# Patient Record
Sex: Female | Born: 1950
Health system: Southern US, Community
[De-identification: ages and names within clinical notes are randomized; demographics above are authoritative.]

## PROBLEM LIST (undated history)

## (undated) DIAGNOSIS — R7303 Prediabetes: Secondary | ICD-10-CM

## (undated) DIAGNOSIS — D369 Benign neoplasm, unspecified site: Secondary | ICD-10-CM

## (undated) DIAGNOSIS — G4730 Sleep apnea, unspecified: Secondary | ICD-10-CM

## (undated) DIAGNOSIS — E785 Hyperlipidemia, unspecified: Secondary | ICD-10-CM

## (undated) DIAGNOSIS — F329 Major depressive disorder, single episode, unspecified: Secondary | ICD-10-CM

## (undated) DIAGNOSIS — F32A Depression, unspecified: Secondary | ICD-10-CM

## (undated) HISTORY — DX: Prediabetes: R73.03

## (undated) HISTORY — DX: Major depressive disorder, single episode, unspecified: F32.9

## (undated) HISTORY — DX: Depression, unspecified: F32.A

## (undated) HISTORY — DX: Benign neoplasm, unspecified site: D36.9

## (undated) HISTORY — DX: Sleep apnea, unspecified: G47.30

## (undated) HISTORY — DX: Hyperlipidemia, unspecified: E78.5

---

## 2002-03-13 HISTORY — PX: TUBAL LIGATION: SHX77

## 2005-06-22 ENCOUNTER — Other Ambulatory Visit: Admission: RE | Admit: 2005-06-22 | Discharge: 2005-06-22 | Payer: Self-pay | Admitting: Internal Medicine

## 2005-07-11 ENCOUNTER — Encounter: Admission: RE | Admit: 2005-07-11 | Discharge: 2005-07-11 | Payer: Self-pay | Admitting: Internal Medicine

## 2006-06-29 ENCOUNTER — Other Ambulatory Visit: Admission: RE | Admit: 2006-06-29 | Discharge: 2006-06-29 | Payer: Self-pay | Admitting: Internal Medicine

## 2007-07-01 ENCOUNTER — Other Ambulatory Visit: Admission: RE | Admit: 2007-07-01 | Discharge: 2007-07-01 | Payer: Self-pay | Admitting: Internal Medicine

## 2007-08-12 ENCOUNTER — Encounter: Admission: RE | Admit: 2007-08-12 | Discharge: 2007-08-12 | Payer: Self-pay | Admitting: Internal Medicine

## 2008-01-20 ENCOUNTER — Ambulatory Visit: Payer: Self-pay | Admitting: Internal Medicine

## 2008-06-30 ENCOUNTER — Ambulatory Visit: Payer: Self-pay | Admitting: Internal Medicine

## 2009-01-15 ENCOUNTER — Encounter: Admission: RE | Admit: 2009-01-15 | Discharge: 2009-01-15 | Payer: Self-pay | Admitting: Internal Medicine

## 2009-01-27 ENCOUNTER — Encounter: Admission: RE | Admit: 2009-01-27 | Discharge: 2009-01-27 | Payer: Self-pay | Admitting: Internal Medicine

## 2009-02-09 ENCOUNTER — Other Ambulatory Visit: Admission: RE | Admit: 2009-02-09 | Discharge: 2009-02-09 | Payer: Self-pay | Admitting: Internal Medicine

## 2009-02-09 ENCOUNTER — Ambulatory Visit: Payer: Self-pay | Admitting: Internal Medicine

## 2009-06-29 ENCOUNTER — Ambulatory Visit: Payer: Self-pay | Admitting: Internal Medicine

## 2009-08-17 ENCOUNTER — Ambulatory Visit: Payer: Self-pay | Admitting: Internal Medicine

## 2010-02-22 ENCOUNTER — Ambulatory Visit: Payer: Self-pay | Admitting: Internal Medicine

## 2010-02-28 ENCOUNTER — Encounter
Admission: RE | Admit: 2010-02-28 | Discharge: 2010-02-28 | Payer: Self-pay | Source: Home / Self Care | Attending: Internal Medicine | Admitting: Internal Medicine

## 2010-03-21 HISTORY — PX: COLONOSCOPY: SHX174

## 2010-04-03 ENCOUNTER — Encounter: Payer: Self-pay | Admitting: Internal Medicine

## 2010-08-26 ENCOUNTER — Ambulatory Visit (INDEPENDENT_AMBULATORY_CARE_PROVIDER_SITE_OTHER): Payer: 59 | Admitting: Internal Medicine

## 2010-08-26 ENCOUNTER — Encounter: Payer: Self-pay | Admitting: Internal Medicine

## 2010-08-26 VITALS — BP 116/76 | HR 80 | Temp 98.4°F | Ht 64.0 in | Wt 143.5 lb

## 2010-08-26 DIAGNOSIS — H6120 Impacted cerumen, unspecified ear: Secondary | ICD-10-CM

## 2010-08-26 DIAGNOSIS — E559 Vitamin D deficiency, unspecified: Secondary | ICD-10-CM

## 2010-08-26 DIAGNOSIS — E785 Hyperlipidemia, unspecified: Secondary | ICD-10-CM

## 2010-08-26 NOTE — Patient Instructions (Signed)
Call if symptoms persist

## 2010-08-26 NOTE — Progress Notes (Signed)
  Subjective:    Patient ID: Dawn Herrera, female    DOB: 08-19-1950, 60 y.o.   MRN: [id number]  HPI Prior history of impacted cerumen in ears. Now c/o decreased hearing both ears right worse than left. C/o of sinus drainage.    Review of Systems     Objective:   Physical ExamImpacted cerumen right ear removed with currette. Slight beleding afterward in external canal. Cotton placed in ear. TMs chronically scarred.Cerumen removed from left external canal as well but not as much as right ear.       Assessment & Plan:  Impacted cerumen  Possible Otitis media  Plan Given script for Augmentin 500 mg po tid for 10 days should ear sxs persist.

## 2010-10-04 ENCOUNTER — Other Ambulatory Visit: Payer: 59 | Admitting: Internal Medicine

## 2010-10-04 DIAGNOSIS — E785 Hyperlipidemia, unspecified: Secondary | ICD-10-CM

## 2010-10-04 DIAGNOSIS — Z79899 Other long term (current) drug therapy: Secondary | ICD-10-CM

## 2010-10-04 DIAGNOSIS — Z0000 Encounter for general adult medical examination without abnormal findings: Secondary | ICD-10-CM

## 2010-10-04 LAB — COMPREHENSIVE METABOLIC PANEL
ALT: 20 U/L (ref 0–35)
AST: 25 U/L (ref 0–37)
Creat: 0.81 mg/dL (ref 0.50–1.10)
Total Bilirubin: 0.5 mg/dL (ref 0.3–1.2)

## 2010-10-04 LAB — LIPID PANEL
HDL: 55 mg/dL (ref >39)
LDL Cholesterol: 88 mg/dL (ref 0–99)
Triglycerides: 203 mg/dL — ABNORMAL HIGH (ref <150)
VLDL: 41 mg/dL — ABNORMAL HIGH (ref 0–40)

## 2010-10-04 LAB — CBC WITH DIFFERENTIAL/PLATELET
Basophils Absolute: 0 10*3/uL (ref 0.0–0.1)
Basophils Relative: 0 % (ref 0–1)
Hemoglobin: 15.2 g/dL — ABNORMAL HIGH (ref 12.0–15.0)
MCHC: 32.4 g/dL (ref 30.0–36.0)
Neutro Abs: 3.9 10*3/uL (ref 1.7–7.7)
Neutrophils Relative %: 56 % (ref 43–77)
RDW: 14.8 % (ref 11.5–15.5)

## 2010-10-04 LAB — TSH: TSH: 2.964 u[IU]/mL (ref 0.350–4.500)

## 2010-10-05 LAB — VITAMIN D 25 HYDROXY (VIT D DEFICIENCY, FRACTURES): Vit D, 25-Hydroxy: 48 ng/mL (ref 30–89)

## 2010-10-06 ENCOUNTER — Encounter: Payer: Self-pay | Admitting: Internal Medicine

## 2010-10-06 ENCOUNTER — Ambulatory Visit (INDEPENDENT_AMBULATORY_CARE_PROVIDER_SITE_OTHER): Payer: 59 | Admitting: Internal Medicine

## 2010-10-06 VITALS — BP 116/82 | HR 80 | Temp 97.7°F | Ht 63.25 in | Wt 141.0 lb

## 2010-10-06 DIAGNOSIS — Z860100 Personal history of colon polyps, unspecified: Secondary | ICD-10-CM

## 2010-10-06 DIAGNOSIS — B9689 Other specified bacterial agents as the cause of diseases classified elsewhere: Secondary | ICD-10-CM

## 2010-10-06 DIAGNOSIS — Z87891 Personal history of nicotine dependence: Secondary | ICD-10-CM

## 2010-10-06 DIAGNOSIS — E559 Vitamin D deficiency, unspecified: Secondary | ICD-10-CM

## 2010-10-06 DIAGNOSIS — Z8659 Personal history of other mental and behavioral disorders: Secondary | ICD-10-CM

## 2010-10-06 DIAGNOSIS — E785 Hyperlipidemia, unspecified: Secondary | ICD-10-CM

## 2010-10-06 DIAGNOSIS — N390 Urinary tract infection, site not specified: Secondary | ICD-10-CM

## 2010-10-06 DIAGNOSIS — A499 Bacterial infection, unspecified: Secondary | ICD-10-CM

## 2010-10-06 DIAGNOSIS — R319 Hematuria, unspecified: Secondary | ICD-10-CM

## 2010-10-06 DIAGNOSIS — Z0000 Encounter for general adult medical examination without abnormal findings: Secondary | ICD-10-CM

## 2010-10-06 LAB — POCT URINALYSIS DIPSTICK
Bilirubin, UA: NEGATIVE
Ketones, UA: NEGATIVE
Leukocytes, UA: NEGATIVE
pH, UA: 6.5

## 2010-10-07 LAB — URINALYSIS, ROUTINE W REFLEX MICROSCOPIC
Hgb urine dipstick: NEGATIVE
Leukocytes, UA: NEGATIVE
Nitrite: NEGATIVE
Urobilinogen, UA: 0.2 mg/dL (ref 0.0–1.0)
pH: 6.5 (ref 5.0–8.0)

## 2010-10-08 ENCOUNTER — Other Ambulatory Visit: Payer: Self-pay | Admitting: Internal Medicine

## 2010-10-08 DIAGNOSIS — N390 Urinary tract infection, site not specified: Secondary | ICD-10-CM

## 2010-10-08 LAB — URINE CULTURE

## 2010-10-08 MED ORDER — LEVOFLOXACIN 500 MG PO TABS: 500.0000 mg | ORAL_TABLET | Freq: Every day | ORAL | Status: AC

## 2010-10-10 ENCOUNTER — Encounter: Payer: Self-pay | Admitting: Internal Medicine
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
## 2010-10-11 ENCOUNTER — Encounter: Payer: Self-pay | Admitting: Internal Medicine

## 2010-10-11 DIAGNOSIS — Z86010 Personal history of colonic polyps: Secondary | ICD-10-CM | POA: Insufficient documentation

## 2010-10-11 DIAGNOSIS — E785 Hyperlipidemia, unspecified: Secondary | ICD-10-CM | POA: Insufficient documentation

## 2010-10-11 DIAGNOSIS — A499 Bacterial infection, unspecified: Secondary | ICD-10-CM | POA: Insufficient documentation

## 2010-10-11 DIAGNOSIS — Z87891 Personal history of nicotine dependence: Secondary | ICD-10-CM | POA: Insufficient documentation

## 2010-10-11 DIAGNOSIS — N390 Urinary tract infection, site not specified: Secondary | ICD-10-CM | POA: Insufficient documentation

## 2010-10-24 ENCOUNTER — Encounter: Payer: Self-pay | Admitting: Internal Medicine

## 2010-10-24 ENCOUNTER — Ambulatory Visit (INDEPENDENT_AMBULATORY_CARE_PROVIDER_SITE_OTHER): Payer: PRIVATE HEALTH INSURANCE | Admitting: Internal Medicine

## 2010-10-24 VITALS — Temp 97.6°F

## 2010-10-24 DIAGNOSIS — N390 Urinary tract infection, site not specified: Secondary | ICD-10-CM

## 2010-10-24 LAB — POCT URINALYSIS DIPSTICK
Bilirubin, UA: NEGATIVE
Glucose, UA: NEGATIVE
Ketones, UA: NEGATIVE
Leukocytes, UA: NEGATIVE
Nitrite, UA: NEGATIVE
Protein, UA: NEGATIVE
Spec Grav, UA: 1.005
Urobilinogen, UA: NEGATIVE
pH, UA: 6

## 2010-10-24 NOTE — Progress Notes (Signed)
Pt in today for a follow-up of an UTI. U/A dip WNL. Instructed pt to contact office for any further needs or concerns.

## 2011-01-10 ENCOUNTER — Encounter: Payer: Self-pay | Admitting: Pulmonary Disease

## 2011-01-10 ENCOUNTER — Ambulatory Visit (INDEPENDENT_AMBULATORY_CARE_PROVIDER_SITE_OTHER): Payer: PRIVATE HEALTH INSURANCE | Admitting: Pulmonary Disease

## 2011-01-10 ENCOUNTER — Telehealth: Payer: Self-pay | Admitting: Pulmonary Disease

## 2011-01-10 VITALS — BP 106/72 | HR 72 | Temp 98.3°F | Ht 64.0 in | Wt 144.4 lb

## 2011-01-10 DIAGNOSIS — G4733 Obstructive sleep apnea (adult) (pediatric): Secondary | ICD-10-CM

## 2011-01-10 NOTE — Progress Notes (Signed)
  Subjective:    Patient ID: Dawn Herrera, female    DOB: [birth year], 60 y.o.   MRN: [id number]  HPI The patient is a 60 year old female who I've been asked to see for possible sleep apnea.  She is been noted by her husband to have loud snoring, as well as an abnormal breathing pattern during sleep.  This was again mentioned after sharing a room with friends.  The patient describes nonrestorative sleep in the mornings upon arising, but also states that she is not "a morning person".  She stays very busy while at work, and does not have a lot of opportunity to become sleepy.  However, while at home on the weekends, she will become very sleepy anytime that she sits down.  She also dozes in the evening while trying to watch TV or movies.  She denies any issues with sleepiness while driving.  The patient states that her weight has been stable over the last few years, and her Epworth sleepiness score today is 12.  Sleep Questionnaire: What time do you typically go to bed?( Between what hours) 10 to 11:00 How long does it take you to fall asleep? 15 to 30 mins How many times during the night do you wake up? 2 What time do you get out of bed to start your day? 0715 Do you drive or operate heavy machinery in your occupation? No How much has your weight changed (up or down) over the past two years? (In pounds) 5 lb (2.268 kg) Have you ever had a sleep study before? No Do you currently use CPAP? No Do you wear oxygen at any time? No    Review of Systems  Constitutional: Negative for fever and unexpected weight change.  HENT: Positive for sneezing. Negative for ear pain, nosebleeds, congestion, sore throat, rhinorrhea, trouble swallowing, dental problem, postnasal drip and sinus pressure.   Eyes: Negative for redness and itching.  Respiratory: Positive for cough. Negative for chest tightness, shortness of breath and wheezing.   Cardiovascular: Positive for palpitations. Negative for leg swelling.    Gastrointestinal: Negative for nausea and vomiting.  Genitourinary: Negative for dysuria.  Musculoskeletal: Negative for joint swelling.  Skin: Negative for rash.  Neurological: Negative for headaches.  Hematological: Does not bruise/bleed easily.  Psychiatric/Behavioral: Negative for dysphoric mood. The patient is not nervous/anxious.        Objective:   Physical Exam Constitutional:  Well developed, no acute distress  HENT:  Nares patent without discharge, but very narrow bilat  Oropharynx without exudate, palate and uvula are elongated with narrowing posteriorly   Eyes:  Perrla, eomi, no scleral icterus  Neck:  No JVD, no TMG  Cardiovascular:  Normal rate, regular rhythm, no rubs or gallops.  No murmurs        Intact distal pulses  Pulmonary :  Normal breath sounds, no stridor or respiratory distress   No rales, rhonchi, or wheezing  Abdominal:  Soft, nondistended, bowel sounds present.  No tenderness noted.   Musculoskeletal:  No lower extremity edema noted.  Lymph Nodes:  No cervical lymphadenopathy noted  Skin:  No cyanosis noted  Neurologic:  Alert, appropriate, moves all 4 extremities without obvious deficit.         Assessment & Plan:

## 2011-01-10 NOTE — Assessment & Plan Note (Signed)
The patient's history is suggestive of obstructive sleep apnea.  She has loud snoring, witnessed apnea while sleeping, and clearly has abnormal daytime sleepiness with periods of inactivity.  She is not significantly overweight, but does have abnormal upper airway anatomy.  By her description, her father likely had sleep apnea.  I have had a long discussion with the pt about sleep apnea, including its impact on QOL and CV health.  I think there is enough suspicion to proceed with a formal sleep study, and the patient is agreeable.

## 2011-01-10 NOTE — Patient Instructions (Signed)
Will setup for sleep study, and will call you with the results.

## 2011-01-10 NOTE — Telephone Encounter (Signed)
Dawn Herrera, this pt needs to have Dr. Lenord Fellers listed as her primary care md.  The logo at top lists "none" I can't figure out how to do this.  Thanks.

## 2011-01-11 NOTE — Telephone Encounter (Signed)
Done

## 2011-02-05 ENCOUNTER — Ambulatory Visit (HOSPITAL_BASED_OUTPATIENT_CLINIC_OR_DEPARTMENT_OTHER): Payer: PRIVATE HEALTH INSURANCE | Attending: Pulmonary Disease | Admitting: General Practice

## 2011-02-05 VITALS — Ht 63.0 in | Wt 145.0 lb

## 2011-02-05 DIAGNOSIS — G4733 Obstructive sleep apnea (adult) (pediatric): Secondary | ICD-10-CM | POA: Insufficient documentation

## 2011-02-15 ENCOUNTER — Telehealth: Payer: Self-pay | Admitting: *Deleted

## 2011-02-15 DIAGNOSIS — R0609 Other forms of dyspnea: Secondary | ICD-10-CM

## 2011-02-15 DIAGNOSIS — G4733 Obstructive sleep apnea (adult) (pediatric): Secondary | ICD-10-CM

## 2011-02-15 DIAGNOSIS — R0989 Other specified symptoms and signs involving the circulatory and respiratory systems: Secondary | ICD-10-CM

## 2011-02-15 NOTE — Telephone Encounter (Signed)
Per KC, pt needs ov with him to discuss sleep study results.  LMOM for pt TCB

## 2011-02-15 NOTE — Procedures (Signed)
Dawn Herrera, Dawn Herrera         ACCOUNT NO.:  [id number]  MEDICAL RECORD NO.:  [id number]          PATIENT TYPE:  OUT  LOCATION:  SLEEP CENTER                 FACILITY:  [facility]  PHYSICIAN:  Barbaraann Share, MD,FCCPDATE OF BIRTH:  07-[birth year]1948  DATE OF STUDY:  02/05/2011                           NOCTURNAL POLYSOMNOGRAM  REFERRING PHYSICIAN:  Barbaraann Share, MD,FCCP  INDICATION FOR STUDY:  Hypersomnia with sleep apnea.  EPWORTH SLEEPINESS SCORE:  7.  MEDICATIONS:  SLEEP ARCHITECTURE:  The patient had a total sleep time of 332 minutes with no slow-wave sleep and only 38 minutes of REM. Sleep onset latency was normal at 14 minutes, and REM onset was prolonged at 201 minute. Sleep efficiency was mildly reduced at 86%.  RESPIRATORY DATA:  The patient was found to have 127 apneas and 12 obstructive hypopneas, giving her an apnea-hypopnea index of 25 events per hour. The events occurred more commonly in the supine position and also during REM. There was very loud snoring noted throughout.  OXYGEN DATA:  There was O2 desaturation as low as 80% with the patient's obstructive events.  CARDIAC DATA:  Occasional PVC noted, but no clinically significant arrhythmias were seen.  MOVEMENT-PARASOMNIA:  The patient had small numbers of leg jerks with no significant sleep disruption, and there were no behavioral abnormalities noted during the night.  IMPRESSIONS-RECOMMENDATIONS: 1. Moderate obstructive sleep apnea/hypopnea syndrome with an AHI of     25 events per hour, and O2 desaturation as low as 80%.  Treatment     for this degree of sleep apnea can include a trial of weight loss     alone, upper airway     surgery, dental appliance, and also CPAP.  Clinical correlation is     suggested. 2. Occasional PVC noted, but no clinically significant arrhythmias     were seen.     Barbaraann Share, MD,FCCP Diplomate, American Board of Sleep Medicine Electronically Signed    [facility]   D:  02/15/2011 08:05:13  T:  02/15/2011 08:13:41  Job:  [id number]

## 2011-02-17 NOTE — Telephone Encounter (Signed)
LM with family member for pt TCB

## 2011-02-20 ENCOUNTER — Other Ambulatory Visit: Payer: Self-pay | Admitting: Oncology

## 2011-02-21 NOTE — Telephone Encounter (Signed)
LM with family member again for pt Dawn Herrera.

## 2011-02-22 NOTE — Telephone Encounter (Signed)
Patient returning call.  Made appt 03/20/11 to discuss sleep results.  Patient aware.

## 2011-03-15 ENCOUNTER — Ambulatory Visit (INDEPENDENT_AMBULATORY_CARE_PROVIDER_SITE_OTHER): Payer: PRIVATE HEALTH INSURANCE | Admitting: Internal Medicine

## 2011-03-15 ENCOUNTER — Ambulatory Visit
Admission: RE | Admit: 2011-03-15 | Discharge: 2011-03-15 | Disposition: A | Discharge status: Home / Self Care | Payer: PRIVATE HEALTH INSURANCE | Source: Ambulatory Visit | Attending: Internal Medicine | Admitting: Internal Medicine

## 2011-03-15 ENCOUNTER — Encounter: Payer: Self-pay | Admitting: Internal Medicine

## 2011-03-15 VITALS — BP 120/84 | HR 72 | Temp 97.4°F | Ht 64.0 in | Wt 132.0 lb

## 2011-03-15 DIAGNOSIS — J449 Chronic obstructive pulmonary disease, unspecified: Secondary | ICD-10-CM

## 2011-03-15 DIAGNOSIS — J9801 Acute bronchospasm: Secondary | ICD-10-CM

## 2011-03-15 DIAGNOSIS — J40 Bronchitis, not specified as acute or chronic: Secondary | ICD-10-CM

## 2011-03-20 ENCOUNTER — Ambulatory Visit (INDEPENDENT_AMBULATORY_CARE_PROVIDER_SITE_OTHER): Payer: PRIVATE HEALTH INSURANCE | Admitting: Pulmonary Disease

## 2011-03-20 ENCOUNTER — Encounter: Payer: Self-pay | Admitting: Pulmonary Disease

## 2011-03-20 VITALS — BP 110/72 | HR 80 | Temp 98.1°F | Ht 63.0 in | Wt 143.8 lb

## 2011-03-20 DIAGNOSIS — G4733 Obstructive sleep apnea (adult) (pediatric): Secondary | ICD-10-CM

## 2011-03-20 NOTE — Progress Notes (Signed)
  Subjective:    Patient ID: Dawn Herrera, female    DOB: [birth year], 61 y.o.   MRN: [id number]  HPI The patient comes in today for followup of her recent sleep study.  She was found to have moderate OSA, with an AHI of 25 events per hour.  I have reviewed this study with her in great detail, and answered all of her questions.   Review of Systems  Constitutional: Negative for fever and unexpected weight change.  HENT: Positive for congestion, rhinorrhea, sneezing, postnasal drip and sinus pressure. Negative for ear pain, nosebleeds, sore throat, trouble swallowing and dental problem.   Eyes: Negative for redness and itching.  Respiratory: Positive for cough. Negative for chest tightness, shortness of breath and wheezing.   Cardiovascular: Negative for palpitations and leg swelling.  Gastrointestinal: Negative for nausea and vomiting.  Genitourinary: Negative for dysuria.  Musculoskeletal: Negative for joint swelling.  Skin: Negative for rash.  Neurological: Negative for headaches.  Hematological: Does not bruise/bleed easily.  Psychiatric/Behavioral: Negative for dysphoric mood. The patient is not nervous/anxious.        Objective:   Physical Exam Well-developed female in no acute distress Nose without purulence or discharge noted Lower extremities without edema, no cyanosis Alert and oriented, moves all 4 extremities.       Assessment & Plan:

## 2011-03-20 NOTE — Patient Instructions (Signed)
Will get you referred to Dr. Myrtis Ser for dental evaluation. Work on modest weight loss Let me know if you would like to consider cpap.

## 2011-03-20 NOTE — Assessment & Plan Note (Signed)
The patient has moderate obstructive sleep apnea by her recent sleep study.  I have reviewed the various treatment options with her, including a trial of weight loss alone, upper airway surgery, dental appliance, and also CPAP.  She really does not have a lot of weight to lose, but she could work on this over time.  She does not want to consider surgery, and is hesitant to try CPAP because of how it may alter her appearance in the morning hours.  She would like to see dental medicine for a possible oral appliance.

## 2011-04-10 ENCOUNTER — Ambulatory Visit (INDEPENDENT_AMBULATORY_CARE_PROVIDER_SITE_OTHER): Payer: PRIVATE HEALTH INSURANCE | Admitting: Internal Medicine

## 2011-04-10 ENCOUNTER — Encounter: Payer: Self-pay | Admitting: Internal Medicine

## 2011-04-10 DIAGNOSIS — E7800 Pure hypercholesterolemia, unspecified: Secondary | ICD-10-CM

## 2011-04-10 DIAGNOSIS — Z23 Encounter for immunization: Secondary | ICD-10-CM

## 2011-04-10 DIAGNOSIS — Z79899 Other long term (current) drug therapy: Secondary | ICD-10-CM

## 2011-04-10 DIAGNOSIS — Z2911 Encounter for prophylactic immunotherapy for respiratory syncytial virus (RSV): Secondary | ICD-10-CM

## 2011-04-10 DIAGNOSIS — Z87891 Personal history of nicotine dependence: Secondary | ICD-10-CM

## 2011-04-10 LAB — LIPID PANEL
LDL Cholesterol: 83 mg/dL (ref 0–99)
Triglycerides: 216 mg/dL — ABNORMAL HIGH (ref <150)

## 2011-04-10 LAB — HEPATIC FUNCTION PANEL
Alkaline Phosphatase: 99 U/L (ref 39–117)
Bilirubin, Direct: 0.1 mg/dL (ref 0.0–0.3)
Indirect Bilirubin: 0.3 mg/dL (ref 0.0–0.9)
Total Bilirubin: 0.4 mg/dL (ref 0.3–1.2)

## 2011-05-14 NOTE — Progress Notes (Signed)
  Subjective:    Patient ID: Dawn Herrera, female    DOB: Sep 03, 1950, 61 y.o.   MRN: [id number]  HPI 61 year old white female smoker in today with URI symptoms coughing and wheezing with some shortness of breath. History of hyperlipidemia, vitamin D deficiency and history of adenomatous colon polyp. Likely has an element of COPD. Had tubal ligation 1994. Gets annual influenza immunization through her employer. Has had discolored sputum production. No shaking chills. Low-grade fever.      Review of Systems     Objective:   Physical Exam HEENT exam: TMs are clear pharynx is clear. Neck is supple without significant adenopathy. Chest bilateral rhonchi with slight wheezing         Assessment & Plan:   Bronchitis  COPD  Plan: Sterapred DS 10 mg 6 day dosepak. Levaquin 500 mg daily for 10 days. Patient wants to have chest x-ray to evaluate cough with history of heavy smoking. Patient is not ready to quit smoking. Addendum chest x-ray is negative

## 2011-05-14 NOTE — Patient Instructions (Signed)
Take antibiotics as prescribed. Take Sterapred DS 10 mg dosepak as prescribed. Call if not better in 48 hours or sooner if worse.

## 2011-05-15 NOTE — Progress Notes (Signed)
  Subjective:    Patient ID: Dawn Herrera, female    DOB: [birth year], 61 y.o.   MRN: [id number]  HPI patient was here in early January with bronchitis and bronchospasm. She improved with steroids and antibiotics. Now here for followup for six-month recheck on hyperlipidemia. Patient is compliant with statin medication. Had lipid panel drawn today fasting. No complaints or problems.    Review of Systems     Objective:   Physical Exam chest clear to auscultation; cardiac exam: regular rate and rhythm normal S1 and S2; extremities: without edema        Assessment & Plan:  Hyperlipidemia  COPD  History of smoking  Recent acute episode of bronchospasm and bronchitis  Plan: Return in 6 months for physical exam. Continue same dose of statin medication.

## 2011-05-15 NOTE — Patient Instructions (Signed)
Continue same dose of statin medication. Return in 6 months for physical exam.

## 2011-08-15 ENCOUNTER — Telehealth: Payer: Self-pay | Admitting: Internal Medicine

## 2011-08-16 ENCOUNTER — Other Ambulatory Visit: Payer: Self-pay

## 2011-08-16 MED ORDER — ROSUVASTATIN CALCIUM 10 MG PO TABS: 10.0000 mg | ORAL_TABLET | Freq: Every day | ORAL | Status: DC

## 2011-08-16 NOTE — Telephone Encounter (Signed)
OK to refill. When is next appt due for recheck?

## 2011-10-06 ENCOUNTER — Other Ambulatory Visit: Payer: PRIVATE HEALTH INSURANCE | Admitting: Internal Medicine

## 2011-10-06 DIAGNOSIS — Z0000 Encounter for general adult medical examination without abnormal findings: Secondary | ICD-10-CM

## 2011-10-06 LAB — CBC WITH DIFFERENTIAL/PLATELET
Basophils Relative: 0 % (ref 0–1)
Eosinophils Absolute: 0.1 10*3/uL (ref 0.0–0.7)
HCT: 41.6 % (ref 36.0–46.0)
Hemoglobin: 14 g/dL (ref 12.0–15.0)
Lymphs Abs: 2.1 10*3/uL (ref 0.7–4.0)
MCH: 28.8 pg (ref 26.0–34.0)
MCHC: 33.7 g/dL (ref 30.0–36.0)
Monocytes Absolute: 0.6 10*3/uL (ref 0.1–1.0)
Monocytes Relative: 11 % (ref 3–12)
RBC: 4.86 MIL/uL (ref 3.87–5.11)

## 2011-10-06 LAB — COMPREHENSIVE METABOLIC PANEL
Albumin: 4.3 g/dL (ref 3.5–5.2)
Alkaline Phosphatase: 94 U/L (ref 39–117)
BUN: 13 mg/dL (ref 6–23)
CO2: 27 mEq/L (ref 19–32)
Glucose, Bld: 88 mg/dL (ref 70–99)
Potassium: 4.3 mEq/L (ref 3.5–5.3)
Total Bilirubin: 0.5 mg/dL (ref 0.3–1.2)
Total Protein: 6.4 g/dL (ref 6.0–8.3)

## 2011-10-06 LAB — LIPID PANEL
Cholesterol: 167 mg/dL (ref 0–200)
HDL: 44 mg/dL (ref >39)
LDL Cholesterol: 86 mg/dL (ref 0–99)
Triglycerides: 185 mg/dL — ABNORMAL HIGH (ref <150)

## 2011-10-09 ENCOUNTER — Ambulatory Visit (INDEPENDENT_AMBULATORY_CARE_PROVIDER_SITE_OTHER): Payer: PRIVATE HEALTH INSURANCE | Admitting: Internal Medicine

## 2011-10-09 ENCOUNTER — Encounter: Payer: Self-pay | Admitting: Internal Medicine

## 2011-10-09 VITALS — BP 114/74 | HR 76 | Temp 97.1°F | Ht 63.5 in | Wt 138.0 lb

## 2011-10-09 DIAGNOSIS — Z0000 Encounter for general adult medical examination without abnormal findings: Secondary | ICD-10-CM

## 2011-10-09 DIAGNOSIS — Z124 Encounter for screening for malignant neoplasm of cervix: Secondary | ICD-10-CM

## 2011-10-09 DIAGNOSIS — R311 Benign essential microscopic hematuria: Secondary | ICD-10-CM

## 2011-10-09 DIAGNOSIS — L299 Pruritus, unspecified: Secondary | ICD-10-CM

## 2011-10-09 DIAGNOSIS — Z87891 Personal history of nicotine dependence: Secondary | ICD-10-CM

## 2011-10-09 DIAGNOSIS — E785 Hyperlipidemia, unspecified: Secondary | ICD-10-CM

## 2011-10-09 DIAGNOSIS — Z8669 Personal history of other diseases of the nervous system and sense organs: Secondary | ICD-10-CM

## 2011-10-09 DIAGNOSIS — R3129 Other microscopic hematuria: Secondary | ICD-10-CM

## 2011-10-09 DIAGNOSIS — F17200 Nicotine dependence, unspecified, uncomplicated: Secondary | ICD-10-CM

## 2011-10-09 LAB — POCT URINALYSIS DIPSTICK
Bilirubin, UA: NEGATIVE
Ketones, UA: NEGATIVE
Leukocytes, UA: NEGATIVE
Protein, UA: NEGATIVE
Spec Grav, UA: 1.01

## 2011-10-09 NOTE — Patient Instructions (Addendum)
Try Chantix starter pack to quit smoking. Refill Crestor for one year. Samples of Crestor given. Return in 6 months for office visit lipid panel liver functions. Obtain mammogram and bone density study.

## 2011-10-09 NOTE — Progress Notes (Signed)
Subjective:    Patient ID: Dawn Herrera, female    DOB: [birth year], 61 y.o.   MRN: [id number]  HPI 61 year old white female with history of smoking for many years, hyperlipidemia, obstructive sleep apnea, bouts of bronchitis from time to time, vitamin D deficiency, mild osteopenia in today for health maintenance exam. She is interested in trying Chantix to quit smoking. Long-standing history of smoking. History of hyperlipidemia, depression, vitamin D deficiency in adenomatous colon polyp. Had colonoscopy in 2007 by Dr. Bosie Clos. She had one small adenomatous polyp in the descending colon. Had repeat colonoscopy 03/21/2010 showing hyperplastic polyps.. Saw Dr. Teressa Senter in 2009 for stage 2 or 3 impingement right shoulder with mild adhesive capsulitis. History of microscopic hematuria seen by Dr. Darvin Neighbours in 2007 with normal cystoscopy ureteroscopy. Had bone density study in 2010 showing mild osteopenia. Tubal ligation 1994. No known drug allergies.  She is married. Completed one-year college. Husband is retired. Has smoked one half to one pack per day for over 30 years. Has about 2 alcoholic drinks per week. 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 2 adult daughters.  Father died at age 71 of congestive heart failure. Mother with history of coronary artery disease, MI, status post CABG.. One brother in good health. 4 sisters in good health.      Review of Systems     Objective:   Physical Exam  Vitals reviewed. Constitutional: She is oriented to person, place, and time. She appears well-developed and well-nourished. No distress.  HENT:  Head: Normocephalic and atraumatic.  Right Ear: External ear normal.  Left Ear: External ear normal.  Mouth/Throat: Oropharynx is clear and moist. No oropharyngeal exudate.  Eyes: Conjunctivae and EOM are normal. Pupils are equal, round, and reactive to light. Right eye exhibits no discharge. Left eye exhibits no discharge. No  scleral icterus.  Neck: Normal range of motion. Neck supple. No JVD present. No thyromegaly present.  Cardiovascular: Normal rate, regular rhythm, normal heart sounds and intact distal pulses.   No murmur heard. Pulmonary/Chest: No respiratory distress. She has no wheezes. She has no rales. She exhibits no tenderness.       Breasts normal female without masses  Abdominal: Soft. Bowel sounds are normal. She exhibits no distension and no mass. There is no tenderness. There is no guarding.  Genitourinary: Vagina normal and uterus normal.       Pap smear obtained- bimanual exam is normal  Musculoskeletal: She exhibits no edema.  Lymphadenopathy:    She has no cervical adenopathy.  Neurological: She is alert and oriented to person, place, and time. She has normal reflexes. No cranial nerve deficit. Coordination normal.  Skin: Skin is warm and dry. No rash noted. She is not diaphoretic.  Psychiatric: She has a normal mood and affect. Her behavior is normal. Judgment and thought content normal.          Assessment & Plan:  Hyperlipidemia-improved from previous readings 6 months ago. Triglyceride level has improved. Continue Crestor.  History of smoking-prescription written for Chantix starter pack. If she is unable to tolerate it will refill with regular Chantix dosepak. Side effects explained. Smoking cessation counseling done. History of obstructive sleep apnea  history of adenomatous colon polyp on colonoscopy 2007. Recent colonoscopy 2012 showed only hyperplastic polyps  History of urinary tract infections  History of intermittent bouts of bronchitis-likely has element of COPD  Plan: Return in 6 months for office visit, lipid panel, liver functions. Order given for  bone density study. Last one was done in 2010. Order given for mammogram. Complains of itching ears. Prescribed fluocinolone 0 .01%  topical solution to use in the ear canals twice a week

## 2011-10-10 ENCOUNTER — Other Ambulatory Visit (HOSPITAL_COMMUNITY)
Admission: RE | Admit: 2011-10-10 | Discharge: 2011-10-10 | Disposition: A | Discharge status: Home / Self Care | Payer: PRIVATE HEALTH INSURANCE | Source: Ambulatory Visit | Attending: Internal Medicine | Admitting: Internal Medicine

## 2011-10-10 ENCOUNTER — Other Ambulatory Visit: Payer: Self-pay | Admitting: Internal Medicine

## 2011-10-10 DIAGNOSIS — Z780 Asymptomatic menopausal state: Secondary | ICD-10-CM

## 2011-10-10 DIAGNOSIS — Z01419 Encounter for gynecological examination (general) (routine) without abnormal findings: Secondary | ICD-10-CM | POA: Insufficient documentation

## 2011-10-10 DIAGNOSIS — R311 Benign essential microscopic hematuria: Secondary | ICD-10-CM | POA: Insufficient documentation

## 2011-10-10 NOTE — Addendum Note (Signed)
Addended by: Judy Pimple on: 10/10/2011 09:32 AM   Modules accepted: Orders

## 2012-03-28 ENCOUNTER — Ambulatory Visit (INDEPENDENT_AMBULATORY_CARE_PROVIDER_SITE_OTHER): Payer: PRIVATE HEALTH INSURANCE | Admitting: Internal Medicine

## 2012-03-28 ENCOUNTER — Other Ambulatory Visit: Payer: Self-pay | Admitting: Internal Medicine

## 2012-03-28 ENCOUNTER — Encounter: Payer: Self-pay | Admitting: Internal Medicine

## 2012-03-28 VITALS — BP 120/78 | Temp 97.9°F | Wt 142.0 lb

## 2012-03-28 DIAGNOSIS — Z780 Asymptomatic menopausal state: Secondary | ICD-10-CM

## 2012-03-28 DIAGNOSIS — Z87891 Personal history of nicotine dependence: Secondary | ICD-10-CM

## 2012-03-28 DIAGNOSIS — Z8669 Personal history of other diseases of the nervous system and sense organs: Secondary | ICD-10-CM

## 2012-03-28 DIAGNOSIS — J449 Chronic obstructive pulmonary disease, unspecified: Secondary | ICD-10-CM

## 2012-03-28 DIAGNOSIS — Z87898 Personal history of other specified conditions: Secondary | ICD-10-CM

## 2012-03-28 DIAGNOSIS — Z1231 Encounter for screening mammogram for malignant neoplasm of breast: Secondary | ICD-10-CM

## 2012-03-28 DIAGNOSIS — J40 Bronchitis, not specified as acute or chronic: Secondary | ICD-10-CM

## 2012-03-28 MED ORDER — CEFTRIAXONE SODIUM 1 G IJ SOLR
1.0000 g | Freq: Once | INTRAMUSCULAR | Status: AC
  Administered 2012-03-28: 1 g via INTRAMUSCULAR

## 2012-03-28 MED ORDER — METHYLPREDNISOLONE ACETATE 80 MG/ML IJ SUSP
80.0000 mg | Freq: Once | INTRAMUSCULAR | Status: AC
  Administered 2012-03-28: 80 mg via INTRAMUSCULAR

## 2012-03-29 NOTE — Progress Notes (Signed)
  Subjective:    Patient ID: Dawn Herrera, female    DOB: 12-Aug-1950, 62 y.o.   MRN: [id number]  HPI 62 year old white female with one week history of URI symptoms. No fever or shaking chills. History of smoking and has not been willing to quit in the past. Had similar episode January 2013. Not wheezing or short of breath this time. A lot of coughing and congestion. Does not feel she needs nebulizer treatments or inhaler. Has not missed any work. Had negative chest x-ray January 2013.    Review of Systems     Objective:   Physical Exam HEENT exam: Pharynx is clear. TMs are clear. Sounds nasally congested. Neck is supple without adenopathy. Chest clear without rales or wheezing. Coughing a lot in the office and cough is deep and congested.       Assessment & Plan:  Bronchitis  COPD  History of smoking-counseled regarding smoking cessation. Patient unwilling to quit at this time.  History of sleep apnea-patient wearing the device at night provided by Dentist which seems to help a great deal. She never required CPAP apparatus.  Plan: Depo-Medrol 80 mg IM. Patient not willing to take course oral steroids. Rocephin 1 g IM. Levaquin 500 milligrams daily for 10 days. Hycodan 8 ounces 1 teaspoon by mouth every 6 hours when necessary cough.

## 2012-03-29 NOTE — Patient Instructions (Addendum)
Please stop smoking. Take Levaquin 500 milligrams daily for 10 days. You have received Depo-Medrol IM and Rocephin IM today for bronchitis. Call if symptoms worsen or do not improve in the next 7 days.

## 2012-04-12 ENCOUNTER — Other Ambulatory Visit: Payer: PRIVATE HEALTH INSURANCE | Admitting: Internal Medicine

## 2012-04-12 DIAGNOSIS — Z79899 Other long term (current) drug therapy: Secondary | ICD-10-CM

## 2012-04-12 DIAGNOSIS — E785 Hyperlipidemia, unspecified: Secondary | ICD-10-CM

## 2012-04-12 LAB — HEPATIC FUNCTION PANEL
ALT: 20 U/L (ref 0–35)
Alkaline Phosphatase: 89 U/L (ref 39–117)
Bilirubin, Direct: 0.1 mg/dL (ref 0.0–0.3)
Indirect Bilirubin: 0.4 mg/dL (ref 0.0–0.9)

## 2012-04-12 LAB — LIPID PANEL
Cholesterol: 160 mg/dL (ref 0–200)
VLDL: 24 mg/dL (ref 0–40)

## 2012-04-15 ENCOUNTER — Ambulatory Visit (INDEPENDENT_AMBULATORY_CARE_PROVIDER_SITE_OTHER): Payer: PRIVATE HEALTH INSURANCE | Admitting: Internal Medicine

## 2012-04-15 ENCOUNTER — Encounter: Payer: Self-pay | Admitting: Internal Medicine

## 2012-04-15 VITALS — BP 124/72 | HR 76 | Temp 97.6°F | Wt 141.0 lb

## 2012-04-15 DIAGNOSIS — R319 Hematuria, unspecified: Secondary | ICD-10-CM

## 2012-04-15 DIAGNOSIS — F17200 Nicotine dependence, unspecified, uncomplicated: Secondary | ICD-10-CM

## 2012-04-15 DIAGNOSIS — M549 Dorsalgia, unspecified: Secondary | ICD-10-CM

## 2012-04-15 DIAGNOSIS — J449 Chronic obstructive pulmonary disease, unspecified: Secondary | ICD-10-CM

## 2012-04-15 LAB — POCT URINALYSIS DIPSTICK
Glucose, UA: NEGATIVE
Ketones, UA: NEGATIVE
Protein, UA: NEGATIVE
Spec Grav, UA: 1.015

## 2012-04-15 NOTE — Patient Instructions (Signed)
Use Premarin vaginal cream 3 times weekly for dyspareunia. Advise if no improvement after 3 months. Consider course of steroids for persistent bronchitis symptoms. Stop smoking. Continue Crestor 10 mg daily. Return in 6 months.

## 2012-04-15 NOTE — Progress Notes (Signed)
  Subjective:    Patient ID: Dawn Herrera, female    DOB: 06/18/1950, 62 y.o.   MRN: [id number]  HPI In today for followup on hyperlipidemia. Is on Crestor 10 mg daily. Lipid panel and liver functions are excellent on this dose of medication. Samples were provided. She was here January 16 for bronchitis treated with Rocephin 1 g IM, Depo-Medrol 80 mg IM, Levaquin 500 milligrams daily for 10 days and Hycodan cough syrup. Still complaining of cough. Offered to give her course of steroids but she declined. Says she doesn't tolerate steroids well. She continues to smoke a half pack per day. Talked with her about smoking cessation. Spoke with her about it at last visit he gave her prescription for Chantix which she said was very expensive on her insurance plan. Today I gave her a discount card for Chantix. She needs to quit smoking. Also having some issues with dyspareunia. She is asking about a new medication she heard about on television which I am not familiar with. Lubricants do not work for her. Recommended instead Premarin vaginal cream 1 applicator full in vagina 3 times weekly with when necessary one year refills.    Review of Systems     Objective:   Physical Exam Neck is supple without JVD thyromegaly or carotid bruits. Chest clear to auscultation. Cardiac exam regular rate and rhythm. Skin is warm and dry. Discussed dyspareunia at length.        Assessment & Plan:  COPD- status post treatment for bronchitis. Patient will call if symptoms persist. Smoking cessation counseling.  Hyperlipidemia-stable on Crestor 10 mg daily  History of smoking-try Chantix starter pack-discount card given today  Dyspareunia-try Premarin vaginal cream 1 applicator full in vagina each bedtime 3 times weekly.  Plan: Return in 6 months for physical examination.  25 minutes spent with patient on these issues

## 2012-04-16 LAB — URINALYSIS, MICROSCOPIC ONLY
Crystals: NONE SEEN
Squamous Epithelial / LPF: NONE SEEN

## 2012-04-30 ENCOUNTER — Ambulatory Visit
Admission: RE | Admit: 2012-04-30 | Discharge: 2012-04-30 | Disposition: A | Discharge status: Home / Self Care | Payer: PRIVATE HEALTH INSURANCE | Source: Ambulatory Visit | Attending: Internal Medicine | Admitting: Internal Medicine

## 2012-04-30 DIAGNOSIS — Z780 Asymptomatic menopausal state: Secondary | ICD-10-CM

## 2012-04-30 DIAGNOSIS — Z1231 Encounter for screening mammogram for malignant neoplasm of breast: Secondary | ICD-10-CM

## 2012-10-10 ENCOUNTER — Other Ambulatory Visit: Payer: PRIVATE HEALTH INSURANCE | Admitting: Internal Medicine

## 2012-10-10 DIAGNOSIS — E785 Hyperlipidemia, unspecified: Secondary | ICD-10-CM

## 2012-10-10 DIAGNOSIS — E559 Vitamin D deficiency, unspecified: Secondary | ICD-10-CM

## 2012-10-10 DIAGNOSIS — Z1329 Encounter for screening for other suspected endocrine disorder: Secondary | ICD-10-CM

## 2012-10-10 DIAGNOSIS — Z130 Encounter for screening for diseases of the blood and blood-forming organs and certain disorders involving the immune mechanism: Secondary | ICD-10-CM

## 2012-10-10 DIAGNOSIS — Z79899 Other long term (current) drug therapy: Secondary | ICD-10-CM

## 2012-10-10 LAB — CBC WITH DIFFERENTIAL/PLATELET
Basophils Absolute: 0 10*3/uL (ref 0.0–0.1)
Eosinophils Absolute: 0.1 10*3/uL (ref 0.0–0.7)
Eosinophils Relative: 2 % (ref 0–5)
HCT: 43.4 % (ref 36.0–46.0)
Lymphocytes Relative: 34 % (ref 12–46)
Lymphs Abs: 2.3 10*3/uL (ref 0.7–4.0)
MCH: 29.8 pg (ref 26.0–34.0)
MCV: 88 fL (ref 78.0–100.0)
Monocytes Absolute: 0.6 10*3/uL (ref 0.1–1.0)
Platelets: 239 10*3/uL (ref 150–400)
RDW: 14.2 % (ref 11.5–15.5)
WBC: 6.8 10*3/uL (ref 4.0–10.5)

## 2012-10-10 LAB — COMPREHENSIVE METABOLIC PANEL
BUN: 14 mg/dL (ref 6–23)
CO2: 29 mEq/L (ref 19–32)
Calcium: 9.7 mg/dL (ref 8.4–10.5)
Chloride: 103 mEq/L (ref 96–112)
Creat: 0.87 mg/dL (ref 0.50–1.10)
Total Bilirubin: 0.4 mg/dL (ref 0.3–1.2)

## 2012-10-10 LAB — LIPID PANEL
Cholesterol: 188 mg/dL (ref 0–200)
HDL: 52 mg/dL (ref >39)
Total CHOL/HDL Ratio: 3.6 Ratio
VLDL: 45 mg/dL — ABNORMAL HIGH (ref 0–40)

## 2012-10-11 ENCOUNTER — Encounter: Payer: Self-pay | Admitting: Internal Medicine

## 2012-10-11 ENCOUNTER — Ambulatory Visit (INDEPENDENT_AMBULATORY_CARE_PROVIDER_SITE_OTHER): Payer: PRIVATE HEALTH INSURANCE | Admitting: Internal Medicine

## 2012-10-11 VITALS — BP 134/86 | HR 84 | Ht 63.75 in | Wt 150.0 lb

## 2012-10-11 DIAGNOSIS — E785 Hyperlipidemia, unspecified: Secondary | ICD-10-CM

## 2012-10-11 DIAGNOSIS — G4733 Obstructive sleep apnea (adult) (pediatric): Secondary | ICD-10-CM

## 2012-10-11 DIAGNOSIS — M899 Disorder of bone, unspecified: Secondary | ICD-10-CM

## 2012-10-11 DIAGNOSIS — Z8709 Personal history of other diseases of the respiratory system: Secondary | ICD-10-CM

## 2012-10-11 DIAGNOSIS — R3129 Other microscopic hematuria: Secondary | ICD-10-CM

## 2012-10-11 DIAGNOSIS — M8580 Other specified disorders of bone density and structure, unspecified site: Secondary | ICD-10-CM

## 2012-10-11 DIAGNOSIS — Z86010 Personal history of colonic polyps: Secondary | ICD-10-CM

## 2012-10-11 DIAGNOSIS — Z0000 Encounter for general adult medical examination without abnormal findings: Secondary | ICD-10-CM

## 2012-10-11 DIAGNOSIS — Z87891 Personal history of nicotine dependence: Secondary | ICD-10-CM

## 2012-10-11 LAB — POCT URINALYSIS DIPSTICK
Bilirubin, UA: NEGATIVE
Glucose, UA: NEGATIVE
Ketones, UA: NEGATIVE
Spec Grav, UA: <=1.005
pH, UA: 7

## 2012-10-12 DIAGNOSIS — E782 Mixed hyperlipidemia: Secondary | ICD-10-CM | POA: Insufficient documentation

## 2012-10-12 DIAGNOSIS — M8580 Other specified disorders of bone density and structure, unspecified site: Secondary | ICD-10-CM | POA: Insufficient documentation

## 2012-10-12 DIAGNOSIS — E785 Hyperlipidemia, unspecified: Secondary | ICD-10-CM | POA: Insufficient documentation

## 2012-10-12 DIAGNOSIS — Z8709 Personal history of other diseases of the respiratory system: Secondary | ICD-10-CM | POA: Insufficient documentation

## 2012-10-12 NOTE — Patient Instructions (Addendum)
Continue same medications including Crestor for hyperlipidemia. Watch diet and exercise. Return in 6 months for office visit lipid panel liver functions. Please try to quit smoking. Please try Chantix.

## 2012-10-12 NOTE — Progress Notes (Signed)
Subjective:    Patient ID: Dawn Herrera, female    DOB: February 24, 1951, 62 y.o.   MRN: [id number]  HPI 61 year old White female with history of smoking, hyperlipidemia, microscopic hematuria for health maintenance and evaluation of medical problems. Think she wants to try to quit smoking but hears  Chantix  is expensive. & Gave her samples of Chantix to try today. Currently on Crestor for hyperlipidemia. History of vitamin D deficiency and adenomatous colon polyp. Had colonoscopy in 2007 by Dr. Bosie Clos. Had one small adenomatous polyp in the descending colon and had repeat colonoscopy January 2012 showing hyperplastic polyps. History of microscopic hematuria seen previously by Dr. Darvin Neighbours in 2007 with normal cystoscopically and ureteroscopy.  History of obstructive sleep apnea. Bone density study done January 2014. Mammogram done 2014.  Saw Dr. Teressa Senter in 2009 for Impingement of right shoulder with mild adhesive capsulitis. History of bone density study 2010 showing mild osteopenia. Tubal ligation 1994. No known drug allergies.  Social history: She is married. Completed one year of college. Husband is retired. She has smoked a half to one pack per day for over 30 years. Currently smoking about a half pack daily. Has about 2 alcoholic drinks per week. 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 2 adult daughters.  Family history: Father died at 71 of congestive heart failure. Mother with history of coronary artery disease, MI, status post CABG. One brother in good health. 4 sisters in good health.      Review of Systems  Constitutional: Negative.   All other systems reviewed and are negative.       Objective:   Physical Exam  Vitals reviewed. Constitutional: She is oriented to person, place, and time. She appears well-developed and well-nourished. No distress.  HENT:  Head: Normocephalic and atraumatic.  Right Ear: External ear normal.  Left Ear: External ear  normal.  Mouth/Throat: Oropharynx is clear and moist. No oropharyngeal exudate.  Eyes: Conjunctivae and EOM are normal. Pupils are equal, round, and reactive to light. Right eye exhibits no discharge. Left eye exhibits no discharge. No scleral icterus.  Neck: Neck supple. No JVD present. No thyromegaly present.  Cardiovascular: Normal rate, regular rhythm, normal heart sounds and intact distal pulses.   No murmur heard. Pulmonary/Chest: Effort normal and breath sounds normal. No respiratory distress. She has no rales. She exhibits no tenderness.  Breasts normal female  Abdominal: Soft. Bowel sounds are normal. She exhibits no distension and no mass. There is no rebound.  Genitourinary:  Pap smear done 2013. Bimanual exam normal.  Musculoskeletal: She exhibits no edema.  Lymphadenopathy:    She has no cervical adenopathy.  Neurological: She is alert and oriented to person, place, and time. She has normal reflexes. No cranial nerve deficit. Coordination normal.  Skin: Skin is warm and dry. No rash noted. She is not diaphoretic.  Psychiatric: She has a normal mood and affect. Her behavior is normal. Judgment and thought content normal.          Assessment & Plan:  Hyperlipidemia-stable on Crestor  History of smoking-start on Chantix  Microscopic hematuria-stable and previously evaluated  History of adenomatous colon polyps last colonoscopy 2012  History of mild osteopenia-bone density study done January 2014 see results  History of intermittent bouts of bronchitis and likely has element of COPD  History of obstructive sleep apnea  Plan: Continue same medications and return in 6 months. New prescription for Chantix with saving card. In 6 months we'll have  lipid panel liver functions and office visit

## 2012-11-29 ENCOUNTER — Telehealth: Payer: Self-pay | Admitting: Internal Medicine

## 2012-11-29 MED ORDER — ROSUVASTATIN CALCIUM 10 MG PO TABS: 10.0000 mg | ORAL_TABLET | Freq: Every day | ORAL | Status: DC

## 2012-12-05 ENCOUNTER — Other Ambulatory Visit: Payer: Self-pay

## 2013-03-20 ENCOUNTER — Other Ambulatory Visit: Payer: PRIVATE HEALTH INSURANCE | Admitting: Internal Medicine

## 2013-03-20 DIAGNOSIS — Z79899 Other long term (current) drug therapy: Secondary | ICD-10-CM

## 2013-03-20 DIAGNOSIS — E785 Hyperlipidemia, unspecified: Secondary | ICD-10-CM

## 2013-03-20 LAB — LIPID PANEL
CHOLESTEROL: 223 mg/dL — AB (ref 0–200)
HDL: 56 mg/dL (ref >39)
LDL Cholesterol: 112 mg/dL — ABNORMAL HIGH (ref 0–99)
Total CHOL/HDL Ratio: 4 Ratio
Triglycerides: 273 mg/dL — ABNORMAL HIGH (ref <150)
VLDL: 55 mg/dL — ABNORMAL HIGH (ref 0–40)

## 2013-03-20 LAB — HEPATIC FUNCTION PANEL
ALT: 26 U/L (ref 0–35)
AST: 23 U/L (ref 0–37)
Albumin: 4.3 g/dL (ref 3.5–5.2)
Alkaline Phosphatase: 89 U/L (ref 39–117)
BILIRUBIN DIRECT: 0.1 mg/dL (ref 0.0–0.3)
BILIRUBIN INDIRECT: 0.3 mg/dL (ref 0.0–0.9)
Total Bilirubin: 0.4 mg/dL (ref 0.3–1.2)
Total Protein: 6.7 g/dL (ref 6.0–8.3)

## 2013-03-21 ENCOUNTER — Ambulatory Visit: Payer: PRIVATE HEALTH INSURANCE | Admitting: Internal Medicine

## 2013-03-21 ENCOUNTER — Telehealth: Payer: Self-pay | Admitting: Internal Medicine

## 2013-03-21 NOTE — Telephone Encounter (Signed)
Was scheduled for 6 month recheck today. Results show worsening of total cholesterol, LDL and triglycerides. Admits to not being consistent with Statin drug during vacation Can recheck in 2-3 months lipid panel without liver functions and OV

## 2013-04-14 ENCOUNTER — Other Ambulatory Visit: Payer: Self-pay

## 2013-04-14 DIAGNOSIS — Z1231 Encounter for screening mammogram for malignant neoplasm of breast: Secondary | ICD-10-CM

## 2013-04-18 ENCOUNTER — Ambulatory Visit: Payer: PRIVATE HEALTH INSURANCE

## 2013-04-24 ENCOUNTER — Ambulatory Visit: Payer: PRIVATE HEALTH INSURANCE | Admitting: Internal Medicine

## 2013-04-24 ENCOUNTER — Other Ambulatory Visit: Payer: PRIVATE HEALTH INSURANCE | Admitting: Internal Medicine

## 2013-04-25 ENCOUNTER — Ambulatory Visit: Payer: PRIVATE HEALTH INSURANCE | Admitting: Internal Medicine

## 2013-05-02 ENCOUNTER — Ambulatory Visit: Payer: PRIVATE HEALTH INSURANCE

## 2013-05-08 ENCOUNTER — Other Ambulatory Visit: Payer: PRIVATE HEALTH INSURANCE | Admitting: Internal Medicine

## 2013-05-09 ENCOUNTER — Ambulatory Visit: Payer: PRIVATE HEALTH INSURANCE

## 2013-05-09 ENCOUNTER — Encounter: Payer: Self-pay | Admitting: Internal Medicine

## 2013-05-09 ENCOUNTER — Ambulatory Visit (INDEPENDENT_AMBULATORY_CARE_PROVIDER_SITE_OTHER): Payer: PRIVATE HEALTH INSURANCE | Admitting: Internal Medicine

## 2013-05-09 VITALS — BP 116/76 | HR 80 | Temp 98.3°F | Ht 63.75 in | Wt 156.5 lb

## 2013-05-09 DIAGNOSIS — E785 Hyperlipidemia, unspecified: Secondary | ICD-10-CM

## 2013-05-09 DIAGNOSIS — R143 Flatulence: Secondary | ICD-10-CM

## 2013-05-09 DIAGNOSIS — R142 Eructation: Secondary | ICD-10-CM

## 2013-05-09 DIAGNOSIS — Z87891 Personal history of nicotine dependence: Secondary | ICD-10-CM

## 2013-05-09 DIAGNOSIS — K219 Gastro-esophageal reflux disease without esophagitis: Secondary | ICD-10-CM

## 2013-05-09 DIAGNOSIS — R141 Gas pain: Secondary | ICD-10-CM

## 2013-05-09 DIAGNOSIS — H00019 Hordeolum externum unspecified eye, unspecified eyelid: Secondary | ICD-10-CM

## 2013-05-09 MED ORDER — ROSUVASTATIN CALCIUM 10 MG PO TABS: 10.0000 mg | ORAL_TABLET | Freq: Every day | ORAL | Status: DC

## 2013-05-09 NOTE — Patient Instructions (Signed)
Return next week for fasting lipid panel, liver functions and H. pylori antibody. Discontinue Zantac. Take Nexium 40 mg daily for 2 weeks then change to Nexium 24 over-the-counter. See ophthalmologist today.

## 2013-05-09 NOTE — Progress Notes (Signed)
   Subjective:    Patient ID: Dawn Herrera, female    DOB: 09-12-1950, 63 y.o.   MRN: [id number]  HPI On Sunday February 22, she developed a red eye with swelling of right lower lid. 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 Eye has not improved.   She tried Chantix to stop smoking and it did work for her. She completely quit smoking. However during the time she was on it, she developed severe flatulence and GE reflux symptoms. These have been reported as side effects. However, the symptoms have persisted. despite stopping Chantix. She's been taking Zantac over-the-counter and eating Activia. Still having reflux and flatulence but not quite as severe. I would think by now the Chantix would be out of her system and the symptoms would have resolved if they were due to the medication. She's also on Crestor for hyperlipidemia. She's wondering if Crestor is causing the symptoms. She previously tolerated Crestor without any issues.   Review of Systems     Objective:   Physical Exam Spent 20 minutes speaking with patient. She has infraorbital swelling of right eye. Sclera of right eye is injected. No drainage from corner of eye. Right lower lead and is swollen and red.       Assessment & Plan:  Hordeolum  Hyperlipidemia  GE reflux  Flatulence  Plan: Patient had H. pylori antibody and fasting lipid panel drawn next week as she is not fasting today. Appointment to see ophthalmologist today. Otherwise return in 6 months for physical examination. May need to see gastroenterologist if symptoms persist. Try Nexium 40 mg daily for 2 weeks then switch to Nexium 24 over-the-counter. Discontinue Zantac.

## 2013-05-12 ENCOUNTER — Other Ambulatory Visit: Payer: PRIVATE HEALTH INSURANCE | Admitting: Internal Medicine

## 2013-05-12 DIAGNOSIS — Z79899 Other long term (current) drug therapy: Secondary | ICD-10-CM

## 2013-05-12 DIAGNOSIS — E785 Hyperlipidemia, unspecified: Secondary | ICD-10-CM

## 2013-05-12 DIAGNOSIS — R109 Unspecified abdominal pain: Secondary | ICD-10-CM

## 2013-05-13 LAB — HEPATIC FUNCTION PANEL
ALT: 27 U/L (ref 0–35)
AST: 24 U/L (ref 0–37)
Albumin: 4 g/dL (ref 3.5–5.2)
Alkaline Phosphatase: 98 U/L (ref 39–117)
BILIRUBIN DIRECT: 0.1 mg/dL (ref 0.0–0.3)
BILIRUBIN INDIRECT: 0.4 mg/dL (ref 0.2–1.2)
TOTAL PROTEIN: 6.7 g/dL (ref 6.0–8.3)
Total Bilirubin: 0.5 mg/dL (ref 0.2–1.2)

## 2013-05-13 LAB — LIPID PANEL
CHOL/HDL RATIO: 3 ratio
Cholesterol: 175 mg/dL (ref 0–200)
HDL: 58 mg/dL (ref >39)
LDL CALC: 77 mg/dL (ref 0–99)
TRIGLYCERIDES: 200 mg/dL — AB (ref <150)
VLDL: 40 mg/dL (ref 0–40)

## 2013-05-14 ENCOUNTER — Telehealth: Payer: Self-pay | Admitting: Internal Medicine

## 2013-05-14 LAB — HELICOBACTER PYLORI  ANTIBODY, IGM: HELICOBACTER PYLORI, IGM: 12.7 U/mL — AB (ref <9.0)

## 2013-05-14 MED ORDER — AMOXICILLIN 500 MG PO CAPS: ORAL_CAPSULE | ORAL | Status: DC

## 2013-05-14 MED ORDER — ESOMEPRAZOLE MAGNESIUM 40 MG PO CPDR: 40.0000 mg | DELAYED_RELEASE_CAPSULE | Freq: Every day | ORAL | Status: DC

## 2013-05-14 MED ORDER — CLARITHROMYCIN 500 MG PO TABS: 500.0000 mg | ORAL_TABLET | Freq: Two times a day (BID) | ORAL | Status: DC

## 2013-05-14 NOTE — Telephone Encounter (Signed)
Patient has positive test for H. pylori. Was complaining of gastritis symptoms. Treat with Biaxin 500 mg twice daily for 14 days, amoxicillin 500 mg 2 tablets twice daily for 14 days, Nexium 40 mg daily for at least 2 weeks if not 30 days or more if she continues to have reflux symptoms.

## 2013-05-22 ENCOUNTER — Other Ambulatory Visit: Payer: PRIVATE HEALTH INSURANCE | Admitting: Internal Medicine

## 2013-05-22 ENCOUNTER — Encounter: Payer: Self-pay | Admitting: Internal Medicine

## 2013-05-22 ENCOUNTER — Ambulatory Visit (INDEPENDENT_AMBULATORY_CARE_PROVIDER_SITE_OTHER): Payer: PRIVATE HEALTH INSURANCE | Admitting: Internal Medicine

## 2013-05-22 VITALS — BP 100/70 | HR 92 | Temp 98.3°F | Wt 156.0 lb

## 2013-05-22 DIAGNOSIS — R21 Rash and other nonspecific skin eruption: Secondary | ICD-10-CM

## 2013-05-22 DIAGNOSIS — R768 Other specified abnormal immunological findings in serum: Secondary | ICD-10-CM

## 2013-05-22 DIAGNOSIS — R079 Chest pain, unspecified: Secondary | ICD-10-CM

## 2013-05-22 DIAGNOSIS — R894 Abnormal immunological findings in specimens from other organs, systems and tissues: Secondary | ICD-10-CM

## 2013-05-22 DIAGNOSIS — K219 Gastro-esophageal reflux disease without esophagitis: Secondary | ICD-10-CM

## 2013-05-22 DIAGNOSIS — R109 Unspecified abdominal pain: Secondary | ICD-10-CM

## 2013-05-22 LAB — CBC WITH DIFFERENTIAL/PLATELET
BASOS ABS: 0 10*3/uL (ref 0.0–0.1)
Basophils Relative: 0 % (ref 0–1)
EOS PCT: 2 % (ref 0–5)
Eosinophils Absolute: 0.1 10*3/uL (ref 0.0–0.7)
HCT: 41.3 % (ref 36.0–46.0)
Hemoglobin: 14.1 g/dL (ref 12.0–15.0)
LYMPHS ABS: 1.9 10*3/uL (ref 0.7–4.0)
LYMPHS PCT: 31 % (ref 12–46)
MCH: 28.4 pg (ref 26.0–34.0)
MCHC: 34.1 g/dL (ref 30.0–36.0)
MCV: 83.1 fL (ref 78.0–100.0)
Monocytes Absolute: 0.7 10*3/uL (ref 0.1–1.0)
Monocytes Relative: 12 % (ref 3–12)
NEUTROS ABS: 3.4 10*3/uL (ref 1.7–7.7)
Neutrophils Relative %: 55 % (ref 43–77)
PLATELETS: 263 10*3/uL (ref 150–400)
RBC: 4.97 MIL/uL (ref 3.87–5.11)
RDW: 14.3 % (ref 11.5–15.5)
WBC: 6.2 10*3/uL (ref 4.0–10.5)

## 2013-05-22 LAB — LIPASE: LIPASE: 68 U/L (ref 0–75)

## 2013-05-22 LAB — AMYLASE: AMYLASE: 59 U/L (ref 0–105)

## 2013-05-22 MED ORDER — TRIAMCINOLONE ACETONIDE 0.1 % EX CREA: 1.0000 "application " | TOPICAL_CREAM | Freq: Three times a day (TID) | CUTANEOUS | Status: DC

## 2013-05-22 NOTE — Patient Instructions (Addendum)
Change Nexium to the Dexilant 60 mg daily. Finish course of antibiotics right pylori. If Not better in 2-4 weeks recontact me. May need GI consultation. For rash behind knees triamcinolone cream 0.1% to use 3 times a day.

## 2013-05-22 NOTE — Progress Notes (Signed)
   Subjective:    Patient ID: Dawn Herrera, female    DOB: December 13, 1950, 63 y.o.   MRN: [id number]  HPI Patient here today still having lots of GE reflux symptoms. Recently found out she was positive for H. pylori antibody by blood test. She is now on treatment. Having diarrhea with antibiotics. Still having lots of loud burping. Has been taking Nexium as part of the 3 drug regimen. Quit smoking with the assistance of Chantix recently. She thought maybe Chantix was causing her symptoms but I'm not sure about that. She did visit relatives in the state of [place]  back in December and says symptoms started after that.  Also having some substernal chest pressure that awakens her from sleep. Pain does not radiate into the neck or down arm. No diaphoresis nausea or vomiting.  Also complaining of rash behind her knees it itches.    Review of Systems     Objective:   Physical Exam Skin is warm and dry. Nodes none. Abdomen bowel sounds are active no hepatosplenomegaly or masses. Has mild epigastric tenderness without rebound. Chest clear. No JVD or thyromegaly. Cardiac exam regular rate and rhythm. EKG no acute changes. Has excoriations in popliteal areas bilaterally but no frank rash.        Assessment & Plan:  Positive H. pylori antibody-treating with antibiotics  GE reflux-treated with PPI  Chest pain-EKG says no acute changes  Diarrhea from antibiotics-may take Imodium AD 1 tablet before going to work until antibiotic course is completed  Eczema bilaterally popliteal areas-use triamcinolone cream 0.1% behind knees 3 times a day when necessary  Plan: Change from Nexium to Dexilant 60 mg daily for 15 days- samples provided. Finish antibiotics as prescribed for H. pylori. If not getting better 2 weeks after finishing course of antibiotics she is to contact me. She may need GI consultation. Do not think chest pain is cardiac at this point. Use triamcinolone cream as directed  25  minutes spent with patient

## 2013-05-30 ENCOUNTER — Ambulatory Visit
Admission: RE | Admit: 2013-05-30 | Discharge: 2013-05-30 | Disposition: A | Discharge status: Home / Self Care | Payer: PRIVATE HEALTH INSURANCE | Source: Ambulatory Visit

## 2013-05-30 DIAGNOSIS — Z1231 Encounter for screening mammogram for malignant neoplasm of breast: Secondary | ICD-10-CM

## 2013-06-09 ENCOUNTER — Other Ambulatory Visit: Payer: Self-pay

## 2013-06-09 MED ORDER — ESOMEPRAZOLE MAGNESIUM 40 MG PO CPDR: 40.0000 mg | DELAYED_RELEASE_CAPSULE | Freq: Every day | ORAL | Status: DC

## 2013-06-16 ENCOUNTER — Ambulatory Visit (INDEPENDENT_AMBULATORY_CARE_PROVIDER_SITE_OTHER): Payer: PRIVATE HEALTH INSURANCE | Admitting: Internal Medicine

## 2013-06-16 ENCOUNTER — Encounter: Payer: Self-pay | Admitting: Internal Medicine

## 2013-06-16 VITALS — BP 116/68 | HR 80 | Temp 99.2°F | Wt 156.0 lb

## 2013-06-16 DIAGNOSIS — Z8709 Personal history of other diseases of the respiratory system: Secondary | ICD-10-CM

## 2013-06-16 DIAGNOSIS — B349 Viral infection, unspecified: Secondary | ICD-10-CM

## 2013-06-16 DIAGNOSIS — J029 Acute pharyngitis, unspecified: Secondary | ICD-10-CM

## 2013-06-16 DIAGNOSIS — B9789 Other viral agents as the cause of diseases classified elsewhere: Secondary | ICD-10-CM

## 2013-06-16 LAB — POCT RAPID STREP A (OFFICE): RAPID STREP A SCREEN: NEGATIVE

## 2013-06-16 MED ORDER — LEVOFLOXACIN 500 MG PO TABS: 500.0000 mg | ORAL_TABLET | Freq: Every day | ORAL | Status: DC

## 2013-06-16 NOTE — Patient Instructions (Signed)
Take Levaquin 500 milligrams daily for 7 days. Hydrocodone cough syrup which you already have on hand as needed for sore throat pain. Out of work today and possibly tomorrow. Call if not better in 7 days or sooner if worse.

## 2013-06-16 NOTE — Progress Notes (Signed)
   Subjective:    Patient ID: Dawn Herrera, female    DOB: [birth year], 63 y.o.   MRN: [id number]  HPI  Onset Thursday of sore throat, fatigue myalgias, shortness of breath, white sputum production. No diarrhea. No vomiting. Feels some nausea. No documented fever.    Review of Systems     Objective:   Physical Exam HEENT: TMs clear, pharynx: slightly injected; rapid strep negative. Neck supple, Chest clear to auscultation. Pulse ox 94%.       Assessment & Plan:   Viral Syndrome  History of COPD  Plan: Levaquin 500 mg daily x 7 days. Has hydrocodone which she may take for sore throat pain. Patient does not want to use inhalers. Call if not better in one week. Stay out of work today and possibly tomorrow.

## 2013-06-18 ENCOUNTER — Telehealth: Payer: Self-pay | Admitting: Internal Medicine

## 2013-06-18 NOTE — Telephone Encounter (Signed)
Patient called back to advise of [facility]) as her pharmacy and that she will take inhalor as well as steroid dose pack.    Spoke with Dr. Renold Genta at [facility] today;  Verbal order to call in Medrol 4mg , 6 day dose pack take in tapering courses as directed (6-5-4-3-2-1), no refill.  Also verbal order for Albuterol Inhaler, 1 unit, 2 sprays four times a day as needed, no refill.    Called the Rx's in to [facility]; spoke with Santiago Glad, [facility] and provided orders as stated above.    Advised patient these have been called in.

## 2013-06-30 ENCOUNTER — Telehealth: Payer: Self-pay

## 2013-06-30 ENCOUNTER — Telehealth: Payer: Self-pay | Admitting: Internal Medicine

## 2013-06-30 DIAGNOSIS — R109 Unspecified abdominal pain: Secondary | ICD-10-CM

## 2013-06-30 NOTE — Telephone Encounter (Signed)
Please refer to GI. Has she  in the past?

## 2013-06-30 NOTE — Telephone Encounter (Signed)
Referral done in Epic for GI

## 2013-07-01 ENCOUNTER — Encounter: Payer: Self-pay | Admitting: Gastroenterology

## 2013-07-04 ENCOUNTER — Encounter: Payer: Self-pay | Admitting: Internal Medicine

## 2013-07-04 ENCOUNTER — Ambulatory Visit (INDEPENDENT_AMBULATORY_CARE_PROVIDER_SITE_OTHER): Payer: PRIVATE HEALTH INSURANCE | Admitting: Internal Medicine

## 2013-07-04 VITALS — BP 112/74 | HR 80 | Temp 97.3°F | Wt 154.0 lb

## 2013-07-04 DIAGNOSIS — K219 Gastro-esophageal reflux disease without esophagitis: Secondary | ICD-10-CM

## 2013-07-04 DIAGNOSIS — R197 Diarrhea, unspecified: Secondary | ICD-10-CM

## 2013-07-04 NOTE — Progress Notes (Signed)
   Subjective:    Patient ID: Dawn Herrera, female    DOB: December 09, 1950, 63 y.o.   MRN: [id number]  HPI Patient was treated in early March for H. pylori gastritis. She had been taking Zantac and I switched her to Nexium but she did not like the way Nexium made her feel. Continues to have issues with epigastric discomfort and belching. Continues to feel fullness in the epigastric area. Denies pain at the present time. Earlier this week she developed diarrhea. Has had multiple bowel movements daily. No fever. No blood in bowel movement. No recent travel history. Lab work done in March included normal CBC with differential, normal amylase and lipase, normal hepatic panel and lipid panel. In January she had lipid panel and liver functions for followup of hyperlipidemia which were normal.    Review of Systems     Objective:   Physical Exam  Abdomen no masses appreciated      Assessment & Plan:  Diarrhea-perhaps viral etiology such as Norovirus but rule out C. difficile diarrhea.  Plan: Try Dexilant 60 mg daily instead of ranitidine. Stool samples for C. difficile. Ultrasound of the gallbladder. Clear liquids until diarrhea resolves then advance diet slowly. Arrange GI consultation.

## 2013-07-04 NOTE — Addendum Note (Signed)
Addended by: Brett Canales on: 07/04/2013 01:05 PM   Modules accepted: Orders

## 2013-07-04 NOTE — Patient Instructions (Addendum)
Try Dexilant instead of ranitidine. Bring stool sample in for C. difficile. Clear liquids until diarrhea resolves then advance diet slowly. Scheduled for ruq abdominal u/s on 07/09/2013 at 9:00 am at [facility].

## 2013-07-09 ENCOUNTER — Ambulatory Visit
Admission: RE | Admit: 2013-07-09 | Discharge: 2013-07-09 | Disposition: A | Discharge status: Home / Self Care | Payer: PRIVATE HEALTH INSURANCE | Source: Ambulatory Visit | Attending: Internal Medicine | Admitting: Internal Medicine

## 2013-07-09 DIAGNOSIS — R197 Diarrhea, unspecified: Secondary | ICD-10-CM

## 2013-07-09 DIAGNOSIS — K219 Gastro-esophageal reflux disease without esophagitis: Secondary | ICD-10-CM

## 2013-07-10 NOTE — Progress Notes (Signed)
Patient informed. 

## 2013-08-08 ENCOUNTER — Ambulatory Visit (INDEPENDENT_AMBULATORY_CARE_PROVIDER_SITE_OTHER): Payer: PRIVATE HEALTH INSURANCE | Admitting: Internal Medicine

## 2013-08-08 ENCOUNTER — Encounter: Payer: Self-pay | Admitting: Internal Medicine

## 2013-08-08 VITALS — BP 100/78 | Temp 97.7°F | Wt 150.0 lb

## 2013-08-08 DIAGNOSIS — IMO0001 Reserved for inherently not codable concepts without codable children: Secondary | ICD-10-CM

## 2013-08-08 DIAGNOSIS — L210 Seborrhea capitis: Secondary | ICD-10-CM

## 2013-08-08 DIAGNOSIS — L219 Seborrheic dermatitis, unspecified: Secondary | ICD-10-CM

## 2013-08-08 LAB — SEDIMENTATION RATE: SED RATE: 8 mm/h (ref 0–22)

## 2013-08-08 MED ORDER — KETOCONAZOLE 2 % EX SHAM: 1.0000 "application " | MEDICATED_SHAMPOO | CUTANEOUS | Status: DC

## 2013-08-08 NOTE — Progress Notes (Signed)
   Subjective:    Patient ID: Dawn Herrera Heins, female    DOB: 1950-06-09, 63 y.o.   MRN: [id number]  HPI Complaining of itchy scalp. Says husband wants her scalp last evening and thought he saw pimples on her scalp. She's concerned about head lice. Has appointment see GI physician June 9. Also recently had some issues going up stairs. Says legs felt weak in the quadriceps area. She's also has musculoskeletal pain in her lower extremities. No inflamed joints.    Review of Systems     Objective:   Physical Exam  No nits noted on hair. There are some scattered areas of folliculitis and some erythema where she has scratched with a few excoriations. There is some light scale .     Assessment & Plan:  Seborrhea  Episode of unexplained quadriceps muscle weakness and musculoskeletal pain in legs  Plan: Ketoconazole shampoo to use twice weekly. Keep appointment with gastroenterologist June 9  for epigastric pain. Sedimentation rate drawn today.

## 2013-08-08 NOTE — Patient Instructions (Signed)
Use Nizoral shampoo as directed twice weekly. Sedimentation rate drawn today for complaint of quadriceps weakness and pain. GI appointment June 9

## 2013-08-19 ENCOUNTER — Encounter: Payer: Self-pay | Admitting: Gastroenterology

## 2013-08-19 ENCOUNTER — Ambulatory Visit (INDEPENDENT_AMBULATORY_CARE_PROVIDER_SITE_OTHER): Payer: PRIVATE HEALTH INSURANCE | Admitting: Gastroenterology

## 2013-08-19 VITALS — BP 120/74 | HR 76 | Ht 63.75 in | Wt 154.4 lb

## 2013-08-19 DIAGNOSIS — Z860100 Personal history of colon polyps, unspecified: Secondary | ICD-10-CM

## 2013-08-19 DIAGNOSIS — R1013 Epigastric pain: Secondary | ICD-10-CM

## 2013-08-19 NOTE — Patient Instructions (Addendum)
We will get records sent from your previous gastroenterologist Dr. Wilford Corner at [facility] for review (two colonoscopies 2012 and also previously).  This will include any endoscopic (colonoscopy or upper endoscopy) procedures and any associated pathology reports.   You will be set up for an upper endoscopy (for epigastric pain). You should change the way you are taking your antiacid medicine (nexim) so that you are taking it 20-30 minutes prior to your dinner meal as that is the way the pill is designed to work most effectively.

## 2013-08-19 NOTE — Progress Notes (Signed)
HPI: This is a  very pleasant 63 year old woman whom I am meeting for the first time today.  She never goes to doctors.  Quit smoking, with aid of chantix 6-7 months ago.  She's had epigatric pain.  Labs done below, was treated with antibiotics.  Post prandial pain epig to throat. A lot of belching, a lot of gas.  Has been on nexium for 3 months.  One pill once daily.  Only very slightly better.   Has gained weight in past several months.  Takes one ASA periodically.  No dysphagia.  No overt GI bleeding.    [place] 06/2013 fatty liver, normal GB (was limited RUQ [place]) Labs 05/2013 normal cbc, normal cmet, H. Pylori serology ++ Colonoscopy 03/2010 (Vince Schooler from [facility]); pathology available  "hyperplastic polyps", however colonoscopy report not available (she has seen him for colonoscopies at least one other time).    Review of systems: Pertinent positive and negative review of systems were noted in the above HPI section. Complete review of systems was performed and was otherwise normal.    Past Medical History  Diagnosis Date  . Hyperlipidemia   . Depression   . Adenomatous polyp   . Allergic rhinitis   . Sleep apnea     wears mouth guard    Past Surgical History  Procedure Laterality Date  . Tubal ligation  2004    Current Outpatient Prescriptions  Medication Sig Dispense Refill  . calcium carbonate (OS-CAL) 600 MG TABS Take 600 mg by mouth 2 (two) times daily with a meal.      . cholecalciferol (VITAMIN D) 1000 UNITS tablet Take 1,000 Units by mouth daily.      Marland Kitchen esomeprazole (NEXIUM) 40 MG capsule Take 40 mg by mouth daily at 12 noon.      Marland Kitchen ketoconazole (NIZORAL) 2 % shampoo Apply 1 application topically 2 (two) times a week.  120 mL  3  . Multiple Vitamins-Minerals (MULTIVITAMIN WITH MINERALS) tablet Take 1 tablet by mouth daily.      . rosuvastatin (CRESTOR) 10 MG tablet Take 1 tablet (10 mg total) by mouth daily.  30 tablet  5   No current  facility-administered medications for this visit.    Allergies as of 08/19/2013 - Review Complete 08/19/2013  Allergen Reaction Noted  . Erythromycin Nausea Only 08/26/2010    Family History  Problem Relation Age of Onset  . Heart disease Father   . Diabetes      History   Social History  . Marital Status: Married    Spouse Name: Sonia Side    Number of Children: Y  . Years of Education: N/A   Occupational History  . PRE-CERTIFICATION     [employer]   Social History Main Topics  . Smoking status: Former Smoker -- 0.50 packs/day for 46 years    Types: Cigarettes    Quit date: 01/12/2013  . Smokeless tobacco: Never Used  . Alcohol Use: 0.5 oz/week    1 drink(s) per week  . Drug Use: No  . Sexual Activity: Not on file   Other Topics Concern  . Not on file   Social History Narrative  . No narrative on file       Physical Exam: BP 120/74  Pulse 76  Ht 5' 3.75" (1.619 m)  Wt 154 lb 6.4 oz (70.035 kg)  BMI 26.72 kg/m2 Constitutional: generally well-appearing Psychiatric: alert and oriented x3 Eyes: extraocular movements intact Mouth: oral pharynx moist, no  lesions Neck: supple no lymphadenopathy Cardiovascular: heart regular rate and rhythm Lungs: clear to auscultation bilaterally Abdomen: soft, nontender, nondistended, no obvious ascites, no peritoneal signs, normal bowel sounds Extremities: no lower extremity edema bilaterally Skin: no lesions on visible extremities    Assessment and plan: 63 y.o. female with  intermittent postprandial epigastric pains, previous H. pylori serology positive, history of colon polyps  Her H. pylori serology was elevated 3 months ago and she underwent appropriate antibiotic treatment. Usually one course of antibiotics is effective for eradicating this bacteria however not always. I would like to proceed with EGD to see if she has the bacteria in her stomach, search for other causes of her epigastric postprandial  pain. She is not taking proton pump inhibitor at the correct time and relation to being meals and I'm recommended she change that. She has also had polyps removed from her colon in the past. This was done at [facility] gastroenterology. I can only see her most recent pathology report from colonoscopy 2012th and that showed hyperplastic polyps. We will get records sent from her [facility] gastroenterologist here for review.

## 2013-08-26 ENCOUNTER — Telehealth: Payer: Self-pay | Admitting: Gastroenterology

## 2013-08-26 NOTE — Telephone Encounter (Signed)
Colonoscopy 03/2010 Dr. Michail Sermon; indications personal history of colon polyps; findings "4 flat polyps in rectosigmoid, 1-63mm across."  All were hyperplastic on pathology.   Patty, can you contact [facility].  She had a colonoscopy a few years before this one, I believe with [facility].  We need that older colonoscopy report, associated pathology to properly advise her on timing of next screening, surveillance colonoscopy.

## 2013-08-27 ENCOUNTER — Encounter: Payer: Self-pay | Admitting: Gastroenterology

## 2013-08-27 ENCOUNTER — Other Ambulatory Visit: Payer: Self-pay | Admitting: Gastroenterology

## 2013-08-27 ENCOUNTER — Ambulatory Visit (AMBULATORY_SURGERY_CENTER): Payer: PRIVATE HEALTH INSURANCE | Admitting: Gastroenterology

## 2013-08-27 VITALS — BP 122/73 | HR 76 | Temp 97.9°F | Resp 21 | Ht 63.0 in | Wt 154.0 lb

## 2013-08-27 DIAGNOSIS — K2970 Gastritis, unspecified, without bleeding: Secondary | ICD-10-CM

## 2013-08-27 DIAGNOSIS — R1013 Epigastric pain: Secondary | ICD-10-CM

## 2013-08-27 DIAGNOSIS — K449 Diaphragmatic hernia without obstruction or gangrene: Secondary | ICD-10-CM

## 2013-08-27 DIAGNOSIS — R933 Abnormal findings on diagnostic imaging of other parts of digestive tract: Secondary | ICD-10-CM

## 2013-08-27 DIAGNOSIS — K2980 Duodenitis without bleeding: Secondary | ICD-10-CM

## 2013-08-27 DIAGNOSIS — K2990 Gastroduodenitis, unspecified, without bleeding: Secondary | ICD-10-CM

## 2013-08-27 MED ORDER — SODIUM CHLORIDE 0.9 % IV SOLN: 500.0000 mL | INTRAVENOUS | Status: DC

## 2013-08-27 NOTE — Op Note (Signed)
[place]  [address] [place], [address]   ENDOSCOPY PROCEDURE REPORT  PATIENT: Dawn Herrera, Dawn Herrera  MR#: [id number] BIRTHDATE: 1951-02-25 , 35  yrs. old GENDER: Female ENDOSCOPIST: Milus Banister, MD REFERRED BY:  Emeline General, M.D. PROCEDURE DATE:  08/27/2013 PROCEDURE:  EGD w/ biopsy ASA CLASS:     Class II INDICATIONS:  dyspepsia, recent H.  pylori serology +, treated. MEDICATIONS: MAC sedation, administered by CRNA and propofol (Diprivan) 200mg  IV TOPICAL ANESTHETIC: none  DESCRIPTION OF PROCEDURE: After the risks benefits and alternatives of the procedure were thoroughly explained, informed consent was obtained.  The LB [id number] endoscope was introduced through the mouth and advanced to the second portion of the duodenum. Without limitations.  The instrument was slowly withdrawn as the mucosa was fully examined.    There was mild, non-specific distal gastritis.  This was biopsied and sent to pathology.  There was slightly nodular duodenitis in distal bulb of duodenum, biopsied also taken and sent to pathology. There was a 1-2cm hiatal hernia.  The examination was otherwise normal.  Retroflexed views revealed no abnormalities.     The scope was then withdrawn from the patient and the procedure completed.  COMPLICATIONS: There were no complications. ENDOSCOPIC IMPRESSION: There was mild, non-specific distal gastritis.  This was biopsied and sent to pathology.  There was slightly nodular duodenitis in distal bulb of duodenum, biopsied also taken and sent to pathology. There was a 1-2cm hiatal hernia.  The examination was otherwise normal.  RECOMMENDATIONS: Await final pathology results.  For now, continue taking your nexium 20-30 minutes prior to a meal.   eSigned:  Milus Banister, MD 08/27/2013 2:52 PM

## 2013-08-27 NOTE — Progress Notes (Signed)
Called to room to assist during endoscopic procedure.  Patient ID and intended procedure confirmed with present staff. Received instructions for my participation in the procedure from the performing physician.  

## 2013-08-27 NOTE — Patient Instructions (Signed)
Discharge instructions given with verbal understanding. Biopsies taken. Resume previous medications. YOU HAD AN ENDOSCOPIC PROCEDURE TODAY AT THE [facility]: Refer to the procedure report that was given to you for any specific questions about what was found during the examination.  If the procedure report does not answer your questions, please call your gastroenterologist to clarify.  If you requested that your care partner not be given the details of your procedure findings, then the procedure report has been included in a sealed envelope for you to review at your convenience later.  YOU SHOULD EXPECT: Some feelings of bloating in the abdomen. Passage of more gas than usual.  Walking can help get rid of the air that was put into your GI tract during the procedure and reduce the bloating. If you had a lower endoscopy (such as a colonoscopy or flexible sigmoidoscopy) you may notice spotting of blood in your stool or on the toilet paper. If you underwent a bowel prep for your procedure, then you may not have a normal bowel movement for a few days.  DIET: Your first meal following the procedure should be a light meal and then it is ok to progress to your normal diet.  A half-sandwich or bowl of soup is an example of a good first meal.  Heavy or fried foods are harder to digest and may make you feel nauseous or bloated.  Likewise meals heavy in dairy and vegetables can cause extra gas to form and this can also increase the bloating.  Drink plenty of fluids but you should avoid alcoholic beverages for 24 hours.  ACTIVITY: Your care partner should take you home directly after the procedure.  You should plan to take it easy, moving slowly for the rest of the day.  You can resume normal activity the day after the procedure however you should NOT DRIVE or use heavy machinery for 24 hours (because of the sedation medicines used during the test).    SYMPTOMS TO REPORT IMMEDIATELY: A gastroenterologist  can be reached at any hour.  During normal business hours, 8:30 AM to 5:00 PM Monday through Friday, call [phone].  After hours and on weekends, please call the GI answering service at [phone] who will take a message and have the physician on call contact you.   Following upper endoscopy (EGD)  Vomiting of blood or coffee ground material  New chest pain or pain under the shoulder blades  Painful or persistently difficult swallowing  New shortness of breath  Fever of 100F or higher  Black, tarry-looking stools  FOLLOW UP: If any biopsies were taken you will be contacted by phone or by letter within the next 1-3 weeks.  Call your gastroenterologist if you have not heard about the biopsies in 3 weeks.  Our staff will call the home number listed on your records the next business day following your procedure to check on you and address any questions or concerns that you may have at that time regarding the information given to you following your procedure. This is a courtesy call and so if there is no answer at the home number and we have not heard from you through the emergency physician on call, we will assume that you have returned to your regular daily activities without incident.  SIGNATURES/CONFIDENTIALITY: You and/or your care partner have signed paperwork which will be entered into your electronic medical record.  These signatures attest to the fact that that the information above on your After   Visit Summary has been reviewed and is understood.  Full responsibility of the confidentiality of this discharge information lies with you and/or your care-partner. 

## 2013-08-27 NOTE — Progress Notes (Signed)
Procedure ends, to recovery, report given and VSS. 

## 2013-08-28 ENCOUNTER — Telehealth: Payer: Self-pay

## 2013-08-28 NOTE — Telephone Encounter (Signed)
Recall in EPIC Left message on machine to call back

## 2013-08-28 NOTE — Telephone Encounter (Signed)
Left message on answering machine. 

## 2013-08-28 NOTE — Telephone Encounter (Signed)
FYI:  I called [facility] and the only colon they did for her is 2012.

## 2013-08-28 NOTE — Telephone Encounter (Signed)
[place], she needs recall colonsocopy 03/2020.  thanks

## 2013-08-29 NOTE — Telephone Encounter (Signed)
Left message on machine to call back  

## 2013-08-29 NOTE — Telephone Encounter (Signed)
Letter mailed to the patient to contact the office

## 2013-09-05 ENCOUNTER — Encounter: Payer: Self-pay | Admitting: Gastroenterology

## 2013-10-31 ENCOUNTER — Other Ambulatory Visit: Payer: Self-pay

## 2013-10-31 MED ORDER — ESOMEPRAZOLE MAGNESIUM 40 MG PO CPDR: 40.0000 mg | DELAYED_RELEASE_CAPSULE | Freq: Every day | ORAL | Status: DC

## 2013-12-29 ENCOUNTER — Other Ambulatory Visit: Payer: Self-pay | Admitting: Internal Medicine

## 2013-12-29 DIAGNOSIS — H8110 Benign paroxysmal vertigo, unspecified ear: Secondary | ICD-10-CM

## 2013-12-31 ENCOUNTER — Ambulatory Visit (INDEPENDENT_AMBULATORY_CARE_PROVIDER_SITE_OTHER): Payer: PRIVATE HEALTH INSURANCE | Admitting: Internal Medicine

## 2013-12-31 ENCOUNTER — Encounter: Payer: Self-pay | Admitting: Internal Medicine

## 2013-12-31 VITALS — BP 110/80 | HR 69 | Temp 97.1°F | Wt 152.0 lb

## 2013-12-31 DIAGNOSIS — H6503 Acute serous otitis media, bilateral: Secondary | ICD-10-CM

## 2013-12-31 DIAGNOSIS — H8110 Benign paroxysmal vertigo, unspecified ear: Secondary | ICD-10-CM

## 2013-12-31 DIAGNOSIS — R110 Nausea: Secondary | ICD-10-CM

## 2013-12-31 MED ORDER — LEVOFLOXACIN 500 MG PO TABS: 500.0000 mg | ORAL_TABLET | Freq: Every day | ORAL | Status: DC

## 2013-12-31 MED ORDER — ALPRAZOLAM 0.5 MG PO TABS: 0.5000 mg | ORAL_TABLET | ORAL | Status: DC

## 2013-12-31 MED ORDER — ONDANSETRON HCL 4 MG/2ML IJ SOLN
4.0000 mg | Freq: Once | INTRAMUSCULAR | Status: AC
  Administered 2013-12-31: 4 mg via INTRAMUSCULAR

## 2014-01-01 ENCOUNTER — Telehealth: Payer: Self-pay

## 2014-01-01 NOTE — Telephone Encounter (Signed)
Left message to check on patient today.

## 2014-02-06 NOTE — Progress Notes (Signed)
   Subjective:    Patient ID: Dawn Herrera, female    DOB: 09-12-1950, 63 y.o.   MRN: [id number]  HPI  Patient has been having an issue with positional vertigo for several days. Notices dizziness when turning over in bed or when changing positions otherwise. No fever. Slight cough but no acute upper respiratory infection. Mild headache.    Review of Systems     Objective:   Physical Exam Skin is warm and dry. Nodes none. Pharynx is clear. TM slightly full. Neck is supple. No carotid bruits. Chest clear to auscultation. Has rightward nystagmus. PERRLA. Extraocular movements are full. Cranial nerves II through XII are grossly intact. Deep tendon reflexes 2+ and symmetrical. Muscle strength is normal in the upper and lower extremities.       Assessment & Plan:  Benign positional vertigo  Bilateral serous otitis media  History of smoking  COPD  Plan: Xanax 0.5 mg 1/2-1 by mouth 3 times a day. Levaquin 500 milligrams daily for 7 days. Call if not better in 48-72 hours or sooner if worse.

## 2014-02-06 NOTE — Patient Instructions (Addendum)
Takes Xanax as prescribed. Take Levaquin as directed.

## 2014-02-26 ENCOUNTER — Telehealth: Payer: Self-pay | Admitting: Pulmonary Disease

## 2014-02-26 ENCOUNTER — Other Ambulatory Visit: Payer: PRIVATE HEALTH INSURANCE | Admitting: Internal Medicine

## 2014-02-26 DIAGNOSIS — Z0000 Encounter for general adult medical examination without abnormal findings: Secondary | ICD-10-CM

## 2014-02-26 DIAGNOSIS — E559 Vitamin D deficiency, unspecified: Secondary | ICD-10-CM

## 2014-02-26 DIAGNOSIS — E785 Hyperlipidemia, unspecified: Secondary | ICD-10-CM

## 2014-02-26 LAB — CBC WITH DIFFERENTIAL/PLATELET
Basophils Absolute: 0 10*3/uL (ref 0.0–0.1)
Basophils Relative: 0 % (ref 0–1)
EOS PCT: 2 % (ref 0–5)
Eosinophils Absolute: 0.1 10*3/uL (ref 0.0–0.7)
HCT: 43.6 % (ref 36.0–46.0)
HEMOGLOBIN: 14.6 g/dL (ref 12.0–15.0)
LYMPHS ABS: 1.4 10*3/uL (ref 0.7–4.0)
LYMPHS PCT: 30 % (ref 12–46)
MCH: 27.8 pg (ref 26.0–34.0)
MCHC: 33.5 g/dL (ref 30.0–36.0)
MCV: 83 fL (ref 78.0–100.0)
MONO ABS: 0.5 10*3/uL (ref 0.1–1.0)
MPV: 10.5 fL (ref 9.4–12.4)
Monocytes Relative: 10 % (ref 3–12)
Neutro Abs: 2.7 10*3/uL (ref 1.7–7.7)
Neutrophils Relative %: 58 % (ref 43–77)
Platelets: 244 10*3/uL (ref 150–400)
RBC: 5.25 MIL/uL — ABNORMAL HIGH (ref 3.87–5.11)
RDW: 14.7 % (ref 11.5–15.5)
WBC: 4.7 10*3/uL (ref 4.0–10.5)

## 2014-02-26 LAB — LIPID PANEL
CHOL/HDL RATIO: 4.6 ratio
CHOLESTEROL: 248 mg/dL — AB (ref 0–200)
HDL: 54 mg/dL (ref >39)
LDL Cholesterol: 145 mg/dL — ABNORMAL HIGH (ref 0–99)
TRIGLYCERIDES: 243 mg/dL — AB (ref <150)
VLDL: 49 mg/dL — AB (ref 0–40)

## 2014-02-26 LAB — TSH: TSH: 3.141 u[IU]/mL (ref 0.350–4.500)

## 2014-02-26 LAB — COMPREHENSIVE METABOLIC PANEL
ALT: 23 U/L (ref 0–35)
AST: 24 U/L (ref 0–37)
Albumin: 4.2 g/dL (ref 3.5–5.2)
Alkaline Phosphatase: 110 U/L (ref 39–117)
BILIRUBIN TOTAL: 0.5 mg/dL (ref 0.2–1.2)
BUN: 11 mg/dL (ref 6–23)
CALCIUM: 9.6 mg/dL (ref 8.4–10.5)
CHLORIDE: 104 meq/L (ref 96–112)
CO2: 28 meq/L (ref 19–32)
CREATININE: 0.93 mg/dL (ref 0.50–1.10)
GLUCOSE: 99 mg/dL (ref 70–99)
Potassium: 4.2 mEq/L (ref 3.5–5.3)
Sodium: 140 mEq/L (ref 135–145)
Total Protein: 6.9 g/dL (ref 6.0–8.3)

## 2014-02-26 NOTE — Telephone Encounter (Signed)
Spoke with the pt and notified of recs per [facility]  She verbalized understanding  She is faxing over a new ins card so we can find out about CPAP coverage  Once we receive, will give to [facility]  Will await fax

## 2014-02-26 NOTE — Telephone Encounter (Signed)
Ins card received and and given to [facility] to scan into our system  I will forward to [facility] now

## 2014-02-26 NOTE — Telephone Encounter (Signed)
Spoke with pt, states that she had a sleep study a few years ago (02/27/2011) And she ended up using a dental device from Dr. Oneal Grout.  Her teeth are shifting and she is no longer able to use the dental device.  She is wanting to start on the cpap.  She is requesting that this be done by the end of the year as she has met her deductible for the year.  Pt was last seen by [facility] on 03/20/11.    Dr. Gwenette Greet please advise on how to proceed with this.  Thank you.

## 2014-02-26 NOTE — Telephone Encounter (Signed)
We need to find out if insurance will pay for a cpap device if I have not seen her face to face in almost 3 yrs.  Let her know what we are doing.  We do not want her to get stuck with big bill with cpap.

## 2014-02-27 ENCOUNTER — Ambulatory Visit (INDEPENDENT_AMBULATORY_CARE_PROVIDER_SITE_OTHER): Payer: PRIVATE HEALTH INSURANCE | Admitting: Internal Medicine

## 2014-02-27 ENCOUNTER — Encounter: Payer: Self-pay | Admitting: Internal Medicine

## 2014-02-27 VITALS — BP 108/60 | HR 94 | Temp 97.1°F | Ht 63.0 in | Wt 154.0 lb

## 2014-02-27 DIAGNOSIS — Z86010 Personal history of colonic polyps: Secondary | ICD-10-CM | POA: Diagnosis not present

## 2014-02-27 DIAGNOSIS — G4733 Obstructive sleep apnea (adult) (pediatric): Secondary | ICD-10-CM | POA: Diagnosis not present

## 2014-02-27 DIAGNOSIS — K219 Gastro-esophageal reflux disease without esophagitis: Secondary | ICD-10-CM | POA: Diagnosis not present

## 2014-02-27 DIAGNOSIS — Z8719 Personal history of other diseases of the digestive system: Secondary | ICD-10-CM

## 2014-02-27 DIAGNOSIS — E785 Hyperlipidemia, unspecified: Secondary | ICD-10-CM

## 2014-02-27 DIAGNOSIS — Z0000 Encounter for general adult medical examination without abnormal findings: Secondary | ICD-10-CM | POA: Diagnosis not present

## 2014-02-27 DIAGNOSIS — Z720 Tobacco use: Secondary | ICD-10-CM | POA: Diagnosis not present

## 2014-02-27 DIAGNOSIS — Z87891 Personal history of nicotine dependence: Secondary | ICD-10-CM

## 2014-02-27 DIAGNOSIS — Z87898 Personal history of other specified conditions: Secondary | ICD-10-CM

## 2014-02-27 DIAGNOSIS — Z8709 Personal history of other diseases of the respiratory system: Secondary | ICD-10-CM

## 2014-02-27 DIAGNOSIS — K635 Polyp of colon: Secondary | ICD-10-CM | POA: Diagnosis not present

## 2014-02-27 DIAGNOSIS — Z8669 Personal history of other diseases of the nervous system and sense organs: Secondary | ICD-10-CM | POA: Diagnosis not present

## 2014-02-27 DIAGNOSIS — Z8619 Personal history of other infectious and parasitic diseases: Secondary | ICD-10-CM | POA: Diagnosis not present

## 2014-02-27 LAB — POCT URINALYSIS DIPSTICK
BILIRUBIN UA: NEGATIVE
GLUCOSE UA: NEGATIVE
KETONES UA: NEGATIVE
Leukocytes, UA: NEGATIVE
NITRITE UA: NEGATIVE
PH UA: 6
Protein, UA: NEGATIVE
RBC UA: NEGATIVE
Spec Grav, UA: 1.02
Urobilinogen, UA: NEGATIVE

## 2014-02-27 LAB — VITAMIN D 25 HYDROXY (VIT D DEFICIENCY, FRACTURES): Vit D, 25-Hydroxy: 31 ng/mL (ref 30–100)

## 2014-02-27 NOTE — Telephone Encounter (Signed)
Per Rhonda CPAP will not be covered since pt has not been seen in 2 years face to face. She will need appt. Called pt appt scheduled for Monday to see [facility]. Nothing further needed

## 2014-02-27 NOTE — Patient Instructions (Addendum)
Try Zetia 10 mg daily and return in 8 weeks for fasting lipid panel.

## 2014-02-27 NOTE — Telephone Encounter (Signed)
Pt calling and apparently misunderstood what nurse advised her she would do. Patient was under the impression that the nurse would contact the insurance company and see how much the insurance would pay for the device. I advised patient that we do not get benefits for CPAP machines, that is done by the DME company.  Pt was upset b/c there wasn't an order placed. I advised patient that we would have to check with Dr. Gwenette Greet to see if an appointment would be needed since she hasn't been seen since 2013. I advised patient that she would probably need an appointment, but patient requested that I check with Dr. Gwenette Greet first. Patient also stated that Dr. Ron Parker was to send Dr. Gwenette Greet information on her oral dental appliance and was wondering if that was done as well.    Please advise if patient needs an appointment with Dr. Gwenette Greet. Dawn Herrera Pt is going to call back on Monday to see if she needs an appointment before she can get the cpap order. Last seen by Dr. Gwenette Greet 2013. Rhonda J Herrera

## 2014-03-02 ENCOUNTER — Ambulatory Visit (INDEPENDENT_AMBULATORY_CARE_PROVIDER_SITE_OTHER): Payer: PRIVATE HEALTH INSURANCE | Admitting: Pulmonary Disease

## 2014-03-02 ENCOUNTER — Encounter: Payer: Self-pay | Admitting: Pulmonary Disease

## 2014-03-02 VITALS — BP 114/62 | HR 70 | Temp 97.4°F | Ht 63.0 in | Wt 154.6 lb

## 2014-03-02 DIAGNOSIS — G4733 Obstructive sleep apnea (adult) (pediatric): Secondary | ICD-10-CM

## 2014-03-02 NOTE — Progress Notes (Signed)
   Subjective:    Patient ID: Dawn Herrera, female    DOB: Nov 03, 1950, 63 y.o.   MRN: [id number]  HPI The patient comes in today for follow-up of her known moderate obstructive sleep apnea. She was treating her sleep apnea with a dental appliance, with very good response, but now is unable to wear her device because of bite alignment issues. She is going to have orthodontic work in order to improve this, and will have to have an alternative treatment to her sleep apnea in the interim. Since the last visit, she has actually gained 11 pounds.   Review of Systems  Constitutional: Negative for fever and unexpected weight change.  HENT: Negative for congestion, dental problem, ear pain, nosebleeds, postnasal drip, rhinorrhea, sinus pressure, sneezing, sore throat and trouble swallowing.   Eyes: Negative for redness and itching.  Respiratory: Negative for cough, chest tightness, shortness of breath and wheezing.   Cardiovascular: Negative for palpitations and leg swelling.  Gastrointestinal: Negative for nausea and vomiting.  Genitourinary: Negative for dysuria.  Musculoskeletal: Negative for joint swelling.  Skin: Negative for rash.  Neurological: Negative for headaches.  Hematological: Does not bruise/bleed easily.  Psychiatric/Behavioral: Negative for dysphoric mood. The patient is not nervous/anxious.        Objective:   Physical Exam Well-developed female in no acute distress Nose without purulence or discharge noted Neck without lymphadenopathy or thyromegaly No skin breakdown or pressure necrosis from the C Pap mask Lower extremities without edema, no cyanosis Alert and oriented, moves all 4 extremities.       Assessment & Plan:

## 2014-03-02 NOTE — Assessment & Plan Note (Signed)
The patient has moderate obstructive sleep apnea, but has done very well with a dental appliance over the last 2 years. She is now unable to wear the appliance because of bite issues, and will need to transition to see CPAP until she can get her alignment issues solved.  She clearly has persistent sleep apnea, with ongoing symptoms and a weight gain of 11 pounds from the last visit. I will set the patient up on cpap at a moderate pressure level to allow for desensitization, and will troubleshoot the device over the next 4-6weeks if needed.  The pt is to call me if having issues with tolerance.  Will then optimize the pressure once patient is able to wear cpap on a consistent basis.

## 2014-03-02 NOTE — Patient Instructions (Signed)
Will get you started on cpap at a moderate level. Work on weight loss followup with me again in 8 weeks, but call if having issues with cpap tolerance

## 2014-04-28 ENCOUNTER — Encounter (INDEPENDENT_AMBULATORY_CARE_PROVIDER_SITE_OTHER): Payer: Self-pay

## 2014-04-28 ENCOUNTER — Ambulatory Visit (INDEPENDENT_AMBULATORY_CARE_PROVIDER_SITE_OTHER): Payer: PRIVATE HEALTH INSURANCE | Admitting: Pulmonary Disease

## 2014-04-28 ENCOUNTER — Encounter: Payer: Self-pay | Admitting: Pulmonary Disease

## 2014-04-28 DIAGNOSIS — G4733 Obstructive sleep apnea (adult) (pediatric): Secondary | ICD-10-CM

## 2014-04-28 NOTE — Patient Instructions (Signed)
Will change your pressure to 5-20 range Turn up the heater on your humidifier. followup with me in 60mos, but please call us in about 2 weeks to go ahead and get a download off your device to make sure things are going well.

## 2014-04-28 NOTE — Progress Notes (Signed)
   Subjective:    Patient ID: Dawn Herrera, female    DOB: [birth year], 64 y.o.   MRN: [id number]  HPI The patient comes in today for follow-up of her obstructive sleep apnea. C Pap at the last visit, and has been fairly compliant based on her download. She is having issues with dryness, and her download does show significant breakthrough events because of her pressure limitation on the auto setting. Her mask seems to be fitting well from a leak standpoint. An she is not uncomfortable with it.   Review of Systems  Constitutional: Negative for fever and unexpected weight change.  HENT: Negative for congestion, dental problem, ear pain, nosebleeds, postnasal drip, rhinorrhea, sinus pressure, sneezing, sore throat and trouble swallowing.   Eyes: Negative for redness and itching.  Respiratory: Negative for cough, chest tightness, shortness of breath and wheezing.   Cardiovascular: Negative for palpitations and leg swelling.  Gastrointestinal: Negative for nausea and vomiting.  Genitourinary: Negative for dysuria.  Musculoskeletal: Negative for joint swelling.  Skin: Negative for rash.  Neurological: Negative for headaches.  Hematological: Does not bruise/bleed easily.  Psychiatric/Behavioral: Negative for dysphoric mood. The patient is not nervous/anxious.        Objective:   Physical Exam Well-developed female in no acute distress Nose without purulence or discharge noted Neck without lymphadenopathy or thyromegaly No skin breakdown or pressure necrosis from the C Pap mask Lower extremities without significant edema, no cyanosis Alert and oriented, moves all 4 extremities.       Assessment & Plan:

## 2014-04-28 NOTE — Assessment & Plan Note (Signed)
The patient has been working with C Pap compliance, but has not been able to get through a full night. Her download shows significant breakthrough events, and she will obviously need a higher pressure for adequate control. Will increase the pressure limit on her auto setting, and I've also asked her to turn up the heat on her humidifier in order to improve dryness. We will get a follow-up download in a few weeks to make sure her apnea is doing better.

## 2014-05-29 ENCOUNTER — Other Ambulatory Visit: Payer: PRIVATE HEALTH INSURANCE | Admitting: Internal Medicine

## 2014-05-29 DIAGNOSIS — E785 Hyperlipidemia, unspecified: Secondary | ICD-10-CM

## 2014-05-30 LAB — LIPID PANEL
Cholesterol: 243 mg/dL — ABNORMAL HIGH (ref 0–200)
HDL: 55 mg/dL (ref >=46)
LDL CALC: 140 mg/dL — AB (ref 0–99)
TRIGLYCERIDES: 239 mg/dL — AB (ref <150)
Total CHOL/HDL Ratio: 4.4 Ratio
VLDL: 48 mg/dL — ABNORMAL HIGH (ref 0–40)

## 2014-06-10 NOTE — Progress Notes (Addendum)
Subjective:    Patient ID: Dawn Herrera, female    DOB: 12/14/1950, 64 y.o.   MRN: [id number]  HPI 64 year old white female in today for health maintenance exam and evaluation of medical problems. She has a history of hyperlipidemia, exacerbations of COPD, history of smoking, microscopic hematuria, benign positional vertigo. Currently on Crestor for hyperlipidemia. History of vitamin D deficiency and adenomatous colon polyp. Had colonoscopy in 2007 by Dr. Michail Sermon. This showed one small adenomatous polyp in the descending colon. She had repeat colonoscopy January 2012 showing hyperplastic polyps. Dr. Lawerance Bach evaluated her for microscopic hematuria in 2007 with normal cystoscopy and ureteroscopy.  History of snoring and obstructive sleep apnea. Bone density study was done in January 2014. Mammogram 2014.  Saw Dr. Daylene Katayama in 2009 for impingement of right shoulder with mild adhesive capsulitis. History of bone density study 2010 showing mild osteopenia. Tubal ligation 1994. No known drug allergies.  Social history: She is married. Completed one year college. Husband is retired. She has smoked a half to one pack of cigarettes daily for over 30 years. Currently smoking about a half pack daily. Has about 2 alcoholic drinks a week. 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 2 adult daughters.  Family history: Father died at 59 of congestive heart failure. Mother with history of coronary artery disease, MI, status post CABG. One brother in good health. 4 sisters in good health.  Recent issues with benign positional vertigo October 2015-had Epley maneuver   In June 2015 she had an episode of epigastric pain. She had been diagnosed with H. pylori and treated with 3 drug regimen. She saw Dr. Ardis Hughs who performed endoscopy showing duodenitis and gastritis along with a hiatal hernia.  Review of Systems  Constitutional: Negative.   Cardiovascular: Negative.   Gastrointestinal:  Negative.   Genitourinary: Negative.   Hematological: Negative.   Psychiatric/Behavioral: Negative.   All other systems reviewed and are negative.      Objective:   Physical Exam  Constitutional: She is oriented to person, place, and time. She appears well-developed and well-nourished. No distress.  HENT:  Head: Normocephalic and atraumatic.  Right Ear: External ear normal.  Mouth/Throat: Oropharynx is clear and moist. No oropharyngeal exudate.  Eyes: Conjunctivae and EOM are normal. Pupils are equal, round, and reactive to light. Right eye exhibits no discharge. Left eye exhibits no discharge. No scleral icterus.  Neck: Neck supple. No JVD present. No thyromegaly present.  Cardiovascular: Normal rate, regular rhythm, normal heart sounds and intact distal pulses.   No murmur heard. Pulmonary/Chest: Effort normal and breath sounds normal. She has no wheezes. She has no rales. She exhibits no tenderness.  Breasts normal female  Abdominal: Soft. Bowel sounds are normal. She exhibits no distension and no mass. There is no tenderness. There is no rebound and no guarding.  Genitourinary:  Pap done 2013. Bimanual normal.  Musculoskeletal: She exhibits no edema.  Lymphadenopathy:    She has no cervical adenopathy.  Neurological: She is alert and oriented to person, place, and time. She has normal reflexes. No cranial nerve deficit. Coordination normal.  Skin: Skin is warm and dry. No rash noted. She is not diaphoretic.  Psychiatric: She has a normal mood and affect. Her behavior is normal. Judgment and thought content normal.  Vitals reviewed.         Assessment & Plan:  Hyperlipidemia-treated with Crestor patient complaining of myalgias with Crestor. Changed to Zetia 10 mg daily and repeat lipid  panel in 8 weeks.Marland Kitchen  History of microscopic hematuria previously evaluated by urologist  History of smoking  History of adenomatous colon polyp 2007  History of hyperplastic colon polyp  2012  Obstructive sleep apnea-to see Dr. Gwenette Greet  Osteopenia  Benign positional vertigo  History of gastritis and duodenitis  History of hiatal hernia  Plan: Return in 6 months or as needed. Smoking cessation counseling.

## 2014-06-22 ENCOUNTER — Other Ambulatory Visit: Payer: Self-pay

## 2014-06-22 DIAGNOSIS — Z1231 Encounter for screening mammogram for malignant neoplasm of breast: Secondary | ICD-10-CM

## 2014-06-23 ENCOUNTER — Ambulatory Visit
Admission: RE | Admit: 2014-06-23 | Discharge: 2014-06-23 | Disposition: A | Discharge status: Home / Self Care | Payer: PRIVATE HEALTH INSURANCE | Source: Ambulatory Visit

## 2014-06-23 ENCOUNTER — Encounter (INDEPENDENT_AMBULATORY_CARE_PROVIDER_SITE_OTHER): Payer: Self-pay

## 2014-06-23 DIAGNOSIS — Z1231 Encounter for screening mammogram for malignant neoplasm of breast: Secondary | ICD-10-CM

## 2014-06-24 ENCOUNTER — Other Ambulatory Visit: Payer: Self-pay | Admitting: Internal Medicine

## 2014-06-24 DIAGNOSIS — R928 Other abnormal and inconclusive findings on diagnostic imaging of breast: Secondary | ICD-10-CM

## 2014-06-26 ENCOUNTER — Other Ambulatory Visit: Payer: Self-pay | Admitting: Internal Medicine

## 2014-06-26 ENCOUNTER — Other Ambulatory Visit: Payer: Self-pay

## 2014-06-26 DIAGNOSIS — R928 Other abnormal and inconclusive findings on diagnostic imaging of breast: Secondary | ICD-10-CM

## 2014-06-29 ENCOUNTER — Ambulatory Visit
Admission: RE | Admit: 2014-06-29 | Discharge: 2014-06-29 | Disposition: A | Discharge status: Home / Self Care | Payer: PRIVATE HEALTH INSURANCE | Source: Ambulatory Visit | Attending: Internal Medicine | Admitting: Internal Medicine

## 2014-06-29 DIAGNOSIS — R928 Other abnormal and inconclusive findings on diagnostic imaging of breast: Secondary | ICD-10-CM

## 2014-10-06 ENCOUNTER — Other Ambulatory Visit: Payer: Self-pay | Admitting: Internal Medicine

## 2014-11-03 ENCOUNTER — Ambulatory Visit: Payer: PRIVATE HEALTH INSURANCE | Admitting: Pulmonary Disease

## 2014-11-05 ENCOUNTER — Other Ambulatory Visit: Payer: Self-pay | Admitting: Internal Medicine

## 2014-11-12 ENCOUNTER — Other Ambulatory Visit: Payer: Medicare Other | Admitting: Internal Medicine

## 2014-11-12 ENCOUNTER — Ambulatory Visit: Payer: PRIVATE HEALTH INSURANCE | Admitting: Pulmonary Disease

## 2014-11-12 DIAGNOSIS — E785 Hyperlipidemia, unspecified: Secondary | ICD-10-CM | POA: Diagnosis not present

## 2014-11-12 LAB — LIPID PANEL
CHOLESTEROL: 286 mg/dL — AB (ref 125–200)
HDL: 59 mg/dL (ref >=46)
LDL CALC: 183 mg/dL — AB (ref <130)
Total CHOL/HDL Ratio: 4.8 Ratio (ref <=5.0)
Triglycerides: 222 mg/dL — ABNORMAL HIGH (ref <150)
VLDL: 44 mg/dL — ABNORMAL HIGH (ref <30)

## 2014-11-13 ENCOUNTER — Ambulatory Visit (INDEPENDENT_AMBULATORY_CARE_PROVIDER_SITE_OTHER): Payer: Medicare Other | Admitting: Internal Medicine

## 2014-11-13 ENCOUNTER — Encounter: Payer: Self-pay | Admitting: Internal Medicine

## 2014-11-13 VITALS — BP 104/64 | HR 80 | Temp 97.7°F | Wt 153.0 lb

## 2014-11-13 DIAGNOSIS — E785 Hyperlipidemia, unspecified: Secondary | ICD-10-CM

## 2014-11-13 DIAGNOSIS — Z23 Encounter for immunization: Secondary | ICD-10-CM | POA: Diagnosis not present

## 2014-11-13 MED ORDER — ROSUVASTATIN CALCIUM 5 MG PO TABS: 5.0000 mg | ORAL_TABLET | Freq: Every day | ORAL | Status: DC

## 2014-12-06 ENCOUNTER — Encounter: Payer: Self-pay | Admitting: Internal Medicine

## 2014-12-06 NOTE — Progress Notes (Signed)
   Subjective:    Patient ID: Dawn Herrera Heins, female    DOB: 1950-05-06, 64 y.o.   MRN: [id number]  HPI Patient previously was on Crestor but complained of musculoskeletal pain on that medication. When I saw her in December 2015 she had stopped Crestor. Was subsequently switched her to [place] in December 2015. When we checked her lipid panel in March, there was no significant change. We gave her some time to work on diet and exercise. Total cholesterol is actually worsened from 243 to 286 6 months ago. Triglycerides are down just a bit from 239-222. LDL cholesterol is up from 140-183. Explained to patient that Zetia likely would not affect triglycerides. She does need to try to diet and exercise but apparently has been on vacation recently.  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 She is enjoying her time off.    Review of Systems     Objective:   Physical Exam  Neck supple without thyromegaly. Chest clear. Cardiac exam regular rate and rhythm. Extremities without edema. Flu vaccine given.      Assessment & Plan:  Mixed hyperlipidemia  Plan: Zetia is obviously not working for patient. She agrees to try Crestor once again (generic) and return in December for follow-up

## 2014-12-06 NOTE — Patient Instructions (Signed)
Restart Crestor. Generic preparation is been prescribed. Discontinue Zetia. Follow-up in December.

## 2015-01-07 ENCOUNTER — Ambulatory Visit (INDEPENDENT_AMBULATORY_CARE_PROVIDER_SITE_OTHER): Payer: Medicare Other | Admitting: Pulmonary Disease

## 2015-01-07 ENCOUNTER — Encounter: Payer: Self-pay | Admitting: Pulmonary Disease

## 2015-01-07 VITALS — BP 105/71 | HR 89 | Temp 98.3°F | Ht 64.0 in | Wt 164.0 lb

## 2015-01-07 DIAGNOSIS — G4733 Obstructive sleep apnea (adult) (pediatric): Secondary | ICD-10-CM | POA: Diagnosis not present

## 2015-01-07 NOTE — Addendum Note (Signed)
Addended by: Mathis Dad on: 01/07/2015 05:23 PM   Modules accepted: Orders

## 2015-01-07 NOTE — Progress Notes (Signed)
   Subjective:    Patient ID: Dawn Herrera, female    DOB: [birth year], 64 y.o.   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  01/07/2015  Chief Complaint  Patient presents with  . Sleep Apnea    Former [facility] pt; pt wore dental appliance for a while, but teeth shifted and was taken off dental and put on CPAP.  Has not been able to get the pressure set properly to tolerate machine.  Needs to discuss options.   On last visit 04/2014 with [facility],  Her download showed significant breakthrough events, and  Increase d the pressure limit on her auto setting. Since then, the pr was to high & she stopped using CPAP Also wants to change DME from [facility] to [facility]  Uses FF mask , since mouth breather   Continues to snore - family members complaining  Has seen dr Ron Parker - has invisalign  Review of Systems neg for any significant sore throat, dysphagia, itching, sneezing, nasal congestion or excess/ purulent secretions, fever, chills, sweats, unintended wt loss, pleuritic or exertional cp, hempoptysis, orthopnea pnd or change in chronic leg swelling. Also denies presyncope, palpitations, heartburn, abdominal pain, nausea, vomiting, diarrhea or change in bowel or urinary habits, dysuria,hematuria, rash, arthralgias, visual complaints, headache, numbness weakness or ataxia.     Objective:   Physical Exam  Gen. Pleasant, well-nourished, in no distress ENT - no lesions, no post nasal drip Neck: No JVD, no thyromegaly, no carotid bruits Lungs: no use of accessory muscles, no dullness to percussion, clear without rales or rhonchi  Cardiovascular: Rhythm regular, heart sounds  normal, no murmurs or gallops, no peripheral edema Musculoskeletal: No deformities, no cyanosis or clubbing         Assessment & Plan:

## 2015-01-07 NOTE — Assessment & Plan Note (Addendum)
Change DME to [facility] Will decrease auto CPAP settings to 5-12 & Check download in 1 month - will trade comfort/ compliance Rx for new CPAP supplies - nasal mask + chin strap Goes back to dental device in 35 wks & will rpt HST then  Weight loss encouraged, compliance with goal of at least 4-6 hrs every night is the expectation. Advised against medications with sedative side effects Cautioned against driving when sleepy - understanding that sleepiness will vary on a day to day basis

## 2015-01-07 NOTE — Patient Instructions (Signed)
Change DME to [facility] Will decrease auto CPAP settings to 5-12 & Check download in 1 month Rx for new CPAP supplies - nasal mask + chin strap

## 2015-01-18 DIAGNOSIS — G4733 Obstructive sleep apnea (adult) (pediatric): Secondary | ICD-10-CM | POA: Diagnosis not present

## 2015-02-10 ENCOUNTER — Ambulatory Visit (INDEPENDENT_AMBULATORY_CARE_PROVIDER_SITE_OTHER): Payer: Medicare Other | Admitting: Internal Medicine

## 2015-02-10 ENCOUNTER — Encounter: Payer: Self-pay | Admitting: Internal Medicine

## 2015-02-10 VITALS — BP 124/68 | HR 72 | Temp 97.7°F | Resp 18 | Wt 164.0 lb

## 2015-02-10 DIAGNOSIS — Z79899 Other long term (current) drug therapy: Secondary | ICD-10-CM

## 2015-02-10 DIAGNOSIS — E785 Hyperlipidemia, unspecified: Secondary | ICD-10-CM | POA: Diagnosis not present

## 2015-02-10 LAB — HEPATIC FUNCTION PANEL
ALBUMIN: 3.9 g/dL (ref 3.6–5.1)
ALK PHOS: 109 U/L (ref 33–130)
ALT: 28 U/L (ref 6–29)
AST: 25 U/L (ref 10–35)
BILIRUBIN TOTAL: 0.4 mg/dL (ref 0.2–1.2)
Bilirubin, Direct: 0.1 mg/dL (ref <=0.2)
Indirect Bilirubin: 0.3 mg/dL (ref 0.2–1.2)
TOTAL PROTEIN: 6.9 g/dL (ref 6.1–8.1)

## 2015-02-10 LAB — LIPID PANEL
CHOL/HDL RATIO: 3.5 ratio (ref <=5.0)
Cholesterol: 176 mg/dL (ref 125–200)
HDL: 51 mg/dL (ref >=46)
LDL Cholesterol: 90 mg/dL (ref <130)
TRIGLYCERIDES: 177 mg/dL — AB (ref <150)
VLDL: 35 mg/dL — ABNORMAL HIGH (ref <30)

## 2015-02-10 MED ORDER — LEVOFLOXACIN 500 MG PO TABS: 500.0000 mg | ORAL_TABLET | Freq: Every day | ORAL | Status: DC

## 2015-02-10 MED ORDER — PREDNISONE 10 MG PO TABS: ORAL_TABLET | ORAL | Status: DC

## 2015-02-10 NOTE — Progress Notes (Signed)
   Subjective:    Patient ID: Dawn Herrera, female    DOB: September 27, 1950, 64 y.o.   MRN: [id number]  HPI Patient had an appointment this coming Friday December second for follow-up on hyperlipidemia. Zetia did not work for her and she had persistent hyperlipidemia so we restarted Crestor at a dose of 5 mg rather than 10 mg daily. She's tolerated that fine without any leg weakness or myalgias. She called today asking if she could have fasting lab work done consisting of lipid panel and liver functions today instead of tomorrow. We asked her to come on and have this done. She mentioned at the time she was here for blood drawing that she had an acute respiratory infection that started after trip to [place] for the Thanksgiving holidays where she was exposed to a lot of people. No fever or shaking chills. Has cough and congestion. Slight wheezing. Complaining of ringing in her ears. Cannot wear hearing aids. Was able to get nasal C Pap supplies from [facility] and is using C Pap again for sleep apnea. Did see Dr. Elsworth Soho, pulmonologist.     Review of Systems     Objective:   Physical Exam  Skin warm and dry. TMs are clear. Pharynx is clear. Neck is supple. Chest is clear to auscultation. She sounds nasally congested and is having difficulty hearing.       Assessment & Plan:   Hyperlipidemia-lipid panel liver functions on Crestor 5 mg pending. Cancel appointment for December 2  Acute URI  Hearing loss  Sleep apnea  Plan: Sterapred DS 10 mg 6 day dosepak take as directed. Levaquin 500 mg daily for 10 days. Does not like to use inhalers. Has Hycodan left over from previous prescription for cough. We'll call her with lab results regarding Crestor therapy for hyperlipidemia.

## 2015-02-10 NOTE — Patient Instructions (Signed)
Lab work is pending. Take Levaquin as directed and Sterapred in tapering course as directed. Patient does not like to use inhalers. As Hycodan for cough. Cancel appointment regarding hyperlipidemia for Friday, December 2. This was addressed today in addition to acute respiratory infection. Lipid panel liver functions pending.

## 2015-02-11 ENCOUNTER — Other Ambulatory Visit: Payer: Medicare Other | Admitting: Internal Medicine

## 2015-02-12 ENCOUNTER — Ambulatory Visit: Payer: Medicare Other | Admitting: Internal Medicine

## 2015-02-17 ENCOUNTER — Other Ambulatory Visit: Payer: Self-pay

## 2015-02-17 MED ORDER — ROSUVASTATIN CALCIUM 5 MG PO TABS: 5.0000 mg | ORAL_TABLET | Freq: Every day | ORAL | Status: DC

## 2015-03-23 DIAGNOSIS — M5410 Radiculopathy, site unspecified: Secondary | ICD-10-CM | POA: Diagnosis not present

## 2015-04-28 DIAGNOSIS — G4733 Obstructive sleep apnea (adult) (pediatric): Secondary | ICD-10-CM | POA: Diagnosis not present

## 2015-05-07 DIAGNOSIS — G4733 Obstructive sleep apnea (adult) (pediatric): Secondary | ICD-10-CM | POA: Diagnosis not present

## 2015-07-01 ENCOUNTER — Other Ambulatory Visit: Payer: Self-pay

## 2015-07-01 DIAGNOSIS — Z1231 Encounter for screening mammogram for malignant neoplasm of breast: Secondary | ICD-10-CM

## 2015-07-19 ENCOUNTER — Ambulatory Visit
Admission: RE | Admit: 2015-07-19 | Discharge: 2015-07-19 | Disposition: A | Discharge status: Home / Self Care | Payer: Medicare Other | Source: Ambulatory Visit

## 2015-07-19 DIAGNOSIS — Z1231 Encounter for screening mammogram for malignant neoplasm of breast: Secondary | ICD-10-CM | POA: Diagnosis not present

## 2015-07-30 DIAGNOSIS — G4733 Obstructive sleep apnea (adult) (pediatric): Secondary | ICD-10-CM | POA: Diagnosis not present

## 2015-08-19 ENCOUNTER — Other Ambulatory Visit: Payer: Self-pay | Admitting: Internal Medicine

## 2015-08-20 ENCOUNTER — Encounter: Payer: Self-pay | Admitting: Internal Medicine

## 2015-09-21 ENCOUNTER — Other Ambulatory Visit: Payer: Medicare Other | Admitting: Internal Medicine

## 2015-09-21 DIAGNOSIS — E559 Vitamin D deficiency, unspecified: Secondary | ICD-10-CM | POA: Diagnosis not present

## 2015-09-21 DIAGNOSIS — Z1329 Encounter for screening for other suspected endocrine disorder: Secondary | ICD-10-CM

## 2015-09-21 DIAGNOSIS — E785 Hyperlipidemia, unspecified: Secondary | ICD-10-CM

## 2015-09-21 DIAGNOSIS — Z0000 Encounter for general adult medical examination without abnormal findings: Secondary | ICD-10-CM | POA: Diagnosis not present

## 2015-09-21 DIAGNOSIS — Z130 Encounter for screening for diseases of the blood and blood-forming organs and certain disorders involving the immune mechanism: Secondary | ICD-10-CM | POA: Diagnosis not present

## 2015-09-21 LAB — COMPLETE METABOLIC PANEL WITH GFR
ALT: 22 U/L (ref 6–29)
AST: 22 U/L (ref 10–35)
Albumin: 4.1 g/dL (ref 3.6–5.1)
Alkaline Phosphatase: 103 U/L (ref 33–130)
BILIRUBIN TOTAL: 0.5 mg/dL (ref 0.2–1.2)
BUN: 15 mg/dL (ref 7–25)
CHLORIDE: 102 mmol/L (ref 98–110)
CO2: 21 mmol/L (ref 20–31)
Calcium: 9.5 mg/dL (ref 8.6–10.4)
Creat: 0.83 mg/dL (ref 0.50–0.99)
GFR, EST NON AFRICAN AMERICAN: 74 mL/min (ref >=60)
GFR, Est African American: 85 mL/min (ref >=60)
GLUCOSE: 102 mg/dL — AB (ref 65–99)
Potassium: 4.5 mmol/L (ref 3.5–5.3)
SODIUM: 138 mmol/L (ref 135–146)
TOTAL PROTEIN: 6.6 g/dL (ref 6.1–8.1)

## 2015-09-21 LAB — CBC WITH DIFFERENTIAL/PLATELET
BASOS ABS: 0 {cells}/uL (ref 0–200)
Basophils Relative: 0 %
EOS ABS: 144 {cells}/uL (ref 15–500)
Eosinophils Relative: 3 %
HEMATOCRIT: 43.9 % (ref 35.0–45.0)
HEMOGLOBIN: 14.4 g/dL (ref 11.7–15.5)
Lymphocytes Relative: 27 %
Lymphs Abs: 1296 cells/uL (ref 850–3900)
MCH: 28.6 pg (ref 27.0–33.0)
MCHC: 32.8 g/dL (ref 32.0–36.0)
MCV: 87.3 fL (ref 80.0–100.0)
MONO ABS: 432 {cells}/uL (ref 200–950)
MONOS PCT: 9 %
MPV: 10.4 fL (ref 7.5–12.5)
NEUTROS ABS: 2928 {cells}/uL (ref 1500–7800)
Neutrophils Relative %: 61 %
Platelets: 214 10*3/uL (ref 140–400)
RBC: 5.03 MIL/uL (ref 3.80–5.10)
RDW: 14.9 % (ref 11.0–15.0)
WBC: 4.8 10*3/uL (ref 3.8–10.8)

## 2015-09-21 LAB — LIPID PANEL
Cholesterol: 206 mg/dL — ABNORMAL HIGH (ref 125–200)
HDL: 62 mg/dL (ref >=46)
LDL CALC: 110 mg/dL (ref <130)
TRIGLYCERIDES: 171 mg/dL — AB (ref <150)
Total CHOL/HDL Ratio: 3.3 Ratio (ref <=5.0)
VLDL: 34 mg/dL — AB (ref <30)

## 2015-09-22 LAB — VITAMIN D 25 HYDROXY (VIT D DEFICIENCY, FRACTURES): Vit D, 25-Hydroxy: 51 ng/mL (ref 30–100)

## 2015-09-22 LAB — TSH: TSH: 2.49 m[IU]/L

## 2015-09-24 ENCOUNTER — Other Ambulatory Visit (HOSPITAL_COMMUNITY)
Admission: RE | Admit: 2015-09-24 | Discharge: 2015-09-24 | Disposition: A | Discharge status: Home / Self Care | Payer: Medicare Other | Source: Ambulatory Visit | Attending: Internal Medicine | Admitting: Internal Medicine

## 2015-09-24 ENCOUNTER — Encounter: Payer: Self-pay | Admitting: Internal Medicine

## 2015-09-24 ENCOUNTER — Ambulatory Visit (INDEPENDENT_AMBULATORY_CARE_PROVIDER_SITE_OTHER): Payer: Medicare Other | Admitting: Internal Medicine

## 2015-09-24 VITALS — BP 124/68 | HR 90 | Temp 98.2°F | Resp 18 | Ht 64.0 in | Wt 160.5 lb

## 2015-09-24 DIAGNOSIS — E785 Hyperlipidemia, unspecified: Secondary | ICD-10-CM | POA: Diagnosis not present

## 2015-09-24 DIAGNOSIS — Z0000 Encounter for general adult medical examination without abnormal findings: Secondary | ICD-10-CM | POA: Diagnosis not present

## 2015-09-24 DIAGNOSIS — Z8669 Personal history of other diseases of the nervous system and sense organs: Secondary | ICD-10-CM | POA: Diagnosis not present

## 2015-09-24 DIAGNOSIS — Z124 Encounter for screening for malignant neoplasm of cervix: Secondary | ICD-10-CM | POA: Diagnosis not present

## 2015-09-24 DIAGNOSIS — Z8719 Personal history of other diseases of the digestive system: Secondary | ICD-10-CM

## 2015-09-24 DIAGNOSIS — G5713 Meralgia paresthetica, bilateral lower limbs: Secondary | ICD-10-CM

## 2015-09-24 DIAGNOSIS — K635 Polyp of colon: Secondary | ICD-10-CM | POA: Diagnosis not present

## 2015-09-24 DIAGNOSIS — Z87898 Personal history of other specified conditions: Secondary | ICD-10-CM

## 2015-09-24 DIAGNOSIS — G4733 Obstructive sleep apnea (adult) (pediatric): Secondary | ICD-10-CM

## 2015-09-24 DIAGNOSIS — Z8709 Personal history of other diseases of the respiratory system: Secondary | ICD-10-CM

## 2015-09-24 DIAGNOSIS — Z860101 Personal history of adenomatous and serrated colon polyps: Secondary | ICD-10-CM

## 2015-09-24 DIAGNOSIS — M7662 Achilles tendinitis, left leg: Secondary | ICD-10-CM | POA: Diagnosis not present

## 2015-09-24 DIAGNOSIS — Z86010 Personal history of colonic polyps: Secondary | ICD-10-CM | POA: Diagnosis not present

## 2015-09-24 DIAGNOSIS — M7661 Achilles tendinitis, right leg: Secondary | ICD-10-CM | POA: Diagnosis not present

## 2015-09-24 NOTE — Patient Instructions (Addendum)
Try Aleve bid for ankle pain. Call if not better in 4 weeks. To get prevnar soon RTC 6 months. Continue same medications.

## 2015-09-24 NOTE — Progress Notes (Signed)
Subjective:    Patient ID: Dawn Herrera, female    DOB: 1950-07-18, 65 y.o.   MRN: [id number]  HPI 65 year old Female for Welcome to Medicare examAnd evaluation of medical issues. She has a history of hyperlipidemia, exacerbations of COPD, history of smoking, microscopic hematuria, benign positional vertigo. Currently on Crestor for hyperlipidemia. History of vitamin D deficiency and adenomatous colon polyp. Had colonoscopy in 2007 by Dr. Michail Sermon. This showed one small adenomatous polyp in the descending colon. She had repeat colonoscopy January 2012 showing hyperplastic polyps.  Dr. Lawerance Bach evaluated her for microscopic hematuria in 2007 with normal cystoscopy and ureteroscopic.  History of snoring and obstructive sleep apnea. Bone density study done January 2014. History of osteopenia.  Patient saw Dr. Daylene Katayama in 2009 for impingement right shoulder with mild adhesive capsulitis.  Tubal ligation 1994.  No known drug allergies  Social history: She is married. Completed one year college. Husband is retired. She smoked one half to a pack of cigarettes daily for over 30 years. Has about 2 alcoholic drinks a week. 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 2 adult daughters.  Had issues with benign positional vertigo October 2015 that responded to Epley maneuver.  Family history: Father died at age 45 of congestive heart failure. Mother with history of coronary artery disease, MI, status post CABG. One brother in good health. 4 sisters in good health.  In June 2015 she had an episode of epigastric pain. She had been diagnosed with H. pylori and treated with 3 drug regimen. She saw Dr. Ardis Hughs who performed endoscopy showing duodenitis and gastritis along with hiatal hernia.    Review of Systems  Genitourinary: Negative.   Musculoskeletal:       Bilateral pain in the Achilles region. Also has bilateral lateral paresthesias and discomfort  consistent with meralgia paresthetica  Skin: Negative.   Psychiatric/Behavioral: Negative.   All other systems reviewed and are negative.      Objective:   Physical Exam  Constitutional: She appears well-developed and well-nourished.  HENT:  Head: Normocephalic and atraumatic.  Right Ear: External ear normal.  Left Ear: External ear normal.  Mouth/Throat: Oropharynx is clear and moist. No oropharyngeal exudate.  Eyes: Conjunctivae and EOM are normal. Pupils are equal, round, and reactive to light. Right eye exhibits no discharge. Left eye exhibits no discharge.  Neck: Neck supple. No JVD present. No thyromegaly present.  Cardiovascular: Normal rate, regular rhythm and normal heart sounds.   No murmur heard. Pulmonary/Chest: No respiratory distress. She has no wheezes. She has no rales.  Breasts normal female without masses  Abdominal: Soft. Bowel sounds are normal. She exhibits no distension and no mass. There is no tenderness. There is no rebound and no guarding.  Genitourinary:  Genitourinary Comments: Pap taken. Bimanual normal. Rectovaginal confirms.  Musculoskeletal: She exhibits no edema.  Neurological: She has normal reflexes. No cranial nerve deficit. Coordination normal.  Skin: Skin is warm and dry. No rash noted.  Psychiatric: She has a normal mood and affect. Her behavior is normal. Judgment and thought content normal.  Vitals reviewed.         Assessment & Plan:  Hyperlipidemia-stable on statin medication. Triglycerides are 171 and previously were 177.  Microscopic hematuria previously evaluated by urologist  History of smoking.  History of adenomatous colon polyp 2007  History of hyperplastic colon polyp 2012  Obstructive sleep apnea  Osteopenia  COPD with intermittent exacerbations  History of benign positional vertigo  Meralgia paresthetica  Achilles tendinitis both lower extremities  Plan: Continue same medications and return in 6 months. Try  Aleve for ankle pain. Call if not better in 4 weeks. To get Prevnar soon. Continue same medications.  Subjective:   Patient presents for Medicare Annual/Subsequent preventive examination.  Review Past Medical/Family/Social:See above   Risk Factors  Current exercise habits: Doesn't exercise a lot Dietary issues discussed: Low fat low car  Cardiac risk factors:Family history, hyperlipidemia and history of smoking  Depression Screen  (Note: if answer to either of the following is "Yes", a more complete depression screening is indicated)   Over the past two weeks, have you felt down, depressed or hopeless? No  Over the past two weeks, have you felt little interest or pleasure in doing things? No Have you lost interest or pleasure in daily life? No Do you often feel hopeless? No Do you cry easily over simple problems? No   Activities of Daily Living  In your present state of health, do you have any difficulty performing the following activities?:   Driving? No  Managing money? No  Feeding yourself? No  Getting from bed to chair? No  Climbing a flight of stairs? No  Preparing food and eating?: No  Bathing or showering? No  Getting dressed: No  Getting to the toilet? No  Using the toilet:No  Moving around from place to place: No  In the past year have you fallen or had a near fall?:No  Are you sexually active? No  Do you have more than one partner? No   Hearing Difficulties: No  Do you often ask people to speak up or repeat themselves? Yes Do you experience ringing or noises in your ears? Yes Do you have difficulty understanding soft or whispered voices? Yes Do you feel that you have a problem with memory? No Do you often misplace items? No    Home Safety:  Do you have a smoke alarm at your residence? Yes Do you have grab bars in the bathroom?Yes Do you have throw rugs in your house? Yes   Cognitive Testing  Alert? Yes Normal Appearance?Yes  Oriented to person? Yes  Place? Yes  Time? Yes  Recall of three objects? Yes  Can perform simple calculations? Yes  Displays appropriate judgment?Yes  Can read the correct time from a watch face?Yes   List the Names of Other Physician/Practitioners you currently use:  See referral list for the physicians patient is currently seeing.     Review of Systems: See above   Objective:     General appearance: Appears stated age and mildly obese  Head: Normocephalic, without obvious abnormality, atraumatic  Eyes: conj clear, EOMi PEERLA  Ears: normal TM's and external ear canals both ears  Nose: Nares normal. Septum midline. Mucosa normal. No drainage or sinus tenderness.  Throat: lips, mucosa, and tongue normal; teeth and gums normal  Neck: no adenopathy, no carotid bruit, no JVD, supple, symmetrical, trachea midline and thyroid not enlarged, symmetric, no tenderness/mass/nodules  No CVA tenderness.  Lungs: clear to auscultation bilaterally  Breasts: normal appearance, no masses or tenderness,  Heart: regular rate and rhythm, S1, S2 normal, no murmur, click, rub or gallop  Abdomen: soft, non-tender; bowel sounds normal; no masses, no organomegaly  Musculoskeletal: ROM normal in all joints, no crepitus, no deformity, Normal muscle strengthen. Back  is symmetric, no curvature. Skin: Skin color, texture, turgor normal. No rashes or lesions  Lymph nodes: Cervical,  supraclavicular, and axillary nodes normal.  Neurologic: CN 2 -12 Normal, Normal symmetric reflexes. Normal coordination and gait  Psych: Alert & Oriented x 3, Mood appear stable.    Assessment:    Annual wellness medicare exam   Plan:    During the course of the visit the patient was educated and counseled about appropriate screening and preventive services including:   Annual mammogram  Annual flu vaccine recommended  To get Prevnar  Had tetanus immunization 2007     Patient Instructions (the written plan) was given to the patient.    Medicare Attestation  I have personally reviewed:  The patient's medical and social history  Their use of alcohol, tobacco or illicit drugs  Their current medications and supplements  The patient's functional ability including ADLs,fall risks, home safety risks, cognitive, and hearing and visual impairment  Diet and physical activities  Evidence for depression or mood disorders  The patient's weight, height, BMI, and visual acuity have been recorded in the chart. I have made referrals, counseling, and provided education to the patient based on review of the above and I have provided the patient with a written personalized care plan for preventive services.

## 2015-09-28 LAB — CYTOLOGY - PAP

## 2015-10-01 ENCOUNTER — Ambulatory Visit (INDEPENDENT_AMBULATORY_CARE_PROVIDER_SITE_OTHER): Payer: Medicare Other | Admitting: Internal Medicine

## 2015-10-01 DIAGNOSIS — Z0000 Encounter for general adult medical examination without abnormal findings: Secondary | ICD-10-CM

## 2015-12-03 ENCOUNTER — Encounter: Payer: Self-pay | Admitting: Internal Medicine

## 2015-12-03 ENCOUNTER — Ambulatory Visit (INDEPENDENT_AMBULATORY_CARE_PROVIDER_SITE_OTHER): Payer: Medicare Other | Admitting: Internal Medicine

## 2015-12-03 VITALS — BP 110/78 | HR 66 | Temp 97.4°F | Wt 157.5 lb

## 2015-12-03 DIAGNOSIS — G4733 Obstructive sleep apnea (adult) (pediatric): Secondary | ICD-10-CM | POA: Diagnosis not present

## 2015-12-03 DIAGNOSIS — J069 Acute upper respiratory infection, unspecified: Secondary | ICD-10-CM

## 2015-12-03 DIAGNOSIS — Z8709 Personal history of other diseases of the respiratory system: Secondary | ICD-10-CM

## 2015-12-03 MED ORDER — HYDROCODONE-HOMATROPINE 5-1.5 MG/5ML PO SYRP: 5.0000 mL | ORAL_SOLUTION | Freq: Three times a day (TID) | ORAL | 0 refills | Status: DC | PRN

## 2015-12-03 MED ORDER — LEVOFLOXACIN 500 MG PO TABS: 500.0000 mg | ORAL_TABLET | Freq: Every day | ORAL | 0 refills | Status: DC

## 2015-12-03 NOTE — Patient Instructions (Signed)
Rest and drink plenty of fluids. Take Levaquin 500 milligrams daily for 10 days. Hycodan 1 teaspoon every 8 hours as needed for cough. Depo-Medrol 80 mg given IM today.

## 2015-12-03 NOTE — Progress Notes (Signed)
   Subjective:    Patient ID: Dawn Herrera, female    DOB: Nov 18, 1950, 65 y.o.   MRN: [id number]  HPI 65 year old Female in today with respiratory infection for several days. First thought she had allergic rhinitis symptoms. Has sore throat cough and congestion. No discolored sputum but white sputum production. History of sleep apnea and COPD. Quit smoking recently. No fever or chills.    Review of Systems see above     Objective:   Physical Exam Skin warm and dry. Nodes none. TMs clear. Pharynx slightly injected. Neck is supple without adenopathy. Chest clear without rales or wheezing. Has deep congested cough.       Assessment & Plan:  Acute URI  Plan: Levaquin 500 milligrams daily for 10 days. Depo-Medrol 80 mg IM to help with cough and congestion. Doesn't like taking oral steroids. Hycodan 1 teaspoon by mouth every 8 hours when necessary cough.

## 2015-12-07 DIAGNOSIS — G4733 Obstructive sleep apnea (adult) (pediatric): Secondary | ICD-10-CM | POA: Diagnosis not present

## 2016-01-17 ENCOUNTER — Ambulatory Visit: Payer: Medicare Other | Admitting: Pulmonary Disease

## 2016-01-27 ENCOUNTER — Encounter: Payer: Self-pay | Admitting: Pulmonary Disease

## 2016-01-27 ENCOUNTER — Ambulatory Visit (INDEPENDENT_AMBULATORY_CARE_PROVIDER_SITE_OTHER): Payer: Medicare Other | Admitting: Pulmonary Disease

## 2016-01-27 DIAGNOSIS — G4733 Obstructive sleep apnea (adult) (pediatric): Secondary | ICD-10-CM

## 2016-01-27 NOTE — Patient Instructions (Signed)
We will reviewed download and give your feedback You may need repeat home sleep study after your dental appliance is adjusted

## 2016-01-27 NOTE — Progress Notes (Signed)
   Subjective:    Patient ID: Dawn Herrera, female    DOB: [birth year], 65 y.o.   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  01/27/2016  Chief Complaint  Patient presents with  . Follow-up    yearly rov for OSA.  pt denies any problems with cpap.     On her last visit 12/2014 we reduced her CPAP to auto settings 5-12 cm This has worked very well for her and she is able to use her machine and be compliant with at least 4-6 hours every night We also changed her DME and she is happy with her current She has changed to a nasal mask and denies any problems with mask and pressure    Has seen dr Ron Parker - has invisalign and is planning to have an oral appliance made     Review of Systems Patient denies significant dyspnea,cough, hemoptysis,  chest pain, palpitations, pedal edema, orthopnea, paroxysmal nocturnal dyspnea, lightheadedness, nausea, vomiting, abdominal or  leg pains      Objective:   Physical Exam   Gen. Pleasant, well-nourished, in no distress ENT - no lesions, no post nasal drip Neck: No JVD, no thyromegaly, no carotid bruits Lungs: no use of accessory muscles, no dullness to percussion, clear without rales or rhonchi  Cardiovascular: Rhythm regular, heart sounds  normal, no murmurs or gallops, no peripheral edema Musculoskeletal: No deformities, no cyanosis or clubbing          Assessment & Plan:

## 2016-01-27 NOTE — Assessment & Plan Note (Signed)
We will review download and give your feedback You may need repeat home sleep study after your dental appliance is adjusted  Weight loss encouraged, compliance with goal of at least 4-6 hrs every night is the expectation. Advised against medications with sedative side effects Cautioned against driving when sleepy - understanding that sleepiness will vary on a day to day basis s

## 2016-02-01 ENCOUNTER — Telehealth: Payer: Self-pay | Admitting: Pulmonary Disease

## 2016-02-01 ENCOUNTER — Encounter: Payer: Self-pay | Admitting: Pulmonary Disease

## 2016-02-01 DIAGNOSIS — G4733 Obstructive sleep apnea (adult) (pediatric): Secondary | ICD-10-CM

## 2016-02-01 NOTE — Telephone Encounter (Signed)
LMOMTCB x 1 

## 2016-02-04 NOTE — Telephone Encounter (Signed)
lmomtcb 2

## 2016-02-07 NOTE — Telephone Encounter (Signed)
Please leave  my look-at folder at my desk

## 2016-02-07 NOTE — Telephone Encounter (Signed)
Called and spoke to pt. Informed her that once RA has review the DL we will call with results and recs. Pt verbalized understanding and denied any further questions or concerns at this time.    Dr. Elsworth Soho please advise on pt's DL. Thanks.

## 2016-02-07 NOTE — Telephone Encounter (Signed)
DL located in RA's paperwork Paperwork placed in blue folder and placed on RA's desk

## 2016-02-10 NOTE — Telephone Encounter (Signed)
Per Dr Elsworth Soho: Dawn Herrera showed good CPAP compliance on Avg pressure of 11cm CPAP works well when used.  Please change to fixed pressure of 11cm.  Thanks.

## 2016-02-11 NOTE — Telephone Encounter (Signed)
Called and spoke with pt and she is aware of results and of order sent to [facility] to set her CPAP at fixed pressure of 11cm.  Nothing further is needed.

## 2016-02-25 DIAGNOSIS — G4733 Obstructive sleep apnea (adult) (pediatric): Secondary | ICD-10-CM | POA: Diagnosis not present

## 2016-03-17 DIAGNOSIS — G4733 Obstructive sleep apnea (adult) (pediatric): Secondary | ICD-10-CM | POA: Diagnosis not present

## 2016-03-23 ENCOUNTER — Telehealth: Payer: Self-pay | Admitting: Pulmonary Disease

## 2016-03-23 DIAGNOSIS — G4733 Obstructive sleep apnea (adult) (pediatric): Secondary | ICD-10-CM

## 2016-03-23 NOTE — Telephone Encounter (Signed)
LM x 1 

## 2016-03-24 NOTE — Telephone Encounter (Signed)
Spoke with pt. States that her CPAP pressure needs to be changed. Reports that her current pressure of 11cm is to strong. Pt would like to have this turned down.  RA - please advise. Thanks.

## 2016-03-28 NOTE — Telephone Encounter (Signed)
Please ask DME to change back to auto settings 5-12 cm

## 2016-03-28 NOTE — Telephone Encounter (Signed)
RA - please advise. Thanks! 

## 2016-03-28 NOTE — Telephone Encounter (Signed)
Order sent to [facility] to change settings back to auto 5-12 cm LMOM TCB x1 to inform pt of the above

## 2016-03-30 ENCOUNTER — Other Ambulatory Visit: Payer: Medicare Other | Admitting: Internal Medicine

## 2016-03-31 ENCOUNTER — Ambulatory Visit: Payer: Medicare Other | Admitting: Internal Medicine

## 2016-03-31 NOTE — Telephone Encounter (Signed)
Spoke with the pt and notified of recs per RA  Nothing further needed 

## 2016-04-20 ENCOUNTER — Other Ambulatory Visit: Payer: Medicare Other | Admitting: Internal Medicine

## 2016-04-20 DIAGNOSIS — E785 Hyperlipidemia, unspecified: Secondary | ICD-10-CM | POA: Diagnosis not present

## 2016-04-20 DIAGNOSIS — Z79899 Other long term (current) drug therapy: Secondary | ICD-10-CM | POA: Diagnosis not present

## 2016-04-20 DIAGNOSIS — R7309 Other abnormal glucose: Secondary | ICD-10-CM

## 2016-04-20 LAB — HEPATIC FUNCTION PANEL
ALBUMIN: 4.5 g/dL (ref 3.6–5.1)
ALT: 29 U/L (ref 6–29)
AST: 27 U/L (ref 10–35)
Alkaline Phosphatase: 101 U/L (ref 33–130)
BILIRUBIN TOTAL: 0.5 mg/dL (ref 0.2–1.2)
Bilirubin, Direct: 0.1 mg/dL (ref <=0.2)
Indirect Bilirubin: 0.4 mg/dL (ref 0.2–1.2)
TOTAL PROTEIN: 7.4 g/dL (ref 6.1–8.1)

## 2016-04-20 LAB — LIPID PANEL
CHOL/HDL RATIO: 3.9 ratio (ref <5.0)
Cholesterol: 268 mg/dL — ABNORMAL HIGH (ref <200)
HDL: 69 mg/dL (ref >50)
LDL Cholesterol: 153 mg/dL — ABNORMAL HIGH (ref <100)
Triglycerides: 229 mg/dL — ABNORMAL HIGH (ref <150)
VLDL: 46 mg/dL — ABNORMAL HIGH (ref <30)

## 2016-04-21 ENCOUNTER — Ambulatory Visit (INDEPENDENT_AMBULATORY_CARE_PROVIDER_SITE_OTHER): Payer: Medicare Other | Admitting: Internal Medicine

## 2016-04-21 ENCOUNTER — Encounter: Payer: Self-pay | Admitting: Internal Medicine

## 2016-04-21 VITALS — BP 110/70 | HR 63 | Ht 64.0 in | Wt 157.0 lb

## 2016-04-21 DIAGNOSIS — R7302 Impaired glucose tolerance (oral): Secondary | ICD-10-CM

## 2016-04-21 DIAGNOSIS — E782 Mixed hyperlipidemia: Secondary | ICD-10-CM | POA: Diagnosis not present

## 2016-04-21 LAB — HEMOGLOBIN A1C
Hgb A1c MFr Bld: 5.7 % — ABNORMAL HIGH (ref <5.7)
MEAN PLASMA GLUCOSE: 117 mg/dL

## 2016-04-21 LAB — MICROALBUMIN / CREATININE URINE RATIO
Creatinine, Urine: 148 mg/dL (ref 20–320)
Microalb Creat Ratio: 7 mcg/mg creat (ref <30)
Microalb, Ur: 1 mg/dL

## 2016-04-21 NOTE — Progress Notes (Signed)
   Subjective:    Patient ID: Dawn Herrera, female    DOB: 10-11-1950, 66 y.o.   MRN: [id number]  HPI 66 year old female with history of hyperlipidemia on Crestor 5 mg daily for follow-up of hyperlipidemia and impaired glucose tolerance. She just got back this past weekend from a 2 week stay in [place]. 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 May have missed a few days of medication. Certainly her total cholesterol is elevated to 68 and that would be compatible with missing a few doses or not following a strict diet. Total cholesterol is 268 and was 2067 months ago. Triglycerides 229 and were 171 when checked 7 months ago. LDL cholesterol is 153 and 7 months ago triglycerides were 110.  Hemoglobin A1c is 5.7% and stable    Review of Systems see above. New issue today is she snaking about having cool sculpting under her chin. She's researched several plastic surgeons.     Objective:   Physical Exam  Not examined. 20 minutes speaking with her about diet exercise and weight loss. She says she has been working out and trying to follow a low-fat diet.      Assessment & Plan:  Hyperlipidemia  Impaired glucose tolerance  Plan: Patient is to return in 8 weeks for fasting lipid panel. She'll continue Crestor 5 mg daily. We discussed increasing the medication and she says when she was on a higher dose it was causing myalgias. Watch diet and exercise.

## 2016-04-21 NOTE — Patient Instructions (Signed)
Continue Crestor 5 mg daily. Watch diet and exercise and return in 8 weeks for fasting lipid panel.

## 2016-04-24 ENCOUNTER — Ambulatory Visit: Payer: Self-pay | Admitting: Internal Medicine

## 2016-06-13 ENCOUNTER — Other Ambulatory Visit: Payer: Self-pay | Admitting: Internal Medicine

## 2016-06-13 ENCOUNTER — Other Ambulatory Visit: Payer: Medicare Other | Admitting: Internal Medicine

## 2016-06-13 DIAGNOSIS — E785 Hyperlipidemia, unspecified: Secondary | ICD-10-CM | POA: Diagnosis not present

## 2016-06-13 LAB — LIPID PANEL
CHOL/HDL RATIO: 3.5 ratio (ref <5.0)
Cholesterol: 200 mg/dL — ABNORMAL HIGH (ref <200)
HDL: 57 mg/dL (ref >50)
LDL CALC: 101 mg/dL — AB (ref <100)
Triglycerides: 211 mg/dL — ABNORMAL HIGH (ref <150)
VLDL: 42 mg/dL — ABNORMAL HIGH (ref <30)

## 2016-06-20 DIAGNOSIS — G4733 Obstructive sleep apnea (adult) (pediatric): Secondary | ICD-10-CM | POA: Diagnosis not present

## 2016-07-05 NOTE — Progress Notes (Signed)
Labs drawn

## 2016-07-14 ENCOUNTER — Other Ambulatory Visit: Payer: Self-pay

## 2016-07-14 MED ORDER — ROSUVASTATIN CALCIUM 5 MG PO TABS: 5.0000 mg | ORAL_TABLET | Freq: Every day | ORAL | 99 refills | Status: DC

## 2016-09-26 ENCOUNTER — Other Ambulatory Visit: Payer: Medicare Other | Admitting: Internal Medicine

## 2016-09-26 ENCOUNTER — Other Ambulatory Visit: Payer: Self-pay | Admitting: Internal Medicine

## 2016-09-26 DIAGNOSIS — E559 Vitamin D deficiency, unspecified: Secondary | ICD-10-CM

## 2016-09-26 DIAGNOSIS — E785 Hyperlipidemia, unspecified: Secondary | ICD-10-CM

## 2016-09-26 DIAGNOSIS — M8580 Other specified disorders of bone density and structure, unspecified site: Secondary | ICD-10-CM

## 2016-09-26 DIAGNOSIS — R7302 Impaired glucose tolerance (oral): Secondary | ICD-10-CM

## 2016-09-26 DIAGNOSIS — Z1329 Encounter for screening for other suspected endocrine disorder: Secondary | ICD-10-CM

## 2016-09-26 DIAGNOSIS — Z0000 Encounter for general adult medical examination without abnormal findings: Secondary | ICD-10-CM | POA: Diagnosis not present

## 2016-09-26 LAB — TSH: TSH: 2.74 mIU/L

## 2016-09-26 LAB — LIPID PANEL
CHOLESTEROL: 247 mg/dL — AB (ref <200)
HDL: 57 mg/dL (ref >50)
LDL Cholesterol: 142 mg/dL — ABNORMAL HIGH (ref <100)
TRIGLYCERIDES: 241 mg/dL — AB (ref <150)
Total CHOL/HDL Ratio: 4.3 Ratio (ref <5.0)
VLDL: 48 mg/dL — AB (ref <30)

## 2016-09-26 LAB — COMPLETE METABOLIC PANEL WITH GFR
ALK PHOS: 104 U/L (ref 33–130)
ALT: 30 U/L — ABNORMAL HIGH (ref 6–29)
AST: 29 U/L (ref 10–35)
Albumin: 4.2 g/dL (ref 3.6–5.1)
BUN: 16 mg/dL (ref 7–25)
CO2: 22 mmol/L (ref 20–31)
Calcium: 10 mg/dL (ref 8.6–10.4)
Chloride: 105 mmol/L (ref 98–110)
Creat: 0.97 mg/dL (ref 0.50–0.99)
GFR, EST NON AFRICAN AMERICAN: 61 mL/min (ref >=60)
GFR, Est African American: 70 mL/min (ref >=60)
GLUCOSE: 106 mg/dL — AB (ref 65–99)
POTASSIUM: 4.9 mmol/L (ref 3.5–5.3)
SODIUM: 141 mmol/L (ref 135–146)
Total Bilirubin: 0.5 mg/dL (ref 0.2–1.2)
Total Protein: 6.9 g/dL (ref 6.1–8.1)

## 2016-09-26 LAB — CBC WITH DIFFERENTIAL/PLATELET
BASOS ABS: 0 {cells}/uL (ref 0–200)
Basophils Relative: 0 %
EOS PCT: 3 %
Eosinophils Absolute: 141 cells/uL (ref 15–500)
HCT: 45.4 % — ABNORMAL HIGH (ref 35.0–45.0)
Hemoglobin: 15.1 g/dL (ref 11.7–15.5)
Lymphocytes Relative: 29 %
Lymphs Abs: 1363 cells/uL (ref 850–3900)
MCH: 29 pg (ref 27.0–33.0)
MCHC: 33.3 g/dL (ref 32.0–36.0)
MCV: 87.3 fL (ref 80.0–100.0)
MONOS PCT: 12 %
MPV: 10.3 fL (ref 7.5–12.5)
Monocytes Absolute: 564 cells/uL (ref 200–950)
NEUTROS PCT: 56 %
Neutro Abs: 2632 cells/uL (ref 1500–7800)
PLATELETS: 212 10*3/uL (ref 140–400)
RBC: 5.2 MIL/uL — ABNORMAL HIGH (ref 3.80–5.10)
RDW: 14.6 % (ref 11.0–15.0)
WBC: 4.7 10*3/uL (ref 3.8–10.8)

## 2016-09-27 LAB — MICROALBUMIN / CREATININE URINE RATIO
Creatinine, Urine: 72 mg/dL (ref 20–320)
MICROALB/CREAT RATIO: 4 ug/mg{creat} (ref <30)
Microalb, Ur: 0.3 mg/dL

## 2016-09-27 LAB — HEMOGLOBIN A1C
Hgb A1c MFr Bld: 6 % — ABNORMAL HIGH (ref <5.7)
Mean Plasma Glucose: 126 mg/dL

## 2016-09-28 ENCOUNTER — Encounter: Payer: Self-pay | Admitting: Internal Medicine

## 2016-09-28 ENCOUNTER — Ambulatory Visit (INDEPENDENT_AMBULATORY_CARE_PROVIDER_SITE_OTHER): Payer: Medicare Other | Admitting: Internal Medicine

## 2016-09-28 VITALS — BP 104/62 | HR 80 | Temp 97.7°F | Ht 63.75 in | Wt 165.0 lb

## 2016-09-28 DIAGNOSIS — M722 Plantar fascial fibromatosis: Secondary | ICD-10-CM

## 2016-09-28 DIAGNOSIS — G4733 Obstructive sleep apnea (adult) (pediatric): Secondary | ICD-10-CM

## 2016-09-28 DIAGNOSIS — Z8709 Personal history of other diseases of the respiratory system: Secondary | ICD-10-CM | POA: Diagnosis not present

## 2016-09-28 DIAGNOSIS — Z0000 Encounter for general adult medical examination without abnormal findings: Secondary | ICD-10-CM

## 2016-09-28 DIAGNOSIS — R7302 Impaired glucose tolerance (oral): Secondary | ICD-10-CM

## 2016-09-28 DIAGNOSIS — Z860101 Personal history of adenomatous and serrated colon polyps: Secondary | ICD-10-CM

## 2016-09-28 DIAGNOSIS — Z86010 Personal history of colonic polyps: Secondary | ICD-10-CM

## 2016-09-28 DIAGNOSIS — E782 Mixed hyperlipidemia: Secondary | ICD-10-CM

## 2016-09-28 LAB — POCT URINALYSIS DIPSTICK
BILIRUBIN UA: NEGATIVE
Blood, UA: NEGATIVE
GLUCOSE UA: NEGATIVE
KETONES UA: NEGATIVE
LEUKOCYTES UA: NEGATIVE
Nitrite, UA: NEGATIVE
Protein, UA: NEGATIVE
Spec Grav, UA: 1.01 (ref 1.010–1.025)
Urobilinogen, UA: 0.2 E.U./dL
pH, UA: 6.5 (ref 5.0–8.0)

## 2016-09-28 NOTE — Progress Notes (Addendum)
Subjective:    Patient ID: Dawn Herrera, female    DOB: Dec 21, 1950, 66 y.o.   MRN: [id number]  HPI 66 year old Female for health maintenance exam and evaluation of medical issues. Elevated serum glucose. Lipids have increased once again.  Semiretired working 2-3 days a week. Traveling a lot with her husband. Sometimes misses her dose of Crestor..  Family history: Father hx DM at older age. He died at age 8 of congestive heart failure. Mother with history of coronary artery disease, MI, status post CABG. One brother in good health. 4 sisters in good health.  Social history: She is married. Completed one year college. Husband is retired. She smoked one half to a pack of cigarettes daily for over 30 years. Has about 2 alcoholic drinks a week. 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 2 adult daughters.  In June 2015, she had an episode of epigastric pain. She had been diagnosed with H. pylori and was treated with 3 drug regimen. She saw Dr. Yates Decamp performed endoscopy showing duodenitis and gastritis along with hiatal hernia.  Issues with benign positional vertigo October 2015 that responded to Epley maneuver.  History of vitamin D deficiency and adenomatous colon polyp. She had colonoscopy in 2007 by Dr. Michail Sermon. This showed one small adenomatous polyp in the descending colon. She had repeat colonoscopy January 2012 showing hyperplastic polyps.  History of snoring and obstructive sleep apnea.  Bone that study study done January 2014 with history of osteopenia.  Dr. Lawerance Bach evaluated her for microscopic hematuria in 2007 with normal cystoscopy and ureteroscopic.  She saw Dr. Daylene Katayama in 2009 for impingement right shoulder with mild adhesive capsulitis.  No known drug allergies  Tubal ligation 1994.         Review of Systems  Constitutional: Negative.   All other systems reviewed and are negative.  itching in the external genital area-prescribed  Lotrisone     Objective:   Physical Exam  Constitutional: She is oriented to person, place, and time. She appears well-developed and well-nourished. No distress.  HENT:  Head: Normocephalic and atraumatic.  Right Ear: External ear normal.  Left Ear: External ear normal.  Mouth/Throat: Oropharynx is clear and moist. No oropharyngeal exudate.  Eyes: Pupils are equal, round, and reactive to light. Conjunctivae and EOM are normal. Right eye exhibits no discharge. Left eye exhibits no discharge. No scleral icterus.  Neck: Neck supple. No JVD present. No thyromegaly present.  Cardiovascular: Normal rate, regular rhythm, normal heart sounds and intact distal pulses.   No murmur heard. Pulmonary/Chest: Effort normal and breath sounds normal. No respiratory distress. She has no wheezes. She has no rales.  Breasts normal female without masses  Abdominal: Soft. Bowel sounds are normal. She exhibits no distension and no mass. There is no tenderness. There is no rebound and no guarding.  Genitourinary:  Genitourinary Comments: Pap taken 2017.  Musculoskeletal: She exhibits no edema.  Tender both heels consistent with plantar fasciitis  Lymphadenopathy:    She has no cervical adenopathy.  Neurological: She is alert and oriented to person, place, and time. She has normal reflexes. No cranial nerve deficit.  Skin: Skin is warm and dry. No rash noted. She is not diaphoretic.  Psychiatric: She has a normal mood and affect. Her behavior is normal. Judgment and thought content normal.  Vitals reviewed.         Assessment & Plan:  Impaired glucose tolerance-hemoglobin A1c 5.9%. Work on  diet and exercise and recheck in January.  Hyperlipidemia-mixed-total cholesterol 247 and 3 months ago was 200. Triglycerides are 241 and previously 3 months ago were 211. LDL has increased from 101-142. Patient has been traveling. It is possible she has missed some doses of Crestor. Recheck in January.  Plantar  fasciitis-does not want injections and he'll. Exercises demonstrated.  History of COPD  Obstructive sleep apnea  History of adenomatous colon polyp  Plan: Return in January to follow-up on lipids and impaired glucose tolerance. Watch diet. Take Crestor regularly.  Subjective:   Patient presents for Medicare Annual/Subsequent preventive examination.  Review Past Medical/Family/Social:See above   Risk Factors  Current exercise habits: Some walking Dietary issues discussed: Low fat low carbohydrate  Cardiac risk factors: Hyperlipidemia and impaired glucose tolerance. Family history.  Depression Screen  (Note: if answer to either of the following is "Yes", a more complete depression screening is indicated)   Over the past two weeks, have you felt down, depressed or hopeless? No  Over the past two weeks, have you felt little interest or pleasure in doing things? No Have you lost interest or pleasure in daily life? No Do you often feel hopeless? No Do you cry easily over simple problems? No   Activities of Daily Living  In your present state of health, do you have any difficulty performing the following activities?:   Driving? No  Managing money? No  Feeding yourself? No  Getting from bed to chair? No  Climbing a flight of stairs? No  Preparing food and eating?: No  Bathing or showering? No  Getting dressed: No  Getting to the toilet? No  Using the toilet:No  Moving around from place to place: No  In the past year have you fallen or had a near fall?:No  Are you sexually active? No  Do you have more than one partner? No   Hearing Difficulties: No  Do you often ask people to speak up or repeat themselves?Yes Do you experience ringing or noises in your ears? Yes Do you have difficulty understanding soft or whispered voices? Yes Do you feel that you have a problem with memory? Some Do you often misplace items? No    Home Safety:  Do you have a smoke alarm at your  residence? Yes Do you have grab bars in the bathroom?Yes Do you have throw rugs in your house? Yes   Cognitive Testing  Alert? Yes Normal Appearance?Yes  Oriented to person? Yes Place? Yes  Time? Yes  Recall of three objects? Yes  Can perform simple calculations? Yes  Displays appropriate judgment?Yes  Can read the correct time from a watch face?Yes   List the Names of Other Physician/Practitioners you currently use:  See referral list for the physicians patient is currently seeing.     Review of Systems: See above   Objective:     General appearance: Appears stated age and mildly obese  Head: Normocephalic, without obvious abnormality, atraumatic  Eyes: conj clear, EOMi PEERLA  Ears: normal TM's and external ear canals both ears  Nose: Nares normal. Septum midline. Mucosa normal. No drainage or sinus tenderness.  Throat: lips, mucosa, and tongue normal; teeth and gums normal  Neck: no adenopathy, no carotid bruit, no JVD, supple, symmetrical, trachea midline and thyroid not enlarged, symmetric, no tenderness/mass/nodules  No CVA tenderness.  Lungs: clear to auscultation bilaterally  Breasts: normal appearance, no masses or tenderness,  Heart: regular rate and rhythm, S1, S2 normal, no murmur, click, rub or  gallop  Abdomen: soft, non-tender; bowel sounds normal; no masses, no organomegaly  Musculoskeletal: ROM normal in all joints, no crepitus, no deformity, Normal muscle strengthen. Back  is symmetric, no curvature. Skin: Skin color, texture, turgor normal. No rashes or lesions  Lymph nodes: Cervical, supraclavicular, and axillary nodes normal.  Neurologic: CN 2 -12 Normal, Normal symmetric reflexes. Normal coordination and gait  Psych: Alert & Oriented x 3, Mood appear stable.    Assessment:    Annual wellness medicare exam   Plan:    During the course of the visit the patient was educated and counseled about appropriate screening and preventive services including:   Annual mammogram      Patient Instructions (the written plan) was given to the patient.  Medicare Attestation  I have personally reviewed:  The patient's medical and social history  Their use of alcohol, tobacco or illicit drugs  Their current medications and supplements  The patient's functional ability including ADLs,fall risks, home safety risks, cognitive, and hearing and visual impairment  Diet and physical activities  Evidence for depression or mood disorders  The patient's weight, height, BMI, and visual acuity have been recorded in the chart. I have made referrals, counseling, and provided education to the patient based on review of the above and I have provided the patient with a written personalized care plan for preventive services.

## 2016-09-29 LAB — HEMOGLOBIN A1C
Hgb A1c MFr Bld: 5.9 % — ABNORMAL HIGH (ref <5.7)
MEAN PLASMA GLUCOSE: 123 mg/dL

## 2016-10-01 MED ORDER — CLOTRIMAZOLE-BETAMETHASONE 1-0.05 % EX CREA: 1.0000 "application " | TOPICAL_CREAM | Freq: Two times a day (BID) | CUTANEOUS | 2 refills | Status: DC

## 2016-10-01 NOTE — Patient Instructions (Addendum)
Please take Crestor on a regular basis. Watch diet and exercise. Return in January for lipid panel liver functions and hemoglobin A1c with follow-up. Exercises shown for plantar fasciitis.

## 2016-10-04 DIAGNOSIS — G4733 Obstructive sleep apnea (adult) (pediatric): Secondary | ICD-10-CM | POA: Diagnosis not present

## 2017-01-08 DIAGNOSIS — G4733 Obstructive sleep apnea (adult) (pediatric): Secondary | ICD-10-CM | POA: Diagnosis not present

## 2017-01-18 DIAGNOSIS — H5213 Myopia, bilateral: Secondary | ICD-10-CM | POA: Diagnosis not present

## 2017-01-18 DIAGNOSIS — H01002 Unspecified blepharitis right lower eyelid: Secondary | ICD-10-CM | POA: Diagnosis not present

## 2017-01-18 DIAGNOSIS — H01001 Unspecified blepharitis right upper eyelid: Secondary | ICD-10-CM | POA: Diagnosis not present

## 2017-01-18 DIAGNOSIS — H2513 Age-related nuclear cataract, bilateral: Secondary | ICD-10-CM | POA: Diagnosis not present

## 2017-01-18 DIAGNOSIS — H04123 Dry eye syndrome of bilateral lacrimal glands: Secondary | ICD-10-CM | POA: Diagnosis not present

## 2017-01-19 DIAGNOSIS — H903 Sensorineural hearing loss, bilateral: Secondary | ICD-10-CM | POA: Diagnosis not present

## 2017-03-20 ENCOUNTER — Other Ambulatory Visit: Payer: Self-pay | Admitting: Internal Medicine

## 2017-03-20 DIAGNOSIS — Z79899 Other long term (current) drug therapy: Secondary | ICD-10-CM

## 2017-03-20 DIAGNOSIS — E785 Hyperlipidemia, unspecified: Secondary | ICD-10-CM

## 2017-03-20 DIAGNOSIS — R7302 Impaired glucose tolerance (oral): Secondary | ICD-10-CM

## 2017-03-27 ENCOUNTER — Other Ambulatory Visit: Payer: Medicare Other | Admitting: Internal Medicine

## 2017-03-29 ENCOUNTER — Ambulatory Visit: Payer: Medicare Other | Admitting: Internal Medicine

## 2017-04-09 DIAGNOSIS — G4733 Obstructive sleep apnea (adult) (pediatric): Secondary | ICD-10-CM | POA: Diagnosis not present

## 2017-04-30 ENCOUNTER — Ambulatory Visit (INDEPENDENT_AMBULATORY_CARE_PROVIDER_SITE_OTHER): Payer: Medicare HMO

## 2017-04-30 ENCOUNTER — Ambulatory Visit: Payer: Medicare HMO | Admitting: Podiatry

## 2017-04-30 ENCOUNTER — Other Ambulatory Visit: Payer: Self-pay | Admitting: Podiatry

## 2017-04-30 ENCOUNTER — Encounter: Payer: Self-pay | Admitting: Podiatry

## 2017-04-30 DIAGNOSIS — M722 Plantar fascial fibromatosis: Secondary | ICD-10-CM

## 2017-04-30 DIAGNOSIS — R52 Pain, unspecified: Secondary | ICD-10-CM

## 2017-04-30 MED ORDER — MELOXICAM 15 MG PO TABS: 15.0000 mg | ORAL_TABLET | Freq: Every day | ORAL | 1 refills | Status: AC

## 2017-04-30 NOTE — Progress Notes (Signed)
   Subjective:    Patient ID: Dawn Herrera, female    DOB: 1950-10-06, 67 y.o.   MRN: [id number]  HPI  Chief Complaint  Patient presents with  . Foot Pain    Bilateral heel pain       Review of Systems  All other systems reviewed and are negative.      Objective:   Physical Exam        Assessment & Plan:

## 2017-05-02 NOTE — Progress Notes (Signed)
   Subjective: Patient presents today for intermittent pain and tenderness to the plantar aspect of the right heel that began about one year ago. Patient states that it hurts in the mornings with the first steps out of bed. She also states that sitting makes the pain worse. She has not done anything to treat the symptoms. Patient presents today for further treatment and evaluation.  Past Medical History:  Diagnosis Date  . Adenomatous polyp   . Allergic rhinitis   . Depression   . Hyperlipidemia   . Sleep apnea    wears mouth guard     Objective: Physical Exam General: The patient is alert and oriented x3 in no acute distress.  Dermatology: Skin is warm, dry and supple bilateral lower extremities. Negative for open lesions or macerations bilateral.   Vascular: Dorsalis Pedis and Posterior Tibial pulses palpable bilateral.  Capillary fill time is immediate to all digits.  Neurological: Epicritic and protective threshold intact bilateral.   Musculoskeletal: Tenderness to palpation at the medial calcaneal tubercale and through the insertion of the plantar fascia of the right foot. All other joints range of motion within normal limits bilateral. Strength 5/5 in all groups bilateral.   Radiographic exam: Normal osseous mineralization. Joint spaces preserved. No fracture/dislocation/boney destruction. Calcaneal spur present with mild thickening of plantar fascia right. No other soft tissue abnormalities or radiopaque foreign bodies.   Assessment: 1. Plantar fasciitis right 2. Pain in right foot  Plan of Care:  1. Patient evaluated. Xrays reviewed.   2. Injection of 0.5cc Celestone soluspan injected into the right plantar fascia  3. Rx for Mobic 15mg  ordered for patient. 4. Plantar fascial band(s) dispensed 5. Instructed patient regarding therapies and modalities at home to alleviate symptoms.  6. Night splint dispensed.  7. Return to clinic in 4 weeks.    Going on vacation to  [place].   Edrick Kins, DPM [facility]  Dr. Edrick Kins, DPM    [address], [place] [address]                Office [phone]  Fax [phone]

## 2017-06-04 ENCOUNTER — Ambulatory Visit: Payer: Medicare HMO | Admitting: Podiatry

## 2017-06-11 ENCOUNTER — Other Ambulatory Visit: Payer: Self-pay | Admitting: Family Medicine

## 2017-06-11 ENCOUNTER — Other Ambulatory Visit (HOSPITAL_COMMUNITY)
Admission: RE | Admit: 2017-06-11 | Discharge: 2017-06-11 | Disposition: A | Discharge status: Home / Self Care | Payer: Medicare HMO | Source: Ambulatory Visit | Attending: Family Medicine | Admitting: Family Medicine

## 2017-06-11 DIAGNOSIS — Z124 Encounter for screening for malignant neoplasm of cervix: Secondary | ICD-10-CM | POA: Diagnosis not present

## 2017-06-11 DIAGNOSIS — Z0000 Encounter for general adult medical examination without abnormal findings: Secondary | ICD-10-CM | POA: Insufficient documentation

## 2017-06-11 DIAGNOSIS — Z1389 Encounter for screening for other disorder: Secondary | ICD-10-CM | POA: Diagnosis not present

## 2017-06-11 DIAGNOSIS — M549 Dorsalgia, unspecified: Secondary | ICD-10-CM | POA: Diagnosis not present

## 2017-06-11 DIAGNOSIS — E785 Hyperlipidemia, unspecified: Secondary | ICD-10-CM | POA: Diagnosis not present

## 2017-06-11 DIAGNOSIS — K635 Polyp of colon: Secondary | ICD-10-CM | POA: Diagnosis not present

## 2017-06-12 ENCOUNTER — Encounter: Payer: Self-pay | Admitting: Family Medicine

## 2017-06-12 LAB — CYTOLOGY - PAP
Diagnosis: NEGATIVE
HPV: NOT DETECTED

## 2017-06-14 DIAGNOSIS — R69 Illness, unspecified: Secondary | ICD-10-CM | POA: Diagnosis not present

## 2017-06-18 DIAGNOSIS — R69 Illness, unspecified: Secondary | ICD-10-CM | POA: Diagnosis not present

## 2017-06-28 ENCOUNTER — Encounter: Payer: Self-pay | Admitting: Pulmonary Disease

## 2017-06-28 ENCOUNTER — Ambulatory Visit: Payer: Medicare HMO | Admitting: Pulmonary Disease

## 2017-06-28 DIAGNOSIS — G4733 Obstructive sleep apnea (adult) (pediatric): Secondary | ICD-10-CM

## 2017-06-28 NOTE — Addendum Note (Signed)
Addended by: Amado Coe on: 06/28/2017 09:43 AM   Modules accepted: Orders

## 2017-06-28 NOTE — Progress Notes (Signed)
   Subjective:    Patient ID: Analilia Geddis Heins, female    DOB: 03-31-1950, 67 y.o.   MRN: [id number]  HPI 67 year old woman for follow-up of obstructive sleep apnea. She was diagnosed in 2012.  Initially treated with dental appliance but then switched to CPAP.  She has seen Dr. Ron Parker and felt that she could not be provided with another dental device. She has had good results with a CPAP with good improvement in her daytime somnolence and fatigue. Download was reviewed on 12 cm which shows excellent control of events on 12 cm with minimal leak. Compliance is about 7 hours per night.  She would like a travel CPAP but is planning to go on a cruise.  Weight has increased slightly   Significant tests/ events reviewed  NPSG 01/2011: AHI 25/hr     Review of Systems Patient denies significant dyspnea,cough, hemoptysis,  chest pain, palpitations, pedal edema, orthopnea, paroxysmal nocturnal dyspnea, lightheadedness, nausea, vomiting, abdominal or  leg pains      Objective:   Physical Exam  Gen. Pleasant, well-nourished, in no distress ENT - no thrush, no post nasal drip Neck: No JVD, no thyromegaly, no carotid bruits Lungs: no use of accessory muscles, no dullness to percussion, clear without rales or rhonchi  Cardiovascular: Rhythm regular, heart sounds  normal, no murmurs or gallops, no peripheral edema Musculoskeletal: No deformities, no cyanosis or clubbing        Assessment & Plan:

## 2017-06-28 NOTE — Patient Instructions (Signed)
Prescription for new CPAP 12 cm will be sent to DME. Prescription for travel CPAP 12 cm will be provided to you. CPAP supplies will be renewed for a year

## 2017-06-28 NOTE — Assessment & Plan Note (Signed)
Prescription for new CPAP 12 cm will be sent to DME. Prescription for travel CPAP 12 cm will be provided to you. CPAP supplies will be renewed for a year  Weight loss encouraged, compliance with goal of at least 4-6 hrs every night is the expectation. Advised against medications with sedative side effects Cautioned against driving when sleepy - understanding that sleepiness will vary on a day to day basis

## 2017-07-10 ENCOUNTER — Telehealth: Payer: Self-pay | Admitting: Pulmonary Disease

## 2017-07-10 NOTE — Telephone Encounter (Signed)
[facility] told her they need sleep study, that she is compliant, OV note, and settings for CPAP.  Fax [phone].  Patient states she is going on cruise first part of June and needs this before then.

## 2017-07-10 NOTE — Telephone Encounter (Addendum)
Pt called in & states order should have gone to [facility].  Order states to send to [place].  Message sent to Amanda/Gilda at [facility] to let them know to disregard.  Message sent to Melissa/Jason at [facility] to make them aware of order.  Spoke to pt & made her aware ordering now being sent to [facility].  Nothing further needed at this time.

## 2017-07-19 DIAGNOSIS — G4733 Obstructive sleep apnea (adult) (pediatric): Secondary | ICD-10-CM | POA: Diagnosis not present

## 2017-07-20 DIAGNOSIS — G4733 Obstructive sleep apnea (adult) (pediatric): Secondary | ICD-10-CM | POA: Diagnosis not present

## 2017-07-23 ENCOUNTER — Encounter: Payer: Self-pay | Admitting: Podiatry

## 2017-07-23 ENCOUNTER — Ambulatory Visit: Payer: Medicare HMO | Admitting: Podiatry

## 2017-07-23 DIAGNOSIS — M722 Plantar fascial fibromatosis: Secondary | ICD-10-CM

## 2017-07-23 MED ORDER — MELOXICAM 15 MG PO TABS: 15.0000 mg | ORAL_TABLET | Freq: Every day | ORAL | 1 refills | Status: AC

## 2017-07-23 MED ORDER — MELOXICAM 15 MG PO TABS: 15.0000 mg | ORAL_TABLET | Freq: Every day | ORAL | 1 refills | Status: DC

## 2017-07-25 NOTE — Progress Notes (Signed)
   Subjective: 67 year old female presenting today for follow up evaluation of plantar fasciitis of the right foot. She reports pain and swelling that began one month ago. Walking for long periods of time worsens the pain. She states the injection and wearing the brace has helped alleviate the pain in the past. Patient is here for further evaluation and treatment.   Past Medical History:  Diagnosis Date  . Adenomatous polyp   . Allergic rhinitis   . Depression   . Hyperlipidemia   . Sleep apnea    wears mouth guard     Objective: Physical Exam General: The patient is alert and oriented x3 in no acute distress.  Dermatology: Skin is warm, dry and supple bilateral lower extremities. Negative for open lesions or macerations bilateral.   Vascular: Dorsalis Pedis and Posterior Tibial pulses palpable bilateral.  Capillary fill time is immediate to all digits.  Neurological: Epicritic and protective threshold intact bilateral.   Musculoskeletal: Tenderness to palpation at the medial calcaneal tubercale and through the insertion of the plantar fascia of the right foot. All other joints range of motion within normal limits bilateral. Strength 5/5 in all groups bilateral.   Assessment: 1. Plantar fasciitis right 2. Pain in right foot  Plan of Care:  1. Patient evaluated.    2. Injection of 0.5cc Celestone soluspan injected into the right plantar fascia  3. Continue wearing plantar fascial brace and night splint.  4. Refill prescription for Meloxicam provided to patient.  5. Return to clinic in 4 weeks.      Edrick Kins, DPM [facility]  Dr. Edrick Kins, DPM    [address], [place] [address]                Office [phone]  Fax [phone]

## 2017-07-26 ENCOUNTER — Telehealth: Payer: Self-pay | Admitting: Pulmonary Disease

## 2017-07-26 DIAGNOSIS — G4733 Obstructive sleep apnea (adult) (pediatric): Secondary | ICD-10-CM

## 2017-07-26 NOTE — Telephone Encounter (Signed)
Patient called back - advised that order to change cpap setting has been placed-pt needs nothing further -pr

## 2017-07-26 NOTE — Telephone Encounter (Signed)
Okay to change to auto settings 5 to 12 cm. Check download and ensure office visit in 1 month

## 2017-07-26 NOTE — Telephone Encounter (Signed)
Called and spoke to patient. Patient stated that she received a new machine and mask about two weeks ago and since having the new equipment she feels like the pressure is too high. Patient stated it feels like she is getting too much air. Patient reports that she talked to [place] and they recommended she change to auto settings.  RA please advise.

## 2017-07-26 NOTE — Telephone Encounter (Signed)
ATC pt, no answer. Left message for pt to call back.  Order placed for CPAP settings to be changed.

## 2017-07-27 ENCOUNTER — Telehealth: Payer: Self-pay | Admitting: Pulmonary Disease

## 2017-07-27 DIAGNOSIS — G4733 Obstructive sleep apnea (adult) (pediatric): Secondary | ICD-10-CM

## 2017-07-27 NOTE — Telephone Encounter (Signed)
[place] from [facility]. Unable to reach left message to give [place] a call back.

## 2017-07-30 NOTE — Telephone Encounter (Signed)
Spoke with Barbaraann Rondo at [facility], states that pt's cpap machine does not support autotitrating pressures.  Pt will need a new order for a loaner cpap X2 weeks with auto settings, and then an order for a set pressure setting.  RA please advise- ok to order?  Still have settings set to 5-12cm?  Thanks!

## 2017-07-30 NOTE — Telephone Encounter (Signed)
Neck case, just drop CPAP pressure to 10 cm and check download in 2 weeks

## 2017-07-30 NOTE — Telephone Encounter (Signed)
Order placed. Nothing further needed. 

## 2017-08-13 ENCOUNTER — Encounter: Payer: Self-pay | Admitting: Podiatry

## 2017-08-13 ENCOUNTER — Ambulatory Visit: Payer: Medicare HMO | Admitting: Podiatry

## 2017-08-13 DIAGNOSIS — M722 Plantar fascial fibromatosis: Secondary | ICD-10-CM

## 2017-08-14 ENCOUNTER — Telehealth: Payer: Self-pay | Admitting: Podiatry

## 2017-08-14 NOTE — Telephone Encounter (Signed)
Patient called to see if the Prednisone is going to be sent in anytime soon? She called her pharmacy and they've stated they haven't received anything from [place]. [facility] [address]. If you can call patient back at [phone]

## 2017-08-15 ENCOUNTER — Telehealth: Payer: Self-pay | Admitting: Podiatry

## 2017-08-15 MED ORDER — PREDNISONE 10 MG PO TABS: ORAL_TABLET | ORAL | 0 refills | Status: DC

## 2017-08-15 NOTE — Telephone Encounter (Signed)
I saw Dr. Amalia Hailey on Monday and he told me he was going to call in a prescription for a 6 day prednisone Dosepak. I called yesterday and spoke to a nurse who said she was going to [facility]. It has not been called in. I'm leaving town tomorrow at 6 am and will be gone for a month. Dr. Amalia Hailey said that's why he wanted to give me the prednisone Dosepak. Can I have someone call me and let me know if there is a problem with this? I am at work and my number is [phone]. I work for [employer]. If you could call me and let me know what the problem is with calling this in. Did Dr. Amalia Hailey change his mind? I would appreciate a call back today. Thank you.

## 2017-08-15 NOTE — Addendum Note (Signed)
Addended by: Harriett Sine D on: 08/15/2017 04:40 PM   Modules accepted: Orders

## 2017-08-15 NOTE — Telephone Encounter (Signed)
Dr. Amalia Hailey states order prednisone 10mg  #21 6 day tapering dose pack. Orders escrib[id number] and called to pt.

## 2017-08-16 NOTE — Progress Notes (Signed)
   Subjective: 67 year old female presenting today for follow up evaluation of plantar fasciitis of the right foot. She reports continued pain. She states the injection did not provide much relief but she has been taking the Meloxicam which seems to help. There are no other modifying factors noted. Patient is here for further evaluation and treatment.   Past Medical History:  Diagnosis Date  . Adenomatous polyp   . Allergic rhinitis   . Depression   . Hyperlipidemia   . Sleep apnea    wears mouth guard     Objective: Physical Exam General: The patient is alert and oriented x3 in no acute distress.  Dermatology: Skin is warm, dry and supple bilateral lower extremities. Negative for open lesions or macerations bilateral.   Vascular: Dorsalis Pedis and Posterior Tibial pulses palpable bilateral.  Capillary fill time is immediate to all digits.  Neurological: Epicritic and protective threshold intact bilateral.   Musculoskeletal: Tenderness to palpation at the medial calcaneal tubercale and through the insertion of the plantar fascia of the right foot. All other joints range of motion within normal limits bilateral. Strength 5/5 in all groups bilateral.   Assessment: 1. Plantar fasciitis right 2. Pain in right foot  Plan of Care:  1. Patient evaluated.    2. Injection of 0.5cc Celestone soluspan injected into the right plantar fascia  3. Prescription for Medrol Dose Pak provided to patient. Then resume taking Mobic daily.  4. Return to clinic in 4 weeks.   Leaves for vacation on [place] cruise.       Edrick Kins, DPM [facility]  Dr. Edrick Kins, DPM    [address], [place] [address]                Office [phone]  Fax [phone]

## 2017-08-17 NOTE — Progress Notes (Signed)
Please remove my name as Primary Care provider. She has changed to another physician

## 2017-08-19 DIAGNOSIS — G4733 Obstructive sleep apnea (adult) (pediatric): Secondary | ICD-10-CM | POA: Diagnosis not present

## 2017-08-20 ENCOUNTER — Ambulatory Visit: Payer: Medicare HMO | Admitting: Podiatry

## 2017-09-12 DIAGNOSIS — H9201 Otalgia, right ear: Secondary | ICD-10-CM | POA: Diagnosis not present

## 2017-09-12 DIAGNOSIS — J029 Acute pharyngitis, unspecified: Secondary | ICD-10-CM | POA: Diagnosis not present

## 2017-09-17 ENCOUNTER — Ambulatory Visit: Payer: Medicare HMO | Admitting: Podiatry

## 2017-09-18 DIAGNOSIS — G4733 Obstructive sleep apnea (adult) (pediatric): Secondary | ICD-10-CM | POA: Diagnosis not present

## 2017-10-19 DIAGNOSIS — G4733 Obstructive sleep apnea (adult) (pediatric): Secondary | ICD-10-CM | POA: Diagnosis not present

## 2017-11-01 DIAGNOSIS — G4733 Obstructive sleep apnea (adult) (pediatric): Secondary | ICD-10-CM | POA: Diagnosis not present

## 2017-11-12 DIAGNOSIS — N390 Urinary tract infection, site not specified: Secondary | ICD-10-CM | POA: Diagnosis not present

## 2017-11-19 DIAGNOSIS — G4733 Obstructive sleep apnea (adult) (pediatric): Secondary | ICD-10-CM | POA: Diagnosis not present

## 2017-11-23 DIAGNOSIS — K635 Polyp of colon: Secondary | ICD-10-CM | POA: Diagnosis not present

## 2017-11-23 DIAGNOSIS — Z86010 Personal history of colonic polyps: Secondary | ICD-10-CM | POA: Diagnosis not present

## 2017-11-23 DIAGNOSIS — K640 First degree hemorrhoids: Secondary | ICD-10-CM | POA: Diagnosis not present

## 2017-11-23 DIAGNOSIS — D123 Benign neoplasm of transverse colon: Secondary | ICD-10-CM | POA: Diagnosis not present

## 2017-11-27 DIAGNOSIS — D123 Benign neoplasm of transverse colon: Secondary | ICD-10-CM | POA: Diagnosis not present

## 2017-11-27 DIAGNOSIS — K635 Polyp of colon: Secondary | ICD-10-CM | POA: Diagnosis not present

## 2017-12-14 DIAGNOSIS — Z1389 Encounter for screening for other disorder: Secondary | ICD-10-CM | POA: Diagnosis not present

## 2017-12-14 DIAGNOSIS — E785 Hyperlipidemia, unspecified: Secondary | ICD-10-CM | POA: Diagnosis not present

## 2017-12-14 DIAGNOSIS — M8588 Other specified disorders of bone density and structure, other site: Secondary | ICD-10-CM | POA: Diagnosis not present

## 2017-12-14 DIAGNOSIS — R7303 Prediabetes: Secondary | ICD-10-CM | POA: Diagnosis not present

## 2017-12-19 DIAGNOSIS — G4733 Obstructive sleep apnea (adult) (pediatric): Secondary | ICD-10-CM | POA: Diagnosis not present

## 2018-01-16 ENCOUNTER — Other Ambulatory Visit: Payer: Self-pay | Admitting: Family Medicine

## 2018-01-16 DIAGNOSIS — M8580 Other specified disorders of bone density and structure, unspecified site: Secondary | ICD-10-CM

## 2018-01-16 DIAGNOSIS — Z1231 Encounter for screening mammogram for malignant neoplasm of breast: Secondary | ICD-10-CM

## 2018-01-16 DIAGNOSIS — E2839 Other primary ovarian failure: Secondary | ICD-10-CM

## 2018-01-19 DIAGNOSIS — G4733 Obstructive sleep apnea (adult) (pediatric): Secondary | ICD-10-CM | POA: Diagnosis not present

## 2018-02-09 DIAGNOSIS — G4733 Obstructive sleep apnea (adult) (pediatric): Secondary | ICD-10-CM | POA: Diagnosis not present

## 2018-02-18 DIAGNOSIS — G4733 Obstructive sleep apnea (adult) (pediatric): Secondary | ICD-10-CM | POA: Diagnosis not present

## 2018-02-22 ENCOUNTER — Other Ambulatory Visit: Payer: Self-pay | Admitting: Internal Medicine

## 2018-02-27 ENCOUNTER — Ambulatory Visit
Admission: RE | Admit: 2018-02-27 | Discharge: 2018-02-27 | Disposition: A | Discharge status: Home / Self Care | Payer: Medicare HMO | Source: Ambulatory Visit | Attending: Family Medicine | Admitting: Family Medicine

## 2018-02-27 DIAGNOSIS — Z1231 Encounter for screening mammogram for malignant neoplasm of breast: Secondary | ICD-10-CM

## 2018-02-27 DIAGNOSIS — M8580 Other specified disorders of bone density and structure, unspecified site: Secondary | ICD-10-CM

## 2018-02-27 DIAGNOSIS — Z780 Asymptomatic menopausal state: Secondary | ICD-10-CM | POA: Diagnosis not present

## 2018-02-27 DIAGNOSIS — M85851 Other specified disorders of bone density and structure, right thigh: Secondary | ICD-10-CM | POA: Diagnosis not present

## 2018-02-27 DIAGNOSIS — E2839 Other primary ovarian failure: Secondary | ICD-10-CM

## 2018-02-28 DIAGNOSIS — H01001 Unspecified blepharitis right upper eyelid: Secondary | ICD-10-CM | POA: Diagnosis not present

## 2018-02-28 DIAGNOSIS — H2513 Age-related nuclear cataract, bilateral: Secondary | ICD-10-CM | POA: Diagnosis not present

## 2018-02-28 DIAGNOSIS — H04123 Dry eye syndrome of bilateral lacrimal glands: Secondary | ICD-10-CM | POA: Diagnosis not present

## 2018-02-28 DIAGNOSIS — H01002 Unspecified blepharitis right lower eyelid: Secondary | ICD-10-CM | POA: Diagnosis not present

## 2018-03-21 DIAGNOSIS — G4733 Obstructive sleep apnea (adult) (pediatric): Secondary | ICD-10-CM | POA: Diagnosis not present

## 2018-03-25 ENCOUNTER — Telehealth: Payer: Self-pay | Admitting: Pulmonary Disease

## 2018-03-25 DIAGNOSIS — G4733 Obstructive sleep apnea (adult) (pediatric): Secondary | ICD-10-CM

## 2018-03-25 NOTE — Telephone Encounter (Signed)
Called and spoke to pt.  Pt is requesting order for new travel cpap, as her travel cpap got lost in [place].  Pt would like written Rx, as she will be paying out of pocket.  Pt states she plans to travel in late feb.   RA please advise if okay to order. Thanks

## 2018-03-25 NOTE — Telephone Encounter (Signed)
Okay to send prescription for travel CPAP 12 cm

## 2018-03-25 NOTE — Telephone Encounter (Signed)
Left message for patient to call back. Need to see if she is wanting [place] to mail this to her.

## 2018-03-26 NOTE — Telephone Encounter (Signed)
Cherina the CPAP order is pended, can you get Dr. Elsworth Soho to sign it and mail it to pt. Thanks.

## 2018-03-26 NOTE — Telephone Encounter (Signed)
Pt is calling back  pt want the script mailed to her.  [phone]

## 2018-03-26 NOTE — Telephone Encounter (Signed)
ATC pt, no answer. Left message for pt to call back.  

## 2018-03-27 NOTE — Telephone Encounter (Signed)
RX has been printed and placed in RA's folder for his signature.

## 2018-04-01 NOTE — Telephone Encounter (Signed)
RX was placed in the mail last week.

## 2018-04-01 NOTE — Telephone Encounter (Signed)
Cherina please advise if this has been taken care of. Thank you!

## 2018-04-21 DIAGNOSIS — G4733 Obstructive sleep apnea (adult) (pediatric): Secondary | ICD-10-CM | POA: Diagnosis not present

## 2018-05-20 DIAGNOSIS — G4733 Obstructive sleep apnea (adult) (pediatric): Secondary | ICD-10-CM | POA: Diagnosis not present

## 2018-06-04 DIAGNOSIS — G4733 Obstructive sleep apnea (adult) (pediatric): Secondary | ICD-10-CM | POA: Diagnosis not present

## 2018-06-20 ENCOUNTER — Other Ambulatory Visit: Payer: Self-pay

## 2018-06-20 ENCOUNTER — Other Ambulatory Visit: Payer: Medicare HMO | Admitting: Internal Medicine

## 2018-06-20 ENCOUNTER — Ambulatory Visit (INDEPENDENT_AMBULATORY_CARE_PROVIDER_SITE_OTHER): Payer: Medicare HMO | Admitting: Internal Medicine

## 2018-06-20 VITALS — Temp 98.2°F

## 2018-06-20 DIAGNOSIS — M722 Plantar fascial fibromatosis: Secondary | ICD-10-CM

## 2018-06-20 DIAGNOSIS — R7302 Impaired glucose tolerance (oral): Secondary | ICD-10-CM

## 2018-06-20 DIAGNOSIS — Z0000 Encounter for general adult medical examination without abnormal findings: Secondary | ICD-10-CM | POA: Diagnosis not present

## 2018-06-20 DIAGNOSIS — G4733 Obstructive sleep apnea (adult) (pediatric): Secondary | ICD-10-CM | POA: Diagnosis not present

## 2018-06-20 DIAGNOSIS — E782 Mixed hyperlipidemia: Secondary | ICD-10-CM

## 2018-06-21 LAB — CBC WITH DIFFERENTIAL/PLATELET
Absolute Monocytes: 428 cells/uL (ref 200–950)
Basophils Absolute: 11 cells/uL (ref 0–200)
Basophils Relative: 0.3 %
Eosinophils Absolute: 119 cells/uL (ref 15–500)
Eosinophils Relative: 3.3 %
HCT: 42.9 % (ref 35.0–45.0)
Hemoglobin: 14.9 g/dL (ref 11.7–15.5)
Lymphs Abs: 1166 cells/uL (ref 850–3900)
MCH: 29.5 pg (ref 27.0–33.0)
MCHC: 34.7 g/dL (ref 32.0–36.0)
MCV: 85 fL (ref 80.0–100.0)
MPV: 11.3 fL (ref 7.5–12.5)
Monocytes Relative: 11.9 %
Neutro Abs: 1876 cells/uL (ref 1500–7800)
Neutrophils Relative %: 52.1 %
Platelets: 219 10*3/uL (ref 140–400)
RBC: 5.05 10*6/uL (ref 3.80–5.10)
RDW: 13.6 % (ref 11.0–15.0)
Total Lymphocyte: 32.4 %
WBC: 3.6 10*3/uL — ABNORMAL LOW (ref 3.8–10.8)

## 2018-06-21 LAB — COMPLETE METABOLIC PANEL WITH GFR
AG Ratio: 1.8 (calc) (ref 1.0–2.5)
ALT: 31 U/L — ABNORMAL HIGH (ref 6–29)
AST: 28 U/L (ref 10–35)
Albumin: 4.4 g/dL (ref 3.6–5.1)
Alkaline phosphatase (APISO): 90 U/L (ref 37–153)
BUN/Creatinine Ratio: 16 (calc) (ref 6–22)
BUN: 17 mg/dL (ref 7–25)
CO2: 28 mmol/L (ref 20–32)
Calcium: 10.2 mg/dL (ref 8.6–10.4)
Chloride: 104 mmol/L (ref 98–110)
Creat: 1.06 mg/dL — ABNORMAL HIGH (ref 0.50–0.99)
GFR, Est African American: 62 mL/min/{1.73_m2} (ref >=60)
GFR, Est Non African American: 54 mL/min/{1.73_m2} — ABNORMAL LOW (ref >=60)
Globulin: 2.5 g/dL (calc) (ref 1.9–3.7)
Glucose, Bld: 113 mg/dL — ABNORMAL HIGH (ref 65–99)
Potassium: 4.1 mmol/L (ref 3.5–5.3)
Sodium: 139 mmol/L (ref 135–146)
Total Bilirubin: 0.5 mg/dL (ref 0.2–1.2)
Total Protein: 6.9 g/dL (ref 6.1–8.1)

## 2018-06-21 LAB — LIPID PANEL
Cholesterol: 200 mg/dL — ABNORMAL HIGH (ref <200)
HDL: 50 mg/dL (ref >=50)
LDL Cholesterol (Calc): 113 mg/dL (calc) — ABNORMAL HIGH
Non-HDL Cholesterol (Calc): 150 mg/dL (calc) — ABNORMAL HIGH (ref <130)
Total CHOL/HDL Ratio: 4 (calc) (ref <5.0)
Triglycerides: 243 mg/dL — ABNORMAL HIGH (ref <150)

## 2018-06-21 LAB — TSH: TSH: 3.5 mIU/L (ref 0.40–4.50)

## 2018-06-21 LAB — HEMOGLOBIN A1C
Hgb A1c MFr Bld: 6.5 % of total Hgb — ABNORMAL HIGH (ref <5.7)
Mean Plasma Glucose: 140 (calc)
eAG (mmol/L): 7.7 (calc)

## 2018-06-21 LAB — MICROALBUMIN / CREATININE URINE RATIO
Creatinine, Urine: 41 mg/dL (ref 20–275)
Microalb Creat Ratio: 7 mcg/mg creat (ref <30)
Microalb, Ur: 0.3 mg/dL

## 2018-06-25 ENCOUNTER — Encounter: Payer: Self-pay | Admitting: Internal Medicine

## 2018-06-25 ENCOUNTER — Ambulatory Visit (INDEPENDENT_AMBULATORY_CARE_PROVIDER_SITE_OTHER): Payer: Medicare HMO | Admitting: Internal Medicine

## 2018-06-25 ENCOUNTER — Other Ambulatory Visit: Payer: Self-pay

## 2018-06-25 VITALS — BP 120/70 | HR 66 | Temp 98.4°F | Ht 63.75 in | Wt 166.0 lb

## 2018-06-25 DIAGNOSIS — M79672 Pain in left foot: Secondary | ICD-10-CM

## 2018-06-25 DIAGNOSIS — G4733 Obstructive sleep apnea (adult) (pediatric): Secondary | ICD-10-CM | POA: Diagnosis not present

## 2018-06-25 DIAGNOSIS — M722 Plantar fascial fibromatosis: Secondary | ICD-10-CM | POA: Diagnosis not present

## 2018-06-25 DIAGNOSIS — R7302 Impaired glucose tolerance (oral): Secondary | ICD-10-CM

## 2018-06-25 DIAGNOSIS — E782 Mixed hyperlipidemia: Secondary | ICD-10-CM

## 2018-06-25 DIAGNOSIS — Z23 Encounter for immunization: Secondary | ICD-10-CM | POA: Diagnosis not present

## 2018-06-25 DIAGNOSIS — Z0000 Encounter for general adult medical examination without abnormal findings: Secondary | ICD-10-CM | POA: Diagnosis not present

## 2018-06-25 DIAGNOSIS — Z8709 Personal history of other diseases of the respiratory system: Secondary | ICD-10-CM | POA: Diagnosis not present

## 2018-06-25 DIAGNOSIS — Z86010 Personal history of colonic polyps: Secondary | ICD-10-CM

## 2018-06-25 DIAGNOSIS — Z860101 Personal history of adenomatous and serrated colon polyps: Secondary | ICD-10-CM

## 2018-06-25 MED ORDER — METHYLPREDNISOLONE ACETATE 80 MG/ML IJ SUSP
80.0000 mg | Freq: Once | INTRAMUSCULAR | Status: AC
  Administered 2018-06-25: 80 mg via INTRAMUSCULAR

## 2018-06-25 NOTE — Progress Notes (Signed)
Subjective:    Patient ID: Dawn Herrera, female    DOB: 11-Jan-1951, 68 y.o.   MRN: [id number]  HPI 68 year old Female in today for routine health maintenance, Medicare wellness, and evaluation of medical issues.  Patient had transferred to another physician near her home a while back but prefers coming here.  She was last seen here in July 2018 until today.  She is seen in person in the office today.  She has a history of sleep apnea and is followed by Dr. Elsworth Soho in Pulmonology.  Has been having issues with left plantar fasciitis and is seeing Dr. Amalia Hailey, podiatrist at [facility].  She has had injections left lateral foot without relief.  We injected this once again today with Depo-Medrol Marcaine and Xylocaine.  Longstanding history of hyperlipidemia and impaired glucose tolerance.  Social history: She is retired.  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  She is married.  Husband is retired.  2 adult daughters.  She smoked 1/2 to 1 pack of cigarettes daily for over 30 years.  Social alcohol consumption.  Husband is retired.  In June 2015 she had an episode of epigastric pain.  She was diagnosed with H. pylori gastritis and was treated with 3 drug regimen.  Had endoscopy showing duodenitis and gastritis along with hiatal hernia.  Had issues with benign positional vertigo October 2015 that responded to Epley maneuver.  History of vitamin D deficiency and adenomatous colon polyp.  She had colonoscopy in 2007 that showed one small adenomatous polyp in the descending colon.  Did have repeat colonoscopy January 2012 showing hyperplastic polyps.  Bone density study done January 2014 with history of osteopenia.  Saw Dr. Lawerance Bach for microscopic hematuria in 2007 with normal cystoscopy and ureteroscopy.  Saw Dr. Daylene Katayama in 2009 for impingement right shoulder with mild adhesive capsulitis.  No known drug allergies  Family history: Father died at 66 with congestive heart  failure with history of diabetes.  Mother with history of coronary artery disease, MI, status post CABG.  One brother and 4 sisters in good health.   Review of lab studies shows fasting glucose of 113 with hemoglobin A1c 6.5%.  Total cholesterol was 200, triglycerides 243 and LDL cholesterol 113.  TSH is within normal limits.  Patient does not want to be on medication for impaired glucose tolerance.  We have agreed we will reevaluate in 6 months after trial of diet and exercise.         Review of Systems  Constitutional: Negative.   Musculoskeletal:       Plantar fasciitis  All other systems reviewed and are negative.      Objective:   Physical Exam Vitals signs reviewed.  Constitutional:      General: She is not in acute distress.    Appearance: Normal appearance. She is not ill-appearing or diaphoretic.  HENT:     Head: Normocephalic and atraumatic.     Right Ear: Tympanic membrane normal.     Left Ear: Tympanic membrane normal.     Nose: Nose normal.     Mouth/Throat:     Mouth: Mucous membranes are moist.     Pharynx: Oropharynx is clear.  Eyes:     General: No scleral icterus.       Right eye: No discharge.        Left eye: No discharge.     Conjunctiva/sclera: Conjunctivae normal.     Pupils: Pupils are equal, round,  and reactive to light.  Neck:     Musculoskeletal: Neck supple. No neck rigidity.     Comments: No thyromegaly Cardiovascular:     Heart sounds: No murmur.  Pulmonary:     Effort: No respiratory distress.     Breath sounds: Normal breath sounds. No wheezing or rales.     Comments: Breast without masses Abdominal:     General: Bowel sounds are normal. There is no distension.     Palpations: Abdomen is soft. There is no mass.     Tenderness: There is no guarding or rebound.  Genitourinary:    Comments: Bimanual normal.  Pap deferred due to age Musculoskeletal:     Right lower leg: No edema.     Left lower leg: No edema.     Comments: Point  tenderness  Skin:    General: Skin is warm and dry.  Neurological:     General: No focal deficit present.     Mental Status: She is alert and oriented to person, place, and time.     Cranial Nerves: No cranial nerve deficit.     Coordination: Coordination normal.     Gait: Gait normal.  Psychiatric:        Mood and Affect: Mood normal.        Behavior: Behavior normal.        Thought Content: Thought content normal.        Judgment: Judgment normal.           Assessment & Plan:  Hyperlipidemia- patient reminded to take Crestor 10 mg daily on a regular basis.  Follow-up in 6 months.  Work on diet and exercise.  Impaired glucose tolerance-hemoglobin A1c 6.5%.  Does not want to be on medication.  Reevaluate in 6 months.  Left plantar fasciitis-foot injected with Depo-Medrol Xylocaine and Marcaine today  Remote history of smoking with occasional exacerbations of COPD . History of sleep apnea treated with CPAP  Plan: Follow-up in 6 months.  Work on diet and exercise.  Subjective:   Patient presents for Medicare Annual/Subsequent preventive examination.  Review Past Medical/Family/Social: See above   Risk Factors  Current exercise habits: Light exercise Dietary issues discussed: Low-fat low carbohydrate  Cardiac risk factors: Family history, glucose intolerance and hyperlipidemia, remote history of smoking  Depression Screen  (Note: if answer to either of the following is "Yes", a more complete depression screening is indicated)   Over the past two weeks, have you felt down, depressed or hopeless? No  Over the past two weeks, have you felt little interest or pleasure in doing things? No Have you lost interest or pleasure in daily life? No Do you often feel hopeless? No Do you cry easily over simple problems? No   Activities of Daily Living  In your present state of health, do you have any difficulty performing the following activities?:   Driving? No  Managing  money? No  Feeding yourself? No  Getting from bed to chair? No  Climbing a flight of stairs? No  Preparing food and eating?: No  Bathing or showering? No  Getting dressed: No  Getting to the toilet? No  Using the toilet:No  Moving around from place to place: No  In the past year have you fallen or had a near fall?:No  Are you sexually active? No  Do you have more than one partner? No   Hearing Difficulties: No  Do you often ask people to speak up or repeat themselves?  Yes  Do you experience ringing or noises in your ears?  Yes do you have difficulty understanding soft or whispered voices?  Yes Do you feel that you have a problem with memory?  Very occasionally Do you often misplace items? No    Home Safety:  Do you have a smoke alarm at your residence? Yes Do you have grab bars in the bathroom?  Yes Do you have throw rugs in your house?  Yes   Cognitive Testing  Alert? Yes Normal Appearance?Yes  Oriented to person? Yes Place? Yes  Time? Yes  Recall of three objects? Yes  Can perform simple calculations? Yes  Displays appropriate judgment?Yes  Can read the correct time from a watch face?Yes   List the Names of Other Physician/Practitioners you currently use:  See referral list for the physicians patient is currently seeing.     Review of Systems: See above   Objective:     General appearance: Appears stated age and mildly obese  Head: Normocephalic, without obvious abnormality, atraumatic  Eyes: conj clear, EOMi PEERLA  Ears: normal TM's and external ear canals both ears  Nose: Nares normal. Septum midline. Mucosa normal. No drainage or sinus tenderness.  Throat: lips, mucosa, and tongue normal; teeth and gums normal  Neck: no adenopathy, no carotid bruit, no JVD, supple, symmetrical, trachea midline and thyroid not enlarged, symmetric, no tenderness/mass/nodules  No CVA tenderness.  Lungs: clear to auscultation bilaterally  Breasts: normal appearance, no masses  or tenderness Heart: regular rate and rhythm, S1, S2 normal, no murmur, click, rub or gallop  Abdomen: soft, non-tender; bowel sounds normal; no masses, no organomegaly  Musculoskeletal: ROM normal in all joints, no crepitus, no deformity, Normal muscle strengthen. Back  is symmetric, no curvature. Skin: Skin color, texture, turgor normal. No rashes or lesions  Lymph nodes: Cervical, supraclavicular, and axillary nodes normal.  Neurologic: CN 2 -12 Normal, Normal symmetric reflexes. Normal coordination and gait  Psych: Alert & Oriented x 3, Mood appear stable.    Assessment:    Annual wellness medicare exam   Plan:    During the course of the visit the patient was educated and counseled about appropriate screening and preventive services including:   Pneumococcal vaccine needed     Patient Instructions (the written plan) was given to the patient.  Medicare Attestation  I have personally reviewed:  The patient's medical and social history  Their use of alcohol, tobacco or illicit drugs  Their current medications and supplements  The patient's functional ability including ADLs,fall risks, home safety risks, cognitive, and hearing and visual impairment  Diet and physical activities  Evidence for depression or mood disorders  The patient's weight, height, BMI, and visual acuity have been recorded in the chart. I have made referrals, counseling, and provided education to the patient based on review of the above and I have provided the patient with a written personalized care plan for preventive services.

## 2018-07-01 ENCOUNTER — Ambulatory Visit: Payer: Medicare HMO | Admitting: Adult Health

## 2018-07-02 ENCOUNTER — Other Ambulatory Visit: Payer: Self-pay

## 2018-07-02 MED ORDER — TRIAMCINOLONE ACETONIDE 0.1 % EX CREA: 1.0000 "application " | TOPICAL_CREAM | Freq: Three times a day (TID) | CUTANEOUS | 1 refills | Status: DC | PRN

## 2018-07-07 NOTE — Progress Notes (Signed)
Labs only

## 2018-07-07 NOTE — Patient Instructions (Signed)
Fasting labs drawn for CPE

## 2018-07-10 ENCOUNTER — Encounter: Payer: Self-pay | Admitting: Internal Medicine

## 2018-07-10 NOTE — Patient Instructions (Addendum)
It was a pleasure to see you today.  Left foot was injected with Depo-Medrol Marcaine and Xylocaine for plantar fasciitis symptoms.  Follow-up in 6 months.  Continue current medications.

## 2018-09-19 ENCOUNTER — Ambulatory Visit: Payer: Medicare HMO | Admitting: Adult Health

## 2018-09-23 ENCOUNTER — Other Ambulatory Visit: Payer: Self-pay

## 2018-09-23 ENCOUNTER — Encounter: Payer: Self-pay | Admitting: Adult Health

## 2018-09-23 ENCOUNTER — Ambulatory Visit: Payer: Medicare HMO | Admitting: Adult Health

## 2018-09-23 DIAGNOSIS — G4733 Obstructive sleep apnea (adult) (pediatric): Secondary | ICD-10-CM

## 2018-09-23 NOTE — Progress Notes (Signed)
@Patient  ID: Dawn Herrera, female    DOB: [birth year], 68 y.o.   MRN: [id number]  Chief Complaint  Patient presents with  . Follow-up    OSA     Referring provider: Elby Showers, MD  HPI: 68 year old female followed for obstructive sleep apnea Patient was diagnosed with sleep apnea in 2012.  Initially tried an oral appliance but then switched to CPAP.  TEST/EVENTS :  NPSG 01/2011: AHI 25/hr  09/23/2018 Follow up : OSA  Patient presents for a one-year follow-up.  Patient has underlying moderate sleep apnea.  Patient is on CPAP at bedtime.  She says she is doing very well on CPAP.  She never misses a night.  Says she gets in about 7 to 8 hours each night.  CPAP download shows excellent compliance with daily average usage around 8 hours.  Patient is on CPAP 12 cm H2O.  AHI 0.6. Patient says she feels rested with no significant daytime sleepiness. Does feel CPAP pressure is too high on occasion, air feels forceful .      Allergies  Allergen Reactions  . Erythromycin Nausea Only    Immunization History  Administered Date(s) Administered  . Influenza,inj,Quad PF,6+ Mos 11/13/2014  . Influenza-Unspecified 12/11/2013  . Pneumococcal Conjugate-13 06/25/2018  . Tdap 06/25/2005  . Zoster 04/10/2011    Past Medical History:  Diagnosis Date  . Adenomatous polyp   . Allergic rhinitis   . Depression   . Hyperlipidemia   . Sleep apnea    wears mouth guard    Tobacco History: Social History   Tobacco Use  Smoking Status Former Smoker  . Packs/day: 0.50  . Years: 46.00  . Pack years: 23.00  . Types: Cigarettes  . Quit date: 01/12/2013  . Years since quitting: 5.6  Smokeless Tobacco Never Used   Counseling given: Not Answered   Outpatient Medications Prior to Visit  Medication Sig Dispense Refill  . calcium carbonate (OS-CAL) 600 MG TABS Take 600 mg by mouth 2 (two) times daily with a meal.    . cholecalciferol (VITAMIN D) 1000 UNITS tablet Take 1,000 Units by  mouth daily.    . Multiple Vitamins-Minerals (MULTIVITAMIN WITH MINERALS) tablet Take 1 tablet by mouth daily.    . rosuvastatin (CRESTOR) 10 MG tablet Take 10 mg by mouth daily.    Marland Kitchen triamcinolone cream (KENALOG) 0.1 % Apply 1 application topically 3 (three) times daily as needed. 60 g 1   No facility-administered medications prior to visit.      Review of Systems:   Constitutional:   No  weight loss, night sweats,  Fevers, chills, fatigue, or  lassitude.  HEENT:   No headaches,  Difficulty swallowing,  Tooth/dental problems, or  Sore throat,                No sneezing, itching, ear ache, nasal congestion, post nasal drip,   CV:  No chest pain,  Orthopnea, PND, swelling in lower extremities, anasarca, dizziness, palpitations, syncope.   GI  No heartburn, indigestion, abdominal pain, nausea, vomiting, diarrhea, change in bowel habits, loss of appetite, bloody stools.   Resp: No shortness of breath with exertion or at rest.  No excess mucus, no productive cough,  No non-productive cough,  No coughing up of blood.  No change in color of mucus.  No wheezing.  No chest wall deformity  Skin: no rash or lesions.  GU: no dysuria, change in color of urine, no urgency or frequency.  No flank  pain, no hematuria   MS:  No joint pain or swelling.  No decreased range of motion.  No back pain.    Physical Exam  BP 108/70 (BP Location: Right Arm, Cuff Size: Normal)   Pulse 81   Temp 98.3 F (36.8 C) (Oral)   Ht 5\' 4"  (1.626 m)   Wt 174 lb 6.4 oz (79.1 kg)   SpO2 96%   BMI 29.94 kg/m   GEN: A/Ox3; pleasant , NAD, well nourished    HEENT:  [place]/AT,    NOSE-clear, THROAT-clear, no lesions, no postnasal drip or exudate noted.  Class 2-3 MP airway   NECK:  Supple w/ fair ROM; no JVD; normal carotid impulses w/o bruits; no thyromegaly or nodules palpated; no lymphadenopathy.    RESP  Clear  P & A; w/o, wheezes/ rales/ or rhonchi. no accessory muscle use, no dullness to percussion  CARD:   RRR, no m/r/g, no peripheral edema, pulses intact, no cyanosis or clubbing.  GI:   Soft & nt; nml bowel sounds; no organomegaly or masses detected.   Musco: Warm bil, no deformities or joint swelling noted.   Neuro: alert, no focal deficits noted.    Skin: Warm, no lesions or rashes    Lab Results:  CBC   BNP No results found for: BNP  ProBNP No results found for: PROBNP  Imaging: No results found.    No flowsheet data found.  No results found for: NITRICOXIDE      Assessment & Plan:   OSA (obstructive sleep apnea) Excellent control and compliance on CPAP Will adjust CPAP pressure for comfort   Plan  Patient Instructions  Keep up the good work Continue on CPAP at bedtime Do not drive if sleepy. Work on healthy weight Follow-up with 1 year with Dr. Elsworth Soho and as needed         Rexene Edison, NP 09/23/2018

## 2018-09-23 NOTE — Addendum Note (Signed)
Addended by: Jannette Spanner on: 09/23/2018 03:47 PM   Modules accepted: Orders

## 2018-09-23 NOTE — Patient Instructions (Addendum)
Keep up the good work Continue on CPAP at bedtime Adjust CPAP pressure to 11 cm H2O .  CPAP download .  Do not drive if sleepy. Work on healthy weight Follow-up with 1 year with Dr. Elsworth Soho and as needed

## 2018-09-23 NOTE — Assessment & Plan Note (Addendum)
Excellent control and compliance on CPAP Will adjust CPAP pressure for comfort   Plan  Patient Instructions  Keep up the good work Continue on CPAP at bedtime Do not drive if sleepy. Work on healthy weight Follow-up with 1 year with Dr. Elsworth Soho and as needed

## 2018-10-11 DIAGNOSIS — G4733 Obstructive sleep apnea (adult) (pediatric): Secondary | ICD-10-CM | POA: Diagnosis not present

## 2018-10-21 DIAGNOSIS — R69 Illness, unspecified: Secondary | ICD-10-CM | POA: Diagnosis not present

## 2018-10-24 ENCOUNTER — Telehealth: Payer: Self-pay | Admitting: Internal Medicine

## 2018-10-24 ENCOUNTER — Telehealth: Payer: Self-pay | Admitting: Pulmonary Disease

## 2018-10-24 MED ORDER — ROSUVASTATIN CALCIUM 10 MG PO TABS: 10.0000 mg | ORAL_TABLET | Freq: Every day | ORAL | 0 refills | Status: DC

## 2018-10-24 NOTE — Telephone Encounter (Signed)
FYI: Returned call to patient.  Patient states the CPAP pressure adjustment she believes is 'too much.'  States she is feeling 'too much air blowing and cannot sleep.'  She also feels urges to belch now after using cpap.  She would like to have pressure lowered. Patient states she's been on cpap 'a long time and not sure why this seems so different.'    Contacted Jenny at [employer].  Had her review the order.  Sonia Baller states they had the patient adjusted to 14cm. Advised our order states 11cm Sonia Baller reviewed the order and states she will correct this 'right away.'  Advised I would contact the patient and let her know the setting was not adjusted correctly.    Patient was informed of the above and given Jenny's name if she has further questions or concerns.  Also, asked patient to call us back if she continues to have problems with cpap pressure.    Will route to T. Parrett as Juluis Rainier.   LOV 09/23/18 T. Parrett Keep up the good work Continue on CPAP at bedtime Adjust CPAP pressure to 11 cm H2O .  CPAP download .  Do not drive if sleepy. Work on healthy weight Follow-up with 1 year with Dr. Elsworth Soho and as needed

## 2018-10-24 NOTE — Telephone Encounter (Signed)
Please call her back next week to make sure this has been corrected and she is doing better.

## 2018-10-24 NOTE — Telephone Encounter (Signed)
Dawn Herrera [phone]  [facility] [address]  rosuvastatin (CRESTOR) 10 MG tablet   [name] called to say she needs a refill on above medication.

## 2018-10-25 NOTE — Telephone Encounter (Signed)
Will route message to Nurse to Hold until next week.

## 2018-10-28 NOTE — Telephone Encounter (Signed)
Called and spoke with pt asking if [employer] had been able to get her settings corrected on her CPAP and pt said that they were able to get them corrected. Pt said that she has finally been able to get some delightful sleep and is feeling much better.  Routing to TP as an  [place].

## 2018-11-11 DIAGNOSIS — G4733 Obstructive sleep apnea (adult) (pediatric): Secondary | ICD-10-CM | POA: Diagnosis not present

## 2018-12-11 DIAGNOSIS — G4733 Obstructive sleep apnea (adult) (pediatric): Secondary | ICD-10-CM | POA: Diagnosis not present

## 2018-12-24 ENCOUNTER — Other Ambulatory Visit: Payer: Self-pay

## 2018-12-24 ENCOUNTER — Other Ambulatory Visit: Payer: Medicare HMO | Admitting: Internal Medicine

## 2018-12-24 DIAGNOSIS — R7302 Impaired glucose tolerance (oral): Secondary | ICD-10-CM | POA: Diagnosis not present

## 2018-12-24 DIAGNOSIS — E782 Mixed hyperlipidemia: Secondary | ICD-10-CM | POA: Diagnosis not present

## 2018-12-25 LAB — HEMOGLOBIN A1C
Hgb A1c MFr Bld: 6.6 % of total Hgb — ABNORMAL HIGH (ref <5.7)
Mean Plasma Glucose: 143 (calc)
eAG (mmol/L): 7.9 (calc)

## 2018-12-25 LAB — HEPATIC FUNCTION PANEL
AG Ratio: 1.6 (calc) (ref 1.0–2.5)
ALT: 22 U/L (ref 6–29)
AST: 22 U/L (ref 10–35)
Albumin: 4.4 g/dL (ref 3.6–5.1)
Alkaline phosphatase (APISO): 104 U/L (ref 37–153)
Bilirubin, Direct: 0.1 mg/dL (ref 0.0–0.2)
Globulin: 2.7 g/dL (calc) (ref 1.9–3.7)
Indirect Bilirubin: 0.4 mg/dL (calc) (ref 0.2–1.2)
Total Bilirubin: 0.5 mg/dL (ref 0.2–1.2)
Total Protein: 7.1 g/dL (ref 6.1–8.1)

## 2018-12-25 LAB — LIPID PANEL
Cholesterol: 215 mg/dL — ABNORMAL HIGH (ref <200)
HDL: 53 mg/dL (ref >=50)
LDL Cholesterol (Calc): 122 mg/dL (calc) — ABNORMAL HIGH
Non-HDL Cholesterol (Calc): 162 mg/dL (calc) — ABNORMAL HIGH (ref <130)
Total CHOL/HDL Ratio: 4.1 (calc) (ref <5.0)
Triglycerides: 263 mg/dL — ABNORMAL HIGH (ref <150)

## 2018-12-26 ENCOUNTER — Encounter: Payer: Self-pay | Admitting: Internal Medicine

## 2018-12-26 ENCOUNTER — Other Ambulatory Visit: Payer: Self-pay

## 2018-12-26 ENCOUNTER — Ambulatory Visit (INDEPENDENT_AMBULATORY_CARE_PROVIDER_SITE_OTHER): Payer: Medicare HMO | Admitting: Internal Medicine

## 2018-12-26 VITALS — BP 110/70 | HR 77 | Temp 98.0°F | Ht 64.0 in | Wt 173.0 lb

## 2018-12-26 DIAGNOSIS — E1169 Type 2 diabetes mellitus with other specified complication: Secondary | ICD-10-CM | POA: Diagnosis not present

## 2018-12-26 DIAGNOSIS — E782 Mixed hyperlipidemia: Secondary | ICD-10-CM | POA: Diagnosis not present

## 2018-12-26 MED ORDER — ROSUVASTATIN CALCIUM 10 MG PO TABS: 10.0000 mg | ORAL_TABLET | Freq: Every day | ORAL | 1 refills | Status: DC

## 2018-12-26 NOTE — Progress Notes (Signed)
   Subjective:    Patient ID: Dawn Herrera, female    DOB: [birth year], 68 y.o.   MRN: [id number]  HPI  68 year old Female for follow up of hyperlipidemia and impaired glucose tolerance.  Has traveled to see family in [place] and [place].  She has a history of hyperlipidemia.  Has been tried on Zetia and also 5 to 10 mg of Crestor daily.  Recent hemoglobin A1c 6.6% and previously was 5.9% in 2018 and 6.5% in April 2020.  May not be following a strict low-calorie low-fat diet.  Triglycerides seem to be the main issue.  Currently triglycerides are 263 with total cholesterol of 215 and LDL cholesterol of 122.  In April total cholesterol was 200, triglycerides 243 and LDL was 113.   Review of Systems see above with no new complaints     Objective:   Physical Exam Spent 20 minutes speaking with her about importance of diet exercise and weight loss and controlling lipids and glucose.  She admits she probably has not been trying as hard as she should.  We talked about medication for glucose control but she does not want to do that.  She does not want to increase Crestor to 20 mg daily.  She has had flu vaccine.       Assessment & Plan:  Mixed hyperlipidemia due to type II diabetes mellitus  Plan: Patient asked that we give her 3 months to work on diet exercise and weight loss.  Follow-up here in January with repeat labs and office visit.

## 2019-01-10 DIAGNOSIS — G4733 Obstructive sleep apnea (adult) (pediatric): Secondary | ICD-10-CM | POA: Diagnosis not present

## 2019-01-11 NOTE — Patient Instructions (Signed)
We are continuing patient's same statin medication dose 10 mg daily.  Continue to work on diet exercise and weight loss and follow-up in January as she requests.  Received flu vaccine this season.

## 2019-01-30 DIAGNOSIS — R7303 Prediabetes: Secondary | ICD-10-CM | POA: Diagnosis not present

## 2019-02-10 DIAGNOSIS — G4733 Obstructive sleep apnea (adult) (pediatric): Secondary | ICD-10-CM | POA: Diagnosis not present

## 2019-03-12 DIAGNOSIS — G4733 Obstructive sleep apnea (adult) (pediatric): Secondary | ICD-10-CM | POA: Diagnosis not present

## 2019-03-20 ENCOUNTER — Encounter: Payer: Self-pay | Admitting: Internal Medicine

## 2019-03-20 DIAGNOSIS — E119 Type 2 diabetes mellitus without complications: Secondary | ICD-10-CM | POA: Diagnosis not present

## 2019-03-20 DIAGNOSIS — H0100A Unspecified blepharitis right eye, upper and lower eyelids: Secondary | ICD-10-CM | POA: Diagnosis not present

## 2019-03-20 DIAGNOSIS — H04123 Dry eye syndrome of bilateral lacrimal glands: Secondary | ICD-10-CM | POA: Diagnosis not present

## 2019-03-20 DIAGNOSIS — H2513 Age-related nuclear cataract, bilateral: Secondary | ICD-10-CM | POA: Diagnosis not present

## 2019-03-20 LAB — HM DIABETES EYE EXAM

## 2019-03-24 ENCOUNTER — Other Ambulatory Visit: Payer: Self-pay | Admitting: *Deleted

## 2019-03-24 DIAGNOSIS — Z87891 Personal history of nicotine dependence: Secondary | ICD-10-CM

## 2019-04-01 ENCOUNTER — Other Ambulatory Visit: Payer: Self-pay

## 2019-04-01 ENCOUNTER — Other Ambulatory Visit: Payer: Medicare HMO | Admitting: Internal Medicine

## 2019-04-01 DIAGNOSIS — E782 Mixed hyperlipidemia: Secondary | ICD-10-CM | POA: Diagnosis not present

## 2019-04-01 DIAGNOSIS — R7302 Impaired glucose tolerance (oral): Secondary | ICD-10-CM | POA: Diagnosis not present

## 2019-04-02 LAB — HEPATIC FUNCTION PANEL
AG Ratio: 1.7 (calc) (ref 1.0–2.5)
ALT: 26 U/L (ref 6–29)
AST: 27 U/L (ref 10–35)
Albumin: 4.4 g/dL (ref 3.6–5.1)
Alkaline phosphatase (APISO): 103 U/L (ref 37–153)
Bilirubin, Direct: 0.1 mg/dL (ref 0.0–0.2)
Globulin: 2.6 g/dL (calc) (ref 1.9–3.7)
Indirect Bilirubin: 0.4 mg/dL (calc) (ref 0.2–1.2)
Total Bilirubin: 0.5 mg/dL (ref 0.2–1.2)
Total Protein: 7 g/dL (ref 6.1–8.1)

## 2019-04-02 LAB — LIPID PANEL
Cholesterol: 160 mg/dL (ref <200)
HDL: 54 mg/dL (ref >=50)
LDL Cholesterol (Calc): 79 mg/dL (calc)
Non-HDL Cholesterol (Calc): 106 mg/dL (calc) (ref <130)
Total CHOL/HDL Ratio: 3 (calc) (ref <5.0)
Triglycerides: 167 mg/dL — ABNORMAL HIGH (ref <150)

## 2019-04-02 LAB — HEMOGLOBIN A1C
Hgb A1c MFr Bld: 6 % of total Hgb — ABNORMAL HIGH (ref <5.7)
Mean Plasma Glucose: 126 (calc)
eAG (mmol/L): 7 (calc)

## 2019-04-04 ENCOUNTER — Other Ambulatory Visit: Payer: Self-pay

## 2019-04-04 ENCOUNTER — Encounter: Payer: Self-pay | Admitting: Internal Medicine

## 2019-04-04 ENCOUNTER — Ambulatory Visit (INDEPENDENT_AMBULATORY_CARE_PROVIDER_SITE_OTHER): Payer: Medicare HMO | Admitting: Internal Medicine

## 2019-04-04 VITALS — BP 110/80 | HR 62 | Ht 64.0 in | Wt 162.0 lb

## 2019-04-04 DIAGNOSIS — R7302 Impaired glucose tolerance (oral): Secondary | ICD-10-CM | POA: Diagnosis not present

## 2019-04-04 DIAGNOSIS — E1169 Type 2 diabetes mellitus with other specified complication: Secondary | ICD-10-CM

## 2019-04-04 DIAGNOSIS — Z8709 Personal history of other diseases of the respiratory system: Secondary | ICD-10-CM | POA: Diagnosis not present

## 2019-04-04 DIAGNOSIS — E782 Mixed hyperlipidemia: Secondary | ICD-10-CM | POA: Diagnosis not present

## 2019-04-04 NOTE — Progress Notes (Signed)
   Subjective:    Patient ID: Dawn Herrera, female    DOB: Feb 08, 1951, 69 y.o.   MRN: [id number]  HPI 69 year old Female for 6 month  recheck.  History of hyperlipidemia and impaired glucose tolerance.  History of smoking.  Is going to be screened for lung cancer with low-dose CT.  Has started exercising more regularly and has lost 11 pounds.  Is on Crestor 10 mg daily for hyperlipidemia.  History of impaired glucose tolerance-with diet and exercise efforts hemoglobin A1c has decreased from 6.6% in October to 6% which is excellent.  With diet and exercise efforts, total cholesterol has increased from 2 15-1 60, triglycerides have decreased from 263 to 167 and LDL cholesterol has decreased from 122 to 79       Review of Systems see above no new complaints     Objective:   Physical Exam Blood pressure 110/80, pulse 62, BMI 27.81, weight 162 pounds.  Skin warm and dry.  Nodes none.  Neck is supple.  Chest clear to auscultation.  Cardiac exam regular rate and rhythm normal S1 and S2 without murmurs or gallops.  Extremities without edema.       Assessment & Plan:  I am very pleased with her progress with diet exercise and weight loss.  She has shown considerable improvement and lipid panel and impaired glucose tolerance.  She was congratulated on her hard work.  No change in medication.  Is to have low-dose CT scan for history of smoking.  Plan: Return in April for health maintenance exam and Medicare wellness visit with fasting labs.

## 2019-04-10 DIAGNOSIS — G4733 Obstructive sleep apnea (adult) (pediatric): Secondary | ICD-10-CM | POA: Diagnosis not present

## 2019-04-14 ENCOUNTER — Other Ambulatory Visit: Payer: Self-pay | Admitting: Family Medicine

## 2019-04-14 ENCOUNTER — Other Ambulatory Visit: Payer: Self-pay | Admitting: Internal Medicine

## 2019-04-14 DIAGNOSIS — Z1231 Encounter for screening mammogram for malignant neoplasm of breast: Secondary | ICD-10-CM

## 2019-04-21 ENCOUNTER — Ambulatory Visit (INDEPENDENT_AMBULATORY_CARE_PROVIDER_SITE_OTHER)
Admission: RE | Admit: 2019-04-21 | Discharge: 2019-04-21 | Disposition: A | Discharge status: Home / Self Care | Payer: Medicare HMO | Source: Ambulatory Visit | Attending: Acute Care | Admitting: Acute Care

## 2019-04-21 ENCOUNTER — Encounter: Payer: Self-pay | Admitting: Acute Care

## 2019-04-21 ENCOUNTER — Other Ambulatory Visit: Payer: Self-pay

## 2019-04-21 ENCOUNTER — Ambulatory Visit (INDEPENDENT_AMBULATORY_CARE_PROVIDER_SITE_OTHER): Payer: Medicare HMO | Admitting: Acute Care

## 2019-04-21 VITALS — BP 112/62 | HR 0 | Temp 97.0°F | Ht 64.0 in | Wt 163.6 lb

## 2019-04-21 DIAGNOSIS — Z87891 Personal history of nicotine dependence: Secondary | ICD-10-CM

## 2019-04-21 NOTE — Progress Notes (Signed)
Shared Decision Making Visit Lung Cancer Screening Program [phone])   Eligibility:  Age 69 y.o.  Pack Years Smoking History Calculation 50 pack years (# packs/per year x # years smoked)  Recent History of coughing up blood  no  Unexplained weight loss? no ( >Than 15 pounds within the last 6 months )  Prior History Lung / other cancer no (Diagnosis within the last 5 years already requiring surveillance chest CT Scans).  Smoking Status Former Smoker  Former Smokers: Years since quit: 6 years  Quit Date: 01/12/2013  Visit Components:  Discussion included one or more decision making aids. yes  Discussion included risk/benefits of screening. yes  Discussion included potential follow up diagnostic testing for abnormal scans. yes  Discussion included meaning and risk of over diagnosis. yes  Discussion included meaning and risk of False Positives. yes  Discussion included meaning of total radiation exposure. yes  Counseling Included:  Importance of adherence to annual lung cancer LDCT screening. yes  Impact of comorbidities on ability to participate in the program. yes  Ability and willingness to under diagnostic treatment. yes  Smoking Cessation Counseling:  Current Smokers:   Discussed importance of smoking cessation. yes  Information about tobacco cessation classes and interventions provided to patient. yes  Patient provided with "ticket" for LDCT Scan. yes  Symptomatic Patient. no  Counseling: NA  Diagnosis Code: Tobacco Use Z72.0  Asymptomatic Patient yes  Counseling (Intermediate counseling: > three minutes counseling) [id number]  Former Smokers:   Discussed the importance of maintaining cigarette abstinence. yes  Diagnosis Code: Personal History of Nicotine Dependence. [id number]  Information about tobacco cessation classes and interventions provided to patient. Yes  Patient provided with "ticket" for LDCT Scan. yes  Written Order for Lung Cancer  Screening with LDCT placed in Epic. Yes (CT Chest Lung Cancer Screening Low Dose W/O CM) [id number] Z12.2-Screening of respiratory organs Z87.891-Personal history of nicotine dependence  BP 112/62 (BP Location: Left Arm, Cuff Size: Normal)   Pulse (!) 0   Temp (!) 97 F (36.1 C) (Oral)   Ht 5\' 4"  (1.626 m)   Wt 163 lb 9.6 oz (74.2 kg)   SpO2 95%   BMI 28.08 kg/m    I spent 25 minutes of face to face time with Ms. Heins discussing the risks and benefits of lung cancer screening. We viewed a power point together that explained in detail the above noted topics. We took the time to pause the power point at intervals to allow for questions to be asked and answered to ensure understanding. We discussed that she had taken the single most powerful action possible to decrease her risk of developing lung cancer when she quit smoking. I counseled her to remain smoke free, and to contact me if she ever had the desire to smoke again so that I can provide resources and tools to help support the effort to remain smoke free. We discussed the time and location of the scan, and that either  Doroteo Glassman RN or I will call with the results within  24-48 hours of receiving them. She has my card and contact information in the event she needs to speak with me, in addition to a copy of the power point we reviewed as a resource. She verbalized understanding of all of the above and had no further questions upon leaving the office.     I explained to the patient that there has been a high incidence of coronary artery disease noted on these exams. I  explained that this is a non-gated exam therefore degree or severity cannot be determined. This patient is currently on statin therapy. I have asked the patient to follow-up with their PCP regarding any incidental finding of coronary artery disease and management with diet or medication as they feel is clinically indicated. The patient verbalized understanding of the above and had  no further questions.  This appointment was over 30  minutes long with over 50% of the time being direct face to face patient care, assessment , plan of care , and follow up,       Magdalen Spatz, NP 04/21/2019 2:11 PM

## 2019-04-21 NOTE — Patient Instructions (Signed)
Thank you for participating in the [facility]. It was our pleasure to meet you today. We will call you with the results of your scan within the next few days. Your scan will be assigned a Lung RADS category score by the physicians reading the scans.  This Lung RADS score determines follow up scanning.  See below for description of categories, and follow up screening recommendations. We will be in touch to schedule your follow up screening annually or based on recommendations of our providers. We will fax a copy of your scan results to your Primary Care Physician, or the physician who referred you to the program, to ensure they have the results. Please call the office if you have any questions or concerns regarding your scanning experience or results.  Our office number is [phone]. Please speak with Denise Phelps, RN. She is our Lung Cancer Screening RN. If she is unavailable when you call, please have the office staff send her a message. She will return your call at her earliest convenience. Remember, if your scan is normal, we will scan you annually as long as you continue to meet the criteria for the program. (Age 55-77, Current smoker or smoker who has quit within the last 15 years). If you are a smoker, remember, quitting is the single most powerful action that you can take to decrease your risk of lung cancer and other pulmonary, breathing related problems. We know quitting is hard, and we are here to help.  Please let us know if there is anything we can do to help you meet your goal of quitting. If you are a former smoker, congratulations. We are proud of you! Remain smoke free! Remember you can refer friends or family members through the number above.  We will screen them to make sure they meet criteria for the program. Thank you for helping us take better care of you by participating in Lung Screening.  Lung RADS Categories:  Lung RADS 1: no nodules  or definitely non-concerning nodules.  Recommendation is for a repeat annual scan in 12 months.  Lung RADS 2:  nodules that are non-concerning in appearance and behavior with a very low likelihood of becoming an active cancer. Recommendation is for a repeat annual scan in 12 months.  Lung RADS 3: nodules that are probably non-concerning , includes nodules with a low likelihood of becoming an active cancer.  Recommendation is for a 6-month repeat screening scan. Often noted after an upper respiratory illness. We will be in touch to make sure you have no questions, and to schedule your 6-month scan.  Lung RADS 4 A: nodules with concerning findings, recommendation is most often for a follow up scan in 3 months or additional testing based on our provider's assessment of the scan. We will be in touch to make sure you have no questions and to schedule the recommended 3 month follow up scan.  Lung RADS 4 B:  indicates findings that are concerning. We will be in touch with you to schedule additional diagnostic testing based on our provider's  assessment of the scan.   

## 2019-04-24 ENCOUNTER — Other Ambulatory Visit: Payer: Self-pay | Admitting: *Deleted

## 2019-04-24 DIAGNOSIS — Z87891 Personal history of nicotine dependence: Secondary | ICD-10-CM

## 2019-04-24 NOTE — Progress Notes (Signed)
Please call patient and let them  know their  low dose Ct was read as a Lung RADS 2: nodules that are benign in appearance and behavior with a very low likelihood of becoming a clinically active cancer due to size or lack of growth. Recommendation per radiology is for a repeat LDCT in 12 months. Please let them  know we will order and schedule their  annual screening scan for 04/2020. Please let them  know there was notation of CAD on their  scan.  Please remind the patient  that this is a non-gated exam therefore degree or severity of disease  cannot be determined. Please have them  follow up with their PCP regarding potential risk factor modification, dietary therapy or pharmacologic therapy if clinically indicated. Pt.  is  currently on statin therapy. Please place order for annual  screening scan for  04/2020 and fax results to PCP. Thanks so much. 

## 2019-05-06 NOTE — Patient Instructions (Signed)
I am very pleased with your efforts with diet exercise and weight loss.  Continue current regimen and follow-up in April for health maintenance exam and fasting labs.

## 2019-05-21 ENCOUNTER — Ambulatory Visit
Admission: RE | Admit: 2019-05-21 | Discharge: 2019-05-21 | Disposition: A | Discharge status: Home / Self Care | Payer: Medicare HMO | Source: Ambulatory Visit | Attending: Internal Medicine | Admitting: Internal Medicine

## 2019-05-21 ENCOUNTER — Other Ambulatory Visit: Payer: Self-pay

## 2019-05-21 DIAGNOSIS — Z1231 Encounter for screening mammogram for malignant neoplasm of breast: Secondary | ICD-10-CM

## 2019-06-12 ENCOUNTER — Ambulatory Visit: Payer: Medicare HMO | Attending: Internal Medicine

## 2019-06-12 DIAGNOSIS — R69 Illness, unspecified: Secondary | ICD-10-CM | POA: Diagnosis not present

## 2019-06-12 DIAGNOSIS — Z23 Encounter for immunization: Secondary | ICD-10-CM

## 2019-06-12 NOTE — Progress Notes (Signed)
   Covid-19 Vaccination Clinic  Name:  Dawn Herrera [facility]    MRN: [id number] DOB: 16-Jan-1951  06/12/2019  Ms. Dawn Herrera was observed post Covid-19 immunization for 15 minutes without incident. She was provided with Vaccine Information Sheet and instruction to access the V-Safe system.   Ms. Dawn Herrera was instructed to call 911 with any severe reactions post vaccine: Marland Kitchen Difficulty breathing  . Swelling of face and throat  . A fast heartbeat  . A bad rash all over body  . Dizziness and weakness   Immunizations Administered    Name Date Dose VIS Date Route   Pfizer COVID-19 Vaccine 06/12/2019  1:48 PM 0.3 mL 02/21/2019 Intramuscular   Manufacturer: [place]   Lot: [id number]   [place]: [id number]

## 2019-06-24 ENCOUNTER — Other Ambulatory Visit: Payer: Medicare HMO | Admitting: Internal Medicine

## 2019-06-27 ENCOUNTER — Encounter: Payer: Medicare HMO | Admitting: Internal Medicine

## 2019-06-30 ENCOUNTER — Other Ambulatory Visit: Payer: Self-pay | Admitting: Internal Medicine

## 2019-07-09 DIAGNOSIS — G4733 Obstructive sleep apnea (adult) (pediatric): Secondary | ICD-10-CM | POA: Diagnosis not present

## 2019-07-14 ENCOUNTER — Ambulatory Visit: Payer: Medicare HMO | Attending: Internal Medicine

## 2019-07-14 DIAGNOSIS — Z23 Encounter for immunization: Secondary | ICD-10-CM

## 2019-07-14 NOTE — Progress Notes (Signed)
   Covid-19 Vaccination Clinic  Name:  Dawn Herrera [facility]    MRN: [id number] DOB: [birth year]  07/14/2019  Ms. Dawn Herrera was observed post Covid-19 immunization for 15 minutes without incident. She was provided with Vaccine Information Sheet and instruction to access the V-Safe system.   Ms. Dawn Herrera was instructed to call 911 with any severe reactions post vaccine: Marland Kitchen Difficulty breathing  . Swelling of face and throat  . A fast heartbeat  . A bad rash all over body  . Dizziness and weakness   Immunizations Administered    Name Date Dose VIS Date Route   Pfizer COVID-19 Vaccine 07/14/2019  9:47 AM 0.3 mL 05/07/2018 Intramuscular   Manufacturer: [place]   Lot: [id number]   [place]: [id number]

## 2019-09-02 ENCOUNTER — Other Ambulatory Visit: Payer: Self-pay

## 2019-09-02 ENCOUNTER — Other Ambulatory Visit: Payer: Medicare HMO | Admitting: Internal Medicine

## 2019-09-02 DIAGNOSIS — R7302 Impaired glucose tolerance (oral): Secondary | ICD-10-CM

## 2019-09-02 DIAGNOSIS — M722 Plantar fascial fibromatosis: Secondary | ICD-10-CM | POA: Diagnosis not present

## 2019-09-02 DIAGNOSIS — Z1329 Encounter for screening for other suspected endocrine disorder: Secondary | ICD-10-CM

## 2019-09-02 DIAGNOSIS — Z0000 Encounter for general adult medical examination without abnormal findings: Secondary | ICD-10-CM | POA: Diagnosis not present

## 2019-09-02 DIAGNOSIS — G4733 Obstructive sleep apnea (adult) (pediatric): Secondary | ICD-10-CM

## 2019-09-02 DIAGNOSIS — E782 Mixed hyperlipidemia: Secondary | ICD-10-CM

## 2019-09-03 LAB — CBC WITH DIFFERENTIAL/PLATELET
Absolute Monocytes: 410 cells/uL (ref 200–950)
Basophils Absolute: 8 cells/uL (ref 0–200)
Basophils Relative: 0.2 %
Eosinophils Absolute: 119 cells/uL (ref 15–500)
Eosinophils Relative: 2.9 %
HCT: 44.3 % (ref 35.0–45.0)
Hemoglobin: 14.7 g/dL (ref 11.7–15.5)
Lymphs Abs: 1189 cells/uL (ref 850–3900)
MCH: 29 pg (ref 27.0–33.0)
MCHC: 33.2 g/dL (ref 32.0–36.0)
MCV: 87.4 fL (ref 80.0–100.0)
MPV: 11.2 fL (ref 7.5–12.5)
Monocytes Relative: 10 %
Neutro Abs: 2374 cells/uL (ref 1500–7800)
Neutrophils Relative %: 57.9 %
Platelets: 205 10*3/uL (ref 140–400)
RBC: 5.07 10*6/uL (ref 3.80–5.10)
RDW: 13.6 % (ref 11.0–15.0)
Total Lymphocyte: 29 %
WBC: 4.1 10*3/uL (ref 3.8–10.8)

## 2019-09-03 LAB — COMPLETE METABOLIC PANEL WITH GFR
AG Ratio: 1.5 (calc) (ref 1.0–2.5)
ALT: 29 U/L (ref 6–29)
AST: 23 U/L (ref 10–35)
Albumin: 4.3 g/dL (ref 3.6–5.1)
Alkaline phosphatase (APISO): 106 U/L (ref 37–153)
BUN/Creatinine Ratio: 18 (calc) (ref 6–22)
BUN: 17 mg/dL (ref 7–25)
CO2: 28 mmol/L (ref 20–32)
Calcium: 10.4 mg/dL (ref 8.6–10.4)
Chloride: 106 mmol/L (ref 98–110)
Creat: 0.95 mg/dL — ABNORMAL HIGH (ref 0.60–0.93)
GFR, Est African American: 70 mL/min/{1.73_m2} (ref >=60)
GFR, Est Non African American: 61 mL/min/{1.73_m2} (ref >=60)
Globulin: 2.8 g/dL (calc) (ref 1.9–3.7)
Glucose, Bld: 113 mg/dL — ABNORMAL HIGH (ref 65–99)
Potassium: 4.4 mmol/L (ref 3.5–5.3)
Sodium: 142 mmol/L (ref 135–146)
Total Bilirubin: 0.7 mg/dL (ref 0.2–1.2)
Total Protein: 7.1 g/dL (ref 6.1–8.1)

## 2019-09-03 LAB — HEMOGLOBIN A1C
Hgb A1c MFr Bld: 6.1 % of total Hgb — ABNORMAL HIGH (ref <5.7)
Mean Plasma Glucose: 128 (calc)
eAG (mmol/L): 7.1 (calc)

## 2019-09-03 LAB — LIPID PANEL
Cholesterol: 193 mg/dL (ref <200)
HDL: 50 mg/dL (ref >=50)
LDL Cholesterol (Calc): 108 mg/dL (calc) — ABNORMAL HIGH
Non-HDL Cholesterol (Calc): 143 mg/dL (calc) — ABNORMAL HIGH (ref <130)
Total CHOL/HDL Ratio: 3.9 (calc) (ref <5.0)
Triglycerides: 234 mg/dL — ABNORMAL HIGH (ref <150)

## 2019-09-03 LAB — TSH: TSH: 3.3 mIU/L (ref 0.40–4.50)

## 2019-09-09 ENCOUNTER — Encounter: Payer: Medicare HMO | Admitting: Internal Medicine

## 2019-09-18 ENCOUNTER — Other Ambulatory Visit: Payer: Self-pay

## 2019-09-18 ENCOUNTER — Ambulatory Visit (INDEPENDENT_AMBULATORY_CARE_PROVIDER_SITE_OTHER): Payer: Medicare HMO | Admitting: Internal Medicine

## 2019-09-18 VITALS — BP 120/70 | HR 70 | Ht 64.0 in | Wt 167.0 lb

## 2019-09-18 DIAGNOSIS — G4733 Obstructive sleep apnea (adult) (pediatric): Secondary | ICD-10-CM | POA: Diagnosis not present

## 2019-09-18 DIAGNOSIS — I700 Atherosclerosis of aorta: Secondary | ICD-10-CM | POA: Diagnosis not present

## 2019-09-18 DIAGNOSIS — E782 Mixed hyperlipidemia: Secondary | ICD-10-CM | POA: Diagnosis not present

## 2019-09-18 DIAGNOSIS — Z8709 Personal history of other diseases of the respiratory system: Secondary | ICD-10-CM

## 2019-09-18 DIAGNOSIS — Z0000 Encounter for general adult medical examination without abnormal findings: Secondary | ICD-10-CM | POA: Diagnosis not present

## 2019-09-18 DIAGNOSIS — Z23 Encounter for immunization: Secondary | ICD-10-CM

## 2019-09-18 DIAGNOSIS — E1169 Type 2 diabetes mellitus with other specified complication: Secondary | ICD-10-CM

## 2019-09-18 DIAGNOSIS — R7302 Impaired glucose tolerance (oral): Secondary | ICD-10-CM

## 2019-09-18 DIAGNOSIS — Z86010 Personal history of colonic polyps: Secondary | ICD-10-CM | POA: Diagnosis not present

## 2019-09-18 DIAGNOSIS — Z860101 Personal history of adenomatous and serrated colon polyps: Secondary | ICD-10-CM

## 2019-09-18 LAB — POCT URINALYSIS DIPSTICK
Appearance: NEGATIVE
Bilirubin, UA: NEGATIVE
Blood, UA: NEGATIVE
Glucose, UA: POSITIVE — AB
Ketones, UA: NEGATIVE
Leukocytes, UA: NEGATIVE
Nitrite, UA: NEGATIVE
Odor: NEGATIVE
Protein, UA: NEGATIVE
Spec Grav, UA: 1.015 (ref 1.010–1.025)
Urobilinogen, UA: 0.2 E.U./dL
pH, UA: 6 (ref 5.0–8.0)

## 2019-09-18 NOTE — Patient Instructions (Addendum)
Watch diet and exercise. Have Shingrix vaccines. Pneumococcal 23 given today.  RTC in 6 months for office visit and fasting labs. We have sent Dr. Kathline Magic office at fax to inquire as to when your next colonoscopy is due. Pulmonary has recommended annual CT screening for lung cancer due to history of smoking.

## 2019-09-18 NOTE — Progress Notes (Signed)
Subjective:    Patient ID: Dawn Herrera, female    DOB: [birth year], 69 y.o.   MRN: [id number]  HPI 69 year old Female in today for health maintenance exam, Medicare wellness and evaluation of medical issues.  She has a history of sleep apnea and is followed by Dr. Elsworth Soho in Pulmonology.  History of left plantar fasciitis and has seen podiatrist at [facility].  Longstanding history of hyperlipidemia and impaired glucose tolerance  Had issues with benign positional vertigo October 2015 that responded to Epley maneuver  In June 2015 she had an episode of epigastric pain.  Was diagnosed with H. pylori gastritis and was treated with 3 drug regimen.  Had endoscopy showing duodenitis and gastritis along with hiatal hernia.  History of vitamin D deficiency and adenomatous colon polyp.  She had colonoscopy in 2007 showing 1 small adenomatous polyp in the descending colon.  She did have repeat colonoscopy in January 2012 showing hyperplastic polyps by Dr. Michail Sermon at [place].  She should check with an office regarding repeat testing if needed.    Bone density study done January 2014 with history of osteopenia  Is seeing Dr. Lawerance Bach for microscopic hematuria in 2007 with normal cystoscopy and ureteroscopy  Has seen Dr. Daylene Katayama in 2009 for impingement right shoulder with mild adhesive capsulitis  No known drug allergies  Family history: Father died at age 27 with congestive heart failure with history of diabetes.  Mother with history of coronary artery disease, MI, status post CABG.  1 brother and 4 sisters in good health.  Social history: She is retired.  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  She is married.  Husband is retired.  2 adult daughters.  She smokes 1/2 to 1 pack cigarettes daily for over 30 years.  Social alcohol consumption.  Husband is retired.  They enjoy traveling.    Review of Systems- gained 5 pounds since January 2021     Objective:    Physical Exam Blood pressure 120/70 pulse 70 pulse oximetry 95% weight 167 pounds BMI 28.67  Skin warm and dry.  Nodes none.  TMs are clear.  Neck is supple without JVD thyromegaly or carotid bruits.  Breast without masses.  Cardiac exam regular rate and rhythm normal S1 and S2.  Abdomen soft nondistended without hepatosplenomegaly masses or tenderness.  Neuro intact without focal deficits.  Affect thought and judgment are normal.  Pap deferred due to age.  Bimanual normal.  Labs reviewed showed fasting triglycerides of 234.  LDL is 108.  Total cholesterol 193.  Has been prescribed Crestor 10 mg daily.  May not be following a strict diabetic diet.  She does not want to go up on Crestor dose.  History of impaired glucose tolerance with hemoglobin A1c 6.1% and was 6% in January 2021.  TSH is within normal limits.  CBC and c-Met are within normal limits.  Fasting glucose 113.  Liver functions are normal.     Assessment & Plan:  Impaired glucose tolerance treated with diet  Hyperlipidemia-mixed treated with Crestor  Plan: Patient will watch her diet and continue to exercise and we will follow-up with lipid panel liver functions and hemoglobin A1c at time of 6 months recheck in January 2022.  Health maintenance-she has had 2 COVID-19 immunizations.  Received pneumococcal 23 today and has had Prevnar 13.  Given prescription for Shingrix vaccines.  Since she is a long-term smoker, it was suggested she have CT chest for lung cancer screening.  This was done in February 2021 and was normal.  They recommend repeat screening in 12 months.  She had mammogram in March 2021. Need to find out from Dr. Michail Sermon when her next colonoscopy is due. Have sent a fax to that office. Last one was in 2012.  Subjective:   Patient presents for Medicare Annual/Subsequent preventive examination.  Review Past Medical/Family/Social:   Risk Factors  Current exercise habits: Does  exercise and walk Dietary issues  discussed: Low-fat low carbohydrate  Cardiac risk factors: Hyperlipidemia and impaired glucose tolerance  Depression Screen  (Note: if answer to either of the following is "Yes", a more complete depression screening is indicated)   Over the past two weeks, have you felt down, depressed or hopeless? No  Over the past two weeks, have you felt little interest or pleasure in doing things? No Have you lost interest or pleasure in daily life? No Do you often feel hopeless? No Do you cry easily over simple problems? No   Activities of Daily Living  In your present state of health, do you have any difficulty performing the following activities?:   Driving? No  Managing money? No  Feeding yourself? No  Getting from bed to chair? No  Climbing a flight of stairs? No  Preparing food and eating?: No  Bathing or showering? No  Getting dressed: No  Getting to the toilet? No  Using the toilet:No  Moving around from place to place: No  In the past year have you fallen or had a near fall?:No  Are you sexually active? No  Do you have more than one partner? No   Hearing Difficulties: No  Do you often ask people to speak up or repeat themselves? Yes  do you experience ringing or noises in your ears? Yes  do you have difficulty understanding soft or whispered voices? Yes Do you feel that you have a problem with memory? No Do you often misplace items? Yes sometimes   Home Safety:  Do you have a smoke alarm at your residence? Yes Do you have grab bars in the bathroom? Yes Do you have throw rugs in your house? Yes   Cognitive Testing  Alert? Yes Normal Appearance?Yes  Oriented to person? Yes Place? Yes  Time? Yes  Recall of three objects? Yes  Can perform simple calculations? Yes  Displays appropriate judgment?Yes  Can read the correct time from a watch face?Yes   List the Names of Other Physician/Practitioners you currently use:  See referral list for the physicians patient is  currently seeing.     Review of Systems: See above   Objective:     General appearance: Appears stated age and mildly obese  Head: Normocephalic, without obvious abnormality, atraumatic  Eyes: conj clear, EOMi PEERLA  Ears: normal TM's and external ear canals both ears  Nose: Nares normal. Septum midline. Mucosa normal. No drainage or sinus tenderness.  Throat: lips, mucosa, and tongue normal; teeth and gums normal  Neck: no adenopathy, no carotid bruit, no JVD, supple, symmetrical, trachea midline and thyroid not enlarged, symmetric, no tenderness/mass/nodules  No CVA tenderness.  Lungs: clear to auscultation bilaterally  Breasts: normal appearance, no masses or tenderness  Heart: regular rate and rhythm, S1, S2 normal, no murmur, click, rub or gallop  Abdomen: soft, non-tender; bowel sounds normal; no masses, no organomegaly  Musculoskeletal: ROM normal in all joints, no crepitus, no deformity, Normal muscle strengthen. Back  is symmetric, no curvature. Skin: Skin color, texture, turgor normal. No  rashes or lesions  Lymph nodes: Cervical, supraclavicular, and axillary nodes normal.  Neurologic: CN 2 -12 Normal, Normal symmetric reflexes. Normal coordination and gait  Psych: Alert & Oriented x 3, Mood appear stable.    Assessment:    Annual wellness medicare exam   Plan:    During the course of the visit the patient was educated and counseled about appropriate screening and preventive services including:   Has had COVID-19 vaccine  Order given for Shingrix vaccine     Patient Instructions (the written plan) was given to the patient.  Medicare Attestation  I have personally reviewed:  The patient's medical and social history  Their use of alcohol, tobacco or illicit drugs  Their current medications and supplements  The patient's functional ability including ADLs,fall risks, home safety risks, cognitive, and hearing and visual impairment  Diet and physical activities   Evidence for depression or mood disorders  The patient's weight, height, BMI, and visual acuity have been recorded in the chart. I have made referrals, counseling, and provided education to the patient based on review of the above and I have provided the patient with a written personalized care plan for preventive services.

## 2019-09-19 ENCOUNTER — Telehealth: Payer: Self-pay | Admitting: Internal Medicine

## 2019-09-19 LAB — MICROALBUMIN / CREATININE URINE RATIO
Creatinine, Urine: 33 mg/dL (ref 20–275)
Microalb, Ur: <0.2 mg/dL

## 2019-09-19 NOTE — Telephone Encounter (Signed)
Patient has developed swollen red tender arm after pneumococcal immunization yesterday.  She Took some Naprosyn. She  called because she was concerned. I think it is a local reaction and should subside in a couple of days. Suggest warm  compresses.May take antiinflammatory med. Call if symptoms worsen or not improving in 48 hour. Does not have fever. MJB,MD

## 2019-10-07 DIAGNOSIS — G4733 Obstructive sleep apnea (adult) (pediatric): Secondary | ICD-10-CM | POA: Diagnosis not present

## 2019-10-11 ENCOUNTER — Encounter: Payer: Self-pay | Admitting: Internal Medicine

## 2019-10-13 ENCOUNTER — Telehealth: Payer: Self-pay | Admitting: Internal Medicine

## 2019-10-13 NOTE — Telephone Encounter (Signed)
Patient called back

## 2019-10-13 NOTE — Telephone Encounter (Signed)
LVM to call office. Just wanted to let he know her colonoscopy is not due until 11/2020

## 2019-11-07 DIAGNOSIS — G4733 Obstructive sleep apnea (adult) (pediatric): Secondary | ICD-10-CM | POA: Diagnosis not present

## 2019-12-08 DIAGNOSIS — G4733 Obstructive sleep apnea (adult) (pediatric): Secondary | ICD-10-CM | POA: Diagnosis not present

## 2019-12-18 DIAGNOSIS — R69 Illness, unspecified: Secondary | ICD-10-CM | POA: Diagnosis not present

## 2020-01-05 ENCOUNTER — Other Ambulatory Visit: Payer: Self-pay | Admitting: Internal Medicine

## 2020-01-05 DIAGNOSIS — G4733 Obstructive sleep apnea (adult) (pediatric): Secondary | ICD-10-CM | POA: Diagnosis not present

## 2020-03-16 ENCOUNTER — Other Ambulatory Visit: Payer: Self-pay

## 2020-03-16 ENCOUNTER — Other Ambulatory Visit: Payer: Medicare HMO | Admitting: Internal Medicine

## 2020-03-16 DIAGNOSIS — E1169 Type 2 diabetes mellitus with other specified complication: Secondary | ICD-10-CM

## 2020-03-16 DIAGNOSIS — R7302 Impaired glucose tolerance (oral): Secondary | ICD-10-CM

## 2020-03-16 DIAGNOSIS — E782 Mixed hyperlipidemia: Secondary | ICD-10-CM | POA: Diagnosis not present

## 2020-03-17 LAB — LIPID PANEL
Cholesterol: 205 mg/dL — ABNORMAL HIGH (ref <200)
HDL: 56 mg/dL (ref >=50)
LDL Cholesterol (Calc): 115 mg/dL (calc) — ABNORMAL HIGH
Non-HDL Cholesterol (Calc): 149 mg/dL (calc) — ABNORMAL HIGH (ref <130)
Total CHOL/HDL Ratio: 3.7 (calc) (ref <5.0)
Triglycerides: 222 mg/dL — ABNORMAL HIGH (ref <150)

## 2020-03-17 LAB — HEPATIC FUNCTION PANEL
AG Ratio: 2 (calc) (ref 1.0–2.5)
ALT: 23 U/L (ref 6–29)
AST: 20 U/L (ref 10–35)
Albumin: 4.5 g/dL (ref 3.6–5.1)
Alkaline phosphatase (APISO): 88 U/L (ref 37–153)
Bilirubin, Direct: 0.1 mg/dL (ref 0.0–0.2)
Globulin: 2.3 g/dL (calc) (ref 1.9–3.7)
Indirect Bilirubin: 0.3 mg/dL (calc) (ref 0.2–1.2)
Total Bilirubin: 0.4 mg/dL (ref 0.2–1.2)
Total Protein: 6.8 g/dL (ref 6.1–8.1)

## 2020-03-17 LAB — HEMOGLOBIN A1C
Hgb A1c MFr Bld: 6.4 % of total Hgb — ABNORMAL HIGH (ref <5.7)
Mean Plasma Glucose: 137 mg/dL
eAG (mmol/L): 7.6 mmol/L

## 2020-03-19 ENCOUNTER — Ambulatory Visit (INDEPENDENT_AMBULATORY_CARE_PROVIDER_SITE_OTHER): Payer: Medicare HMO | Admitting: Internal Medicine

## 2020-03-19 ENCOUNTER — Encounter: Payer: Self-pay | Admitting: Internal Medicine

## 2020-03-19 ENCOUNTER — Other Ambulatory Visit: Payer: Self-pay

## 2020-03-19 VITALS — BP 120/80 | HR 77 | Ht 64.0 in | Wt 168.0 lb

## 2020-03-19 DIAGNOSIS — L309 Dermatitis, unspecified: Secondary | ICD-10-CM

## 2020-03-19 DIAGNOSIS — H0100A Unspecified blepharitis right eye, upper and lower eyelids: Secondary | ICD-10-CM | POA: Diagnosis not present

## 2020-03-19 DIAGNOSIS — Z6828 Body mass index (BMI) 28.0-28.9, adult: Secondary | ICD-10-CM

## 2020-03-19 DIAGNOSIS — E1169 Type 2 diabetes mellitus with other specified complication: Secondary | ICD-10-CM | POA: Diagnosis not present

## 2020-03-19 DIAGNOSIS — H40053 Ocular hypertension, bilateral: Secondary | ICD-10-CM | POA: Diagnosis not present

## 2020-03-19 DIAGNOSIS — E782 Mixed hyperlipidemia: Secondary | ICD-10-CM

## 2020-03-19 DIAGNOSIS — H04123 Dry eye syndrome of bilateral lacrimal glands: Secondary | ICD-10-CM | POA: Diagnosis not present

## 2020-03-19 DIAGNOSIS — H2513 Age-related nuclear cataract, bilateral: Secondary | ICD-10-CM | POA: Diagnosis not present

## 2020-03-19 DIAGNOSIS — E119 Type 2 diabetes mellitus without complications: Secondary | ICD-10-CM | POA: Diagnosis not present

## 2020-03-19 MED ORDER — TRIAMCINOLONE ACETONIDE 0.1 % EX CREA: TOPICAL_CREAM | CUTANEOUS | 1 refills | Status: DC

## 2020-03-19 NOTE — Progress Notes (Signed)
   Subjective:    Patient ID: Dawn Herrera, female    DOB: [birth year], 70 y.o.   MRN: [id number]  HPI  70 year old Female 6 month recheck. Frustrated with her weight. Discussed additional measures for lipid control She is interested in [place] which she saw on TV   Hemoglobin A1c 6.4% 6.1% history of hyperlipidemia (mixed).  Total cholesterol was 205 and was 193 in June.  Triglycerides have decreased from 234 in June to 222 now.  LDL was 108 in June and is now 115.  Currently on Crestor 10 mg daily.  History of glaucoma treated with Xalatan.   Suggested she defer Shingrix vaccine until pandemic and improves.  Has had 3 COVID-19 vaccines.  Had high-dose flu vaccine in October.  Pneumococcal 23 vaccine given July 2021.  Tetanus immunization is not up-to-date but Medicare will not pay for it unless there is an injury.   Review of Systems- interested in weight loss clinic with Dr. Leafy Ro. Has rash on top of foot that has psoriatric features. Try Triamcinolone cream 0.1% 3 times a day on  foot.     Objective:   Physical Exam Blood pressure 120/80, pulse 77 regular pulse oximetry 94% weight 168 pounds height 5 feet 4 inches BMI 28.84.  Weight is stable from last visit in July when it was 167 pounds.  Skin: Dorsum of right foot there is an erythematous rash that is psoriatic features.  She will try triamcinolone cream 3 times a day and see if it resolves.  If not may need to see dermatologist.  Neck is supple without JVD thyromegaly or carotid bruits.  Chest is clear to auscultation without rales or wheezing.  Cardiac exam: Regular rate and rhythm normal S1 and S2 without murmurs or gallops.  No lower extremity pitting edema.  Affect thought and judgment are normal.       Assessment & Plan:  BMI 28-continue to work on diet exercise and weight loss  Dermatitis top of right foot-?  Eczema versus psoriasis.  We will try triamcinolone cream 3 times a day.  If no improvement, will see  dermatologist  Type 2 diabetes mellitus-treated with diet.  Is interested in [place].  She is interested in Dr. Migdalia Dk weight loss clinic and that can be discussed with Dr. Leafy Ro.  Mixed hypercholesterolemia.  Slight improvement in triglycerides from 234 to 222.  LDL has increased from 108 to 115.  Does not want to be on statin medication.  History of glaucoma treated with Xalatan  Plan: She was given contact information for Dr. Migdalia Dk weight loss clinic.  Continue current medications.  Continue to work on diet exercise and weight loss and follow-up here in 6 months for Medicare wellness and health maintenance exam.  Try triamcinolone 0.1% cream up to 3 times a day for rash on dorsum of right foot.  History of sleep apnea

## 2020-03-25 ENCOUNTER — Ambulatory Visit: Payer: Medicare HMO | Admitting: Obstetrics and Gynecology

## 2020-03-25 ENCOUNTER — Encounter: Payer: Self-pay | Admitting: Obstetrics and Gynecology

## 2020-03-25 ENCOUNTER — Other Ambulatory Visit: Payer: Self-pay

## 2020-03-25 VITALS — BP 138/82 | HR 63 | Ht 63.0 in | Wt 168.0 lb

## 2020-03-25 DIAGNOSIS — N898 Other specified noninflammatory disorders of vagina: Secondary | ICD-10-CM | POA: Diagnosis not present

## 2020-03-25 DIAGNOSIS — N763 Subacute and chronic vulvitis: Secondary | ICD-10-CM

## 2020-03-25 LAB — WET PREP FOR TRICH, YEAST, CLUE

## 2020-03-25 MED ORDER — NYSTATIN 100000 UNIT/GM EX OINT: 1.0000 "application " | TOPICAL_OINTMENT | Freq: Two times a day (BID) | CUTANEOUS | 0 refills | Status: DC

## 2020-03-25 NOTE — Progress Notes (Signed)
GYNECOLOGY  VISIT   HPI: 70 y.o.   Married  Caucasian  female   G2P2 with Patient's last menstrual period was 03/14/1995 (approximate).   here for vulvar itching/irritation off and on for several years. Dr.Baxley has given Triamcinolone cream without much relief.  Wakes up at night itching.  Tylenol PM 1/2 tab works to help her sleep.  Has borderline diabetes. A1C 6.4 03/16/20. Patient will go to the weight loss clinic.  Uses free and clear detergent.  Shower soap is [employer].   Has elevated eye pressure, borderline glaucoma.  GYNECOLOGIC HISTORY: Patient's last menstrual period was 03/14/1995 (approximate). Contraception:  Tubal Menopausal hormone therapy:  none Last mammogram: 05-21-19 3D/Neg/density B/BiRads1 Last pap smear:  06-11-17 Neg:Neg HR HPV, 09-24-15 Neg, 10-10-11 Neg        OB History    Gravida  2   Para  2   Term      Preterm      AB      Living  2     SAB      IAB      Ectopic      Multiple      Live Births                 Patient Active Problem List   Diagnosis Date Noted  . Osteopenia 10/12/2012  . History of COPD 10/12/2012  . Other and unspecified hyperlipidemia 10/12/2012  . Benign microscopic hematuria 10/10/2011  . OSA (obstructive sleep apnea) 01/10/2011  . Hx of adenomatous colonic polyps 10/11/2010  . Urinary tract bacterial infections 10/11/2010  . Hyperlipidemia 10/11/2010  . Smoking history 10/11/2010  . Vitamin D deficiency 08/26/2010    Past Medical History:  Diagnosis Date  . Adenomatous polyp   . Allergic rhinitis   . Depression   . Hyperlipidemia   . Prediabetes   . Sleep apnea    wears mouth guard    Past Surgical History:  Procedure Laterality Date  . COLONOSCOPY  03/21/2010  . TUBAL LIGATION  2004    Current Outpatient Medications  Medication Sig Dispense Refill  . calcium carbonate (OS-CAL) 600 MG TABS Take 600 mg by mouth 2 (two) times daily with a meal.    . cholecalciferol (VITAMIN D) 1000 UNITS  tablet Take 1,000 Units by mouth daily.    . Multiple Vitamins-Minerals (MULTIVITAMIN WITH MINERALS) tablet Take 1 tablet by mouth daily.    . rosuvastatin (CRESTOR) 10 MG tablet TAKE 1 TABLET BY MOUTH EVERY DAY 90 tablet 1  . triamcinolone (KENALOG) 0.1 % aplly to top of foot 3 times a day until healed 30 g 1  . latanoprost (XALATAN) 0.005 % ophthalmic solution SMARTSIG:In Eye(s) (Patient not taking: Reported on 03/25/2020)     No current facility-administered medications for this visit.     ALLERGIES: Erythromycin  Family History  Problem Relation Age of Onset  . Heart disease Father   . Diabetes Other     Social History   Socioeconomic History  . Marital status: Married    Spouse name: Sonia Side  . Number of children: Y  . Years of education: Not on file  . Highest education level: Not on file  Occupational History  . Occupation: PRE-CERTIFICATION    Employer: [employer]    Comment: [employer]  Tobacco Use  . Smoking status: Former Smoker    Packs/day: 1.00    Years: 50.00    Pack years: 50.00    Types:  Cigarettes    Quit date: 01/12/2013    Years since quitting: 7.2  . Smokeless tobacco: Never Used  Vaping Use  . Vaping Use: Never used  Substance and Sexual Activity  . Alcohol use: Yes    Alcohol/week: 1.0 standard drink    Types: 1 Standard drinks or equivalent per week  . Drug use: No  . Sexual activity: Not Currently  Other Topics Concern  . Not on file  Social History Narrative  . Not on file   Social Determinants of Health   Financial Resource Strain: Not on file  Food Insecurity: Not on file  Transportation Needs: Not on file  Physical Activity: Not on file  Stress: Not on file  Social Connections: Not on file  Intimate Partner Violence: Not on file    Review of Systems  Skin:       Vulvar irritation  All other systems reviewed and are negative.   PHYSICAL EXAMINATION:    BP 138/82   Pulse 63   Ht 5\' 3"  (1.6 m)    Wt 168 lb (76.2 kg)   LMP 03/14/1995 (Approximate)   SpO2 98%   BMI 29.76 kg/m     General appearance: alert, cooperative and appears stated age Head: Normocephalic, without obvious abnormality, atraumatic Neck: no adenopathy, supple, symmetrical, trachea midline and thyroid normal to inspection and palpation Lungs: clear to auscultation bilaterally Heart: regular rate and rhythm Abdomen: soft, non-tender, no masses,  no organomegaly Extremities: extremities normal, atraumatic, no cyanosis or edema Skin: Skin color, texture, turgor normal. No rashes or lesions Lymph nodes: Cervical, supraclavicular, and axillary nodes normal. No abnormal inguinal nodes palpated Neurologic: Grossly normal  Pelvic: External genitalia:  Pink coloration to bilateral superior labia minora and clitoral region.               Urethra:  normal appearing urethra with no masses, tenderness or lesions              Bartholins and Skenes: normal                 Vagina: normal appearing vagina with normal color and discharge, no lesions              Cervix: no lesions                Bimanual Exam:  Uterus:  normal size, contour, position, consistency, mobility, non-tender              Adnexa: no mass, fullness, tenderness               Chaperone was present for exam.  ASSESSMENT  Chronic vulvitis.  Vaginal irritation.   PLAN  Wet prep - negative for yeast, clue cells, and trichomonas. Nystatin ointment bid x 1 week.  Ok to continue triamcinolone.  Return for vulvar biopsy.  Procedure explained.  She may have lichen simplex chronicus, lichen sclerosus, or chronic yeast infection.  I encouraged good blood sugar control.  Avoid products with perfumes and dyes.  She is using benadryl so she does not need vistaril.

## 2020-03-25 NOTE — Patient Instructions (Signed)
Vulva Biopsy  A vulva biopsy is a procedure in which a sample of tissue from the vulva is removed and examined under a microscope. The vulva is the outside part of the female genitals. The vulva includes the outside folds of skin (labia majora), the inner lips (labia minora), the clitoris, and the openings of the urethra and vagina. Your health care provider may order this procedure to diagnose a lesion, growth, rash, blister, or other tissue changes. This procedure may also be done to remove a mole or wart. Tell a health care provider about:  Any allergies you have.  All medicines you are taking, including vitamins, herbs, eye drops, creams, and over-the-counter medicines.  Any problems you or family members have had with anesthetic medicines.  Any blood disorders you have.  Any surgeries you have had.  Any medical conditions you have.  Whether you are pregnant or may be pregnant. What are the risks? Generally, this is a safe procedure. However, problems may occur, including:  Infection.  Bleeding.  Allergic reaction to medicines.  Damage to other nearby structures or organs.  Pain at the biopsy site. What happens before the procedure? Medicines Ask your health care provider about:  Changing or stopping your regular medicines. This is especially important if you are taking diabetes medicines or blood thinners.  Taking medicines such as aspirin and ibuprofen. These medicines can thin your blood. Do not take these medicines unless your health care provider tells you to take them.  Taking over-the-counter medicines, vitamins, herbs, and supplements. General instructions  Wear loose and comfortable clothing and underwear for the procedure.  Follow instructions from your health care provider about eating or drinking restrictions.  Ask your health care provider what steps will be taken to help prevent infection. These may include: ? Removing hair at the surgery  site. ? Washing skin with a germ-killing soap. ? Taking antibiotic medicine. What happens during the procedure?  You will be given a medicine to numb the area (local anesthetic).  A small sample of tissue will be removed (excised). This tissue will be sent to a lab for testing.  A medicine may be put on the biopsy site to help stop the bleeding.  The biopsy site may be closed with stitches (sutures). The procedure may vary among health care providers and hospitals. What happens after the procedure?  You may be given pain medicine.  You may be given antibiotic ointment.  It is up to you to get the results of your procedure. Ask your health care provider, or the department that is doing the procedure, when your results will be ready. Summary  A vulva biopsy is a procedure in which a sample of tissue from the vulva is removed and examined under a microscope.  Your health care provider may order this procedure to diagnose a lesion, growth, rash, blister, or other tissue changes.  Generally, this is a safe procedure. However, problems may occur, including infection, bleeding, or allergic reaction to medicines.  Ask your health care provider, or the department that is doing the procedure, when your results will be ready. This information is not intended to replace advice given to you by your health care provider. Make sure you discuss any questions you have with your health care provider. Document Revised: 08/30/2017 Document Reviewed: 08/30/2017 Elsevier Patient Education  2021 [employer].

## 2020-04-03 NOTE — Patient Instructions (Signed)
Continue to work on diet exercise and weight loss.  Information given on Dr. Migdalia Dk weight loss clinic.  Continue current medications.  Try triamcinolone cream up to 3 times daily for foot dermatitis.  Follow-up in 6 months for health maintenance exam and Medicare wellness visit.

## 2020-04-06 DIAGNOSIS — G4733 Obstructive sleep apnea (adult) (pediatric): Secondary | ICD-10-CM | POA: Diagnosis not present

## 2020-05-07 DIAGNOSIS — G4733 Obstructive sleep apnea (adult) (pediatric): Secondary | ICD-10-CM | POA: Diagnosis not present

## 2020-05-14 ENCOUNTER — Telehealth: Payer: Self-pay | Admitting: Acute Care

## 2020-05-14 DIAGNOSIS — H2513 Age-related nuclear cataract, bilateral: Secondary | ICD-10-CM | POA: Diagnosis not present

## 2020-05-14 DIAGNOSIS — H401121 Primary open-angle glaucoma, left eye, mild stage: Secondary | ICD-10-CM | POA: Diagnosis not present

## 2020-05-14 DIAGNOSIS — E119 Type 2 diabetes mellitus without complications: Secondary | ICD-10-CM | POA: Diagnosis not present

## 2020-05-14 DIAGNOSIS — H04123 Dry eye syndrome of bilateral lacrimal glands: Secondary | ICD-10-CM | POA: Diagnosis not present

## 2020-05-14 DIAGNOSIS — H0100A Unspecified blepharitis right eye, upper and lower eyelids: Secondary | ICD-10-CM | POA: Diagnosis not present

## 2020-05-17 ENCOUNTER — Other Ambulatory Visit: Payer: Self-pay | Admitting: Internal Medicine

## 2020-05-17 ENCOUNTER — Other Ambulatory Visit: Payer: Self-pay | Admitting: *Deleted

## 2020-05-17 DIAGNOSIS — Z1231 Encounter for screening mammogram for malignant neoplasm of breast: Secondary | ICD-10-CM

## 2020-05-17 DIAGNOSIS — Z87891 Personal history of nicotine dependence: Secondary | ICD-10-CM

## 2020-05-18 NOTE — Telephone Encounter (Signed)
I left a message for the patient to call me back at my number [phone] to schedule her LCS CT

## 2020-05-18 NOTE — Telephone Encounter (Signed)
I finally got to speak with Mrs. Dawn Herrera and her LCS CT has been scheduled on 05/21/2020 @ 9:30am at [place] in [facility]

## 2020-05-21 ENCOUNTER — Ambulatory Visit (HOSPITAL_BASED_OUTPATIENT_CLINIC_OR_DEPARTMENT_OTHER)
Admission: RE | Admit: 2020-05-21 | Discharge: 2020-05-21 | Disposition: A | Discharge status: Home / Self Care | Payer: Medicare HMO | Source: Ambulatory Visit | Attending: Internal Medicine | Admitting: Internal Medicine

## 2020-05-21 ENCOUNTER — Other Ambulatory Visit: Payer: Self-pay

## 2020-05-21 DIAGNOSIS — Z87891 Personal history of nicotine dependence: Secondary | ICD-10-CM | POA: Diagnosis not present

## 2020-05-24 ENCOUNTER — Other Ambulatory Visit: Payer: Self-pay

## 2020-05-24 ENCOUNTER — Ambulatory Visit (HOSPITAL_BASED_OUTPATIENT_CLINIC_OR_DEPARTMENT_OTHER)
Admission: RE | Admit: 2020-05-24 | Discharge: 2020-05-24 | Disposition: A | Discharge status: Home / Self Care | Payer: Medicare HMO | Source: Ambulatory Visit | Attending: Internal Medicine | Admitting: Internal Medicine

## 2020-05-24 ENCOUNTER — Encounter (HOSPITAL_BASED_OUTPATIENT_CLINIC_OR_DEPARTMENT_OTHER): Payer: Self-pay

## 2020-05-24 DIAGNOSIS — Z1231 Encounter for screening mammogram for malignant neoplasm of breast: Secondary | ICD-10-CM | POA: Diagnosis not present

## 2020-05-27 NOTE — Progress Notes (Signed)

## 2020-05-31 DIAGNOSIS — H401121 Primary open-angle glaucoma, left eye, mild stage: Secondary | ICD-10-CM | POA: Diagnosis not present

## 2020-06-03 ENCOUNTER — Other Ambulatory Visit: Payer: Self-pay | Admitting: *Deleted

## 2020-06-03 DIAGNOSIS — Z87891 Personal history of nicotine dependence: Secondary | ICD-10-CM

## 2020-06-04 DIAGNOSIS — G4733 Obstructive sleep apnea (adult) (pediatric): Secondary | ICD-10-CM | POA: Diagnosis not present

## 2020-06-22 DIAGNOSIS — M5410 Radiculopathy, site unspecified: Secondary | ICD-10-CM | POA: Diagnosis not present

## 2020-06-28 DIAGNOSIS — H40051 Ocular hypertension, right eye: Secondary | ICD-10-CM | POA: Diagnosis not present

## 2020-06-29 DIAGNOSIS — G4733 Obstructive sleep apnea (adult) (pediatric): Secondary | ICD-10-CM | POA: Diagnosis not present

## 2020-07-05 ENCOUNTER — Other Ambulatory Visit: Payer: Self-pay | Admitting: Internal Medicine

## 2020-07-12 ENCOUNTER — Ambulatory Visit: Payer: Medicare HMO

## 2020-07-13 ENCOUNTER — Ambulatory Visit: Payer: Medicare HMO

## 2020-07-29 DIAGNOSIS — G4733 Obstructive sleep apnea (adult) (pediatric): Secondary | ICD-10-CM | POA: Diagnosis not present

## 2020-08-05 ENCOUNTER — Telehealth: Payer: Self-pay | Admitting: Internal Medicine

## 2020-08-05 ENCOUNTER — Other Ambulatory Visit: Payer: Self-pay

## 2020-08-05 ENCOUNTER — Encounter: Payer: Self-pay | Admitting: Internal Medicine

## 2020-08-05 ENCOUNTER — Telehealth (INDEPENDENT_AMBULATORY_CARE_PROVIDER_SITE_OTHER): Payer: Medicare HMO | Admitting: Internal Medicine

## 2020-08-05 VITALS — BP 121/65 | HR 64 | Temp 97.2°F

## 2020-08-05 DIAGNOSIS — E782 Mixed hyperlipidemia: Secondary | ICD-10-CM

## 2020-08-05 DIAGNOSIS — E1169 Type 2 diabetes mellitus with other specified complication: Secondary | ICD-10-CM | POA: Diagnosis not present

## 2020-08-05 DIAGNOSIS — U071 COVID-19: Secondary | ICD-10-CM | POA: Diagnosis not present

## 2020-08-05 DIAGNOSIS — J0100 Acute maxillary sinusitis, unspecified: Secondary | ICD-10-CM

## 2020-08-05 DIAGNOSIS — Z8709 Personal history of other diseases of the respiratory system: Secondary | ICD-10-CM | POA: Diagnosis not present

## 2020-08-05 MED ORDER — PREDNISONE 10 MG PO TABS: ORAL_TABLET | ORAL | 0 refills | Status: DC

## 2020-08-05 MED ORDER — LEVOFLOXACIN 500 MG PO TABS: 500.0000 mg | ORAL_TABLET | Freq: Every day | ORAL | 0 refills | Status: AC

## 2020-08-05 MED ORDER — ALPRAZOLAM 0.5 MG PO TABS: 0.5000 mg | ORAL_TABLET | Freq: Two times a day (BID) | ORAL | 0 refills | Status: DC | PRN

## 2020-08-05 MED ORDER — HYDROCODONE BIT-HOMATROP MBR 5-1.5 MG/5ML PO SOLN: 5.0000 mL | Freq: Three times a day (TID) | ORAL | 0 refills | Status: DC | PRN

## 2020-08-05 NOTE — Telephone Encounter (Signed)
Faxed Positive Home Covid test result to [facility] [phone]

## 2020-08-05 NOTE — Telephone Encounter (Signed)
Home COVID Test Positive

## 2020-08-05 NOTE — Telephone Encounter (Signed)
Virtual visit 

## 2020-08-05 NOTE — Telephone Encounter (Signed)
Called patient to let her know that Leviquin had been called in because she tolerates it best, and Xanax for sleep and a cough medicine, however she needs to be sure not to take the Xanax and cough medicine together, be sure to space them out.

## 2020-08-05 NOTE — Telephone Encounter (Signed)
scheduled

## 2020-08-05 NOTE — Progress Notes (Signed)
   Subjective:    Patient ID: Dawn Herrera, female    DOB: [birth year], 70 y.o.   MRN: [id number]  HPI 70 year old Female who lost her husband recently unexpectedly of an MI seen today via interactive audio and video telecommunications due to the Coronavirus pandemic.  She has a positive home test today and is calling for advice.   She is identified using 2 identifiers as Dawn Herrera, a patient in this practice.    She is at her home and I am at my office.  She is agreeable to visit in this format today.  Patient has had 3 COVID-19 vaccines but no booster.  Last dose was December 2021.  She thinks she may have been exposed to someone bringing food into the home during her husband's passing.  Otherwise she has had no travel history to explain this.  She has mild symptoms but looks fatigued on video visit.  We discussed Paxlovid therapy but she would prefer something else.  Medical issues including hyperlipidemia (mixed), glaucoma and type 2 diabetes mellitus.  In January hemoglobin A1c was 6.4%.  This has been diet controlled.    Review of Systems patient has mild cough.  She has malaise and fatigue.  Reports low-grade fever 99 degrees, croupy cough, runny nose for over a week.  Initial home COVID test was negative but home COVID test on May 26 was positive.     Objective:   Physical Exam See above.  Reports low-grade fever of 99 degrees.  Looks fatigued.  Does not appear to be short of breath.  Is appropriately sad and grieving.       Assessment & Plan:  Acute COVID-19 virus infection  Bereavement  Type 2 diabetes mellitus  Hyperlipidemia  Acute maxillary sinusitis due to COVID-19 virus infection  History of COPD,  Plan: Patient is a candidate for Paxlovid but would prefer something else so we are going to send in prednisone 10 mg tablets starting with 6 tablets day 1 and decreasing by 10 mg daily i.e. 6-5-4-3-2-1 taper.  Hycodan 1 teaspoon every 8 hours as  needed for cough.  For anxiety and bereavement with Xanax 0.5 mg up to twice daily as needed.  She will continue to watch her diet with history of diabetes and will continue with Crestor 10 mg daily for hyperlipidemia.  She is to call if symptoms worsen.  Time spent with this visit is 20 minutes

## 2020-08-05 NOTE — Telephone Encounter (Signed)
Ezekiel Slocumb [facility]ins [phone]  Bunny called to say she has a low grade fever 99.0, croupy cough, runny nose fatigue for over a week, she did home COVID test week ago that was negative, she is going to do another one and call me back.  She lost her husband 3 weeks ago suddenly to a heart attack.

## 2020-08-05 NOTE — Telephone Encounter (Deleted)
Called patient to let her know that Leviquin had been called in because she tolerates it best, and Xanax for sleep and a cough medicine, however she needs to be sure not to take the Xanax and cough medicine together, be sure to space them out.

## 2020-08-05 NOTE — Telephone Encounter (Signed)
Patient called back to inquire, she thought she was also getting prednisone.

## 2020-08-29 DIAGNOSIS — G4733 Obstructive sleep apnea (adult) (pediatric): Secondary | ICD-10-CM | POA: Diagnosis not present

## 2020-09-03 DIAGNOSIS — H40051 Ocular hypertension, right eye: Secondary | ICD-10-CM | POA: Diagnosis not present

## 2020-09-03 DIAGNOSIS — H401121 Primary open-angle glaucoma, left eye, mild stage: Secondary | ICD-10-CM | POA: Diagnosis not present

## 2020-09-15 ENCOUNTER — Encounter: Payer: Self-pay | Admitting: Internal Medicine

## 2020-09-15 NOTE — Patient Instructions (Addendum)
We are sorry to hear about the passing of your husband.  Take prednisone 10 mg in tapering course starting with 6 tablets day 1 and decrease by 1 tablet daily.  Hycodan has been prescribed for cough to take 1 teaspoon every 8 hours as needed.  For anxiety and bereavement, may take Xanax 0.5 mg up to twice daily as needed.  Continue to monitor Accu-Cheks and continue Crestor for hyperlipidemia.  Call if symptoms worsen or do not improve.Marland Kitchen

## 2020-09-21 ENCOUNTER — Other Ambulatory Visit: Payer: Self-pay

## 2020-09-21 ENCOUNTER — Other Ambulatory Visit: Payer: Medicare HMO | Admitting: Internal Medicine

## 2020-09-21 DIAGNOSIS — Z1329 Encounter for screening for other suspected endocrine disorder: Secondary | ICD-10-CM

## 2020-09-21 DIAGNOSIS — E119 Type 2 diabetes mellitus without complications: Secondary | ICD-10-CM | POA: Diagnosis not present

## 2020-09-21 DIAGNOSIS — E1169 Type 2 diabetes mellitus with other specified complication: Secondary | ICD-10-CM

## 2020-09-21 DIAGNOSIS — E781 Pure hyperglyceridemia: Secondary | ICD-10-CM | POA: Diagnosis not present

## 2020-09-21 DIAGNOSIS — G4733 Obstructive sleep apnea (adult) (pediatric): Secondary | ICD-10-CM | POA: Diagnosis not present

## 2020-09-21 DIAGNOSIS — Z0000 Encounter for general adult medical examination without abnormal findings: Secondary | ICD-10-CM | POA: Diagnosis not present

## 2020-09-21 DIAGNOSIS — E782 Mixed hyperlipidemia: Secondary | ICD-10-CM | POA: Diagnosis not present

## 2020-09-21 DIAGNOSIS — M8580 Other specified disorders of bone density and structure, unspecified site: Secondary | ICD-10-CM | POA: Diagnosis not present

## 2020-09-22 LAB — CBC WITH DIFFERENTIAL/PLATELET
Absolute Monocytes: 504 cells/uL (ref 200–950)
Basophils Absolute: 20 cells/uL (ref 0–200)
Basophils Relative: 0.5 %
Eosinophils Absolute: 132 cells/uL (ref 15–500)
Eosinophils Relative: 3.3 %
HCT: 43.4 % (ref 35.0–45.0)
Hemoglobin: 14.2 g/dL (ref 11.7–15.5)
Lymphs Abs: 1056 cells/uL (ref 850–3900)
MCH: 28.1 pg (ref 27.0–33.0)
MCHC: 32.7 g/dL (ref 32.0–36.0)
MCV: 85.8 fL (ref 80.0–100.0)
MPV: 10.9 fL (ref 7.5–12.5)
Monocytes Relative: 12.6 %
Neutro Abs: 2288 cells/uL (ref 1500–7800)
Neutrophils Relative %: 57.2 %
Platelets: 203 10*3/uL (ref 140–400)
RBC: 5.06 10*6/uL (ref 3.80–5.10)
RDW: 13.7 % (ref 11.0–15.0)
Total Lymphocyte: 26.4 %
WBC: 4 10*3/uL (ref 3.8–10.8)

## 2020-09-22 LAB — HEMOGLOBIN A1C
Hgb A1c MFr Bld: 6.4 % of total Hgb — ABNORMAL HIGH (ref <5.7)
Mean Plasma Glucose: 137 mg/dL
eAG (mmol/L): 7.6 mmol/L

## 2020-09-22 LAB — LIPID PANEL
Cholesterol: 244 mg/dL — ABNORMAL HIGH (ref <200)
HDL: 52 mg/dL (ref >=50)
LDL Cholesterol (Calc): 142 mg/dL (calc) — ABNORMAL HIGH
Non-HDL Cholesterol (Calc): 192 mg/dL (calc) — ABNORMAL HIGH (ref <130)
Total CHOL/HDL Ratio: 4.7 (calc) (ref <5.0)
Triglycerides: 325 mg/dL — ABNORMAL HIGH (ref <150)

## 2020-09-22 LAB — COMPLETE METABOLIC PANEL WITHOUT GFR
AG Ratio: 1.5 (calc) (ref 1.0–2.5)
ALT: 24 U/L (ref 6–29)
AST: 22 U/L (ref 10–35)
Albumin: 4.1 g/dL (ref 3.6–5.1)
Alkaline phosphatase (APISO): 99 U/L (ref 37–153)
BUN: 18 mg/dL (ref 7–25)
CO2: 28 mmol/L (ref 20–32)
Calcium: 9.9 mg/dL (ref 8.6–10.4)
Chloride: 105 mmol/L (ref 98–110)
Creat: 0.96 mg/dL (ref 0.60–1.00)
Globulin: 2.7 g/dL (ref 1.9–3.7)
Glucose, Bld: 129 mg/dL — ABNORMAL HIGH (ref 65–99)
Potassium: 4.5 mmol/L (ref 3.5–5.3)
Sodium: 141 mmol/L (ref 135–146)
Total Bilirubin: 0.5 mg/dL (ref 0.2–1.2)
Total Protein: 6.8 g/dL (ref 6.1–8.1)
eGFR: 63 mL/min/{1.73_m2}

## 2020-09-22 LAB — TSH: TSH: 2.73 mIU/L (ref 0.40–4.50)

## 2020-09-22 LAB — MICROALBUMIN / CREATININE URINE RATIO
Creatinine, Urine: 69 mg/dL (ref 20–275)
Microalb Creat Ratio: 6 ug/mg{creat}
Microalb, Ur: 0.4 mg/dL

## 2020-09-23 ENCOUNTER — Encounter: Payer: Self-pay | Admitting: Internal Medicine

## 2020-09-23 ENCOUNTER — Ambulatory Visit (INDEPENDENT_AMBULATORY_CARE_PROVIDER_SITE_OTHER): Payer: Medicare HMO | Admitting: Internal Medicine

## 2020-09-23 ENCOUNTER — Other Ambulatory Visit: Payer: Self-pay

## 2020-09-23 VITALS — BP 130/80 | HR 62 | Ht 63.0 in | Wt 166.0 lb

## 2020-09-23 DIAGNOSIS — Z6829 Body mass index (BMI) 29.0-29.9, adult: Secondary | ICD-10-CM

## 2020-09-23 DIAGNOSIS — E782 Mixed hyperlipidemia: Secondary | ICD-10-CM

## 2020-09-23 DIAGNOSIS — R7302 Impaired glucose tolerance (oral): Secondary | ICD-10-CM | POA: Diagnosis not present

## 2020-09-23 DIAGNOSIS — Z0000 Encounter for general adult medical examination without abnormal findings: Secondary | ICD-10-CM | POA: Diagnosis not present

## 2020-09-23 DIAGNOSIS — Z8709 Personal history of other diseases of the respiratory system: Secondary | ICD-10-CM | POA: Diagnosis not present

## 2020-09-23 DIAGNOSIS — R002 Palpitations: Secondary | ICD-10-CM

## 2020-09-23 DIAGNOSIS — Z8616 Personal history of COVID-19: Secondary | ICD-10-CM

## 2020-09-23 DIAGNOSIS — I700 Atherosclerosis of aorta: Secondary | ICD-10-CM

## 2020-09-23 DIAGNOSIS — Z87891 Personal history of nicotine dependence: Secondary | ICD-10-CM | POA: Diagnosis not present

## 2020-09-23 DIAGNOSIS — E1169 Type 2 diabetes mellitus with other specified complication: Secondary | ICD-10-CM | POA: Diagnosis not present

## 2020-09-23 DIAGNOSIS — H903 Sensorineural hearing loss, bilateral: Secondary | ICD-10-CM

## 2020-09-23 DIAGNOSIS — Z86010 Personal history of colonic polyps: Secondary | ICD-10-CM

## 2020-09-23 DIAGNOSIS — G4733 Obstructive sleep apnea (adult) (pediatric): Secondary | ICD-10-CM | POA: Diagnosis not present

## 2020-09-23 LAB — POCT URINALYSIS DIPSTICK
Appearance: NEGATIVE
Bilirubin, UA: NEGATIVE
Blood, UA: NEGATIVE
Glucose, UA: NEGATIVE
Ketones, UA: NEGATIVE
Leukocytes, UA: NEGATIVE
Nitrite, UA: NEGATIVE
Odor: NEGATIVE
Protein, UA: NEGATIVE
Spec Grav, UA: 1.02 (ref 1.010–1.025)
Urobilinogen, UA: 0.2 E.U./dL
pH, UA: 6 (ref 5.0–8.0)

## 2020-09-23 NOTE — Progress Notes (Signed)
Subjective:    Patient ID: Dawn Herrera, female    DOB: 1950/11/29, 70 y.o.   MRN: [id number]  HPI 70 year old Female for health maintenance exam, Medicare wellness and evaluation of medical issues.  Husband died 82 recently of a cardiac event. Memorial service was last week in [place]. She has hyperlipidemia and would like to see Dr. Debara Pickett who also sees her brother-in-law.  She has had some issues with palpitations.  EKG and rhythm strip today show no ectopy or concerning changes.  Patient has a history of sleep apnea, hyperlipidemia, impaired glucose tolerance COPD, benign microscopic hematuria, osteopenia, adenomatous polyps.  Recently had COVID-19 that did not require hospitalization and has recovered.  This occurred around the time of her husband's passing when people were bringing food to the home.  Sleep apnea is followed by Dr. Elsworth Soho at [facility]  Issues with positional vertigo October 2015 that responded to Epley maneuver.  Remote history of left plantar fasciitis and is seeing podiatrist at [facility].  In June 2015 she had an episode of epigastric pain and was diagnosed with H. pylori gastritis.  Was treated with 3 drug regimen.  Had endoscopy showing duodenitis and gastritis along with hiatal hernia.  Has seen Dr. Lawerance Bach for microscopic hematuria in 2007 with normal cystoscopy and ureteroscopy.  Saw Dr. Daylene Katayama in 2009 for impingement right shoulder with mild adhesive capsulitis.  History of vitamin D deficiency.  History of bilateral hearing loss and wears hearing aids.   History of adenomatous colon polyp.  She had colonoscopy in 2007 showing 1 small adenomatous polyp in the descending colon.  She had repeat colonoscopy in January 2012 showing hyperplastic polyps by Dr. Michail Sermon at [facility].  No known drug allergies.  Social history: She is working 2 days a week.  2 adult daughters.  Had smoked 1/2 to 1 pack of cigarettes daily for  over 30 years.  Quit smoking about 6 years ago.  Social alcohol consumption.  Family history: Father died at age 56 with congestive heart failure with history of diabetes.  Mother with history of coronary artery disease, MI, status post CABG.  1 brother and 4 sisters in good health.    Review of Systems palpitations without chest pain.  Denies shortness of breath.     Objective:   Physical Exam Blood pressure 130/80 pulse 62 pulse oximetry 97% weight 166 pounds height 5 feet 3 inches BMI 29.41  Skin: Warm and dry.  No cervical adenopathy.  No carotid bruits.  TMs clear.  Neck supple.  No thyromegaly.  Chest clear to auscultation.  Cardiac exam: Regular rate and rhythm.  No murmur appreciated.  Abdomen is soft nondistended without hepatosplenomegaly masses or tenderness.  No lower extremity pitting edema.  Bimanual is normal.  Neuro is intact without focal deficits.  Affect thought and judgment are normal.       Assessment & Plan:   Hyperlipidemia-has not been following a strict low-fat diet since husband passed away.  Total cholesterol has increased from 205 in January to 244.  Triglycerides have increased from 2 22-3 25.  LDL has increased from 1 15-1 42.  HDL is 52.  We talked about increasing Crestor currently 10 mg daily but she wants to see Dr. Debara Pickett for evaluation and treatment ,and I think that is very reasonable.  Grief reaction-due to passing of her husband.  I think she is doing quite well.  History of smoking and  mild COPD.  Quit smoking about 6 years ago  History of sleep apnea treated with CPAP  History of impaired glucose tolerance/diabetes mellitus  History of glucose intolerance.  Has been diet controlled.  Admits that she has not been eating well recently.  In June 2021 hemoglobin A1c was 6.1%.  Has not wanted to be on medication but I think could benefit from medication.  History of glaucoma  Plan: Patient will be referred to Dr. Debara Pickett for evaluation of  palpitations and lipids.  We should recheck hemoglobin A1c in 6 months.  I would like for her to consider medication for glucose intolerance.  She also participates in the CT chest lung cancer screening program at Pulmonary and in March.  Plan: Referral to Dr. Debara Pickett for cardiac evaluation and management of hyperlipidemia.  Her brother-in-law cc him and she would like to see him as well.  She needs to consider medication for impaired glucose tolerance/diabetes.  Has not felt like being on track with diet recently due to passing of husband.  Can reevaluate in 6 months but would like for her to take oral medication.   Subjective:   Patient presents for Medicare Annual/Subsequent preventive examination.  Review Past Medical/Family/Social: See above   Risk Factors  Current exercise habits: Light Dietary issues discussed: Yes  Cardiac risk factors:  Depression Screen  (Note: if answer to either of the following is "Yes", a more complete depression screening is indicated)   Over the past two weeks, have you felt down, depressed or hopeless?  Yes husband passed away Jul 29, 2020 Over the past two weeks, have you felt little interest or pleasure in doing things? No Have you lost interest or pleasure in daily life? No Do you often feel hopeless? No Do you cry easily over simple problems?  Yes since my husband  Activities of Daily Living  In your present state of health, do you have any difficulty performing the following activities?:   Driving? No  Managing money? No  Feeding yourself? No  Getting from bed to chair? No  Climbing a flight of stairs? No  Preparing food and eating?: No  Bathing or showering? No  Getting dressed: No  Getting to the toilet? No  Using the toilet:No  Moving around from place to place: No  In the past year have you fallen or had a near fall?:No  Are you sexually active? No  Do you have more than one partner? No   Hearing Difficulties: Yes wears bilateral  hearing aids Do you often ask people to speak up or repeat themselves?  Yes Do you experience ringing or noises in your ears?  Yes Do you have difficulty understanding soft or whispered voices?  Yes Do you feel that you have a problem with memory? No Do you often misplace items? No    Home Safety:  Do you have a smoke alarm at your residence? Yes Do you have grab bars in the bathroom?  Yes Do you have throw rugs in your house?  Yes:    Cognitive Testing  Alert? Yes Normal Appearance?Yes  Oriented to person? Yes Place? Yes  Time? Yes  Recall of three objects? Yes  Can perform simple calculations? Yes  Displays appropriate judgment?Yes  Can read the correct time from a watch face?Yes   List the Names of Other Physician/Practitioners you currently use:  See referral list for the physicians patient is currently seeing.  Dr. Elsworth Soho   Review of Systems: See above  Objective:     General appearance: Appears stated age and mildly obese  Head: Normocephalic, without obvious abnormality, atraumatic  Eyes: conj clear, EOMi PEERLA  Ears: normal TM's and external ear canals both ears  Nose: Nares normal. Septum midline. Mucosa normal. No drainage or sinus tenderness.  Throat: lips, mucosa, and tongue normal; teeth and gums normal  Neck: no adenopathy, no carotid bruit, no JVD, supple, symmetrical, trachea midline and thyroid not enlarged, symmetric, no tenderness/mass/nodules  No CVA tenderness.  Lungs: clear to auscultation bilaterally  Breasts: normal appearance, no masses or tenderness Heart: regular rate and rhythm, S1, S2 normal, no murmur, click, rub or gallop  Abdomen: soft, non-tender; bowel sounds normal; no masses, no organomegaly  Musculoskeletal: ROM normal in all joints, no crepitus, no deformity, Normal muscle strengthen. Back  is symmetric, no curvature. Skin: Skin color, texture, turgor normal. No rashes or lesions  Lymph nodes: Cervical, supraclavicular, and  axillary nodes normal.  Neurologic: CN 2 -12 Normal, Normal symmetric reflexes. Normal coordination and gait  Psych: Alert & Oriented x 3, Mood appear stable.    Assessment:    Annual wellness medicare exam   Plan:    During the course of the visit the patient was educated and counseled about appropriate screening and preventive services including:  Has had 2 COVID-19 immunizations.  Has had pneumococcal vaccines.      Patient Instructions (the written plan) was given to the patient.  Medicare Attestation  I have personally reviewed:  The patient's medical and social history  Their use of alcohol, tobacco or illicit drugs  Their current medications and supplements  The patient's functional ability including ADLs,fall risks, home safety risks, cognitive, and hearing and visual impairment  Diet and physical activities  Evidence for depression or mood disorders  The patient's weight, height, BMI, and visual acuity have been recorded in the chart. I have made referrals, counseling, and provided education to the patient based on review of the above and I have provided the patient with a written personalized care plan for preventive services.

## 2020-09-23 NOTE — Patient Instructions (Addendum)
Referral to Dr. Debara Pickett regarding palpitations and hyperlipidemia. Patient to return in 6 months.  Need to consider medication for glucose intolerance.  Our staff extends condolences on the loss of her husband.

## 2020-09-24 ENCOUNTER — Encounter: Payer: Self-pay | Admitting: Internal Medicine

## 2020-09-24 DIAGNOSIS — G4733 Obstructive sleep apnea (adult) (pediatric): Secondary | ICD-10-CM | POA: Diagnosis not present

## 2020-10-11 ENCOUNTER — Telehealth: Payer: Self-pay

## 2020-10-11 NOTE — Progress Notes (Signed)
Cardiology Office Note:    Date:  10/13/2020   ID:  Dawn Herrera, DOB 02/15/1951, MRN [id number]  PCP:  Dawn Showers, MD  Cardiologist:  Dawn Dresser, MD  Referring MD: Dawn Showers, MD   CC: new patient evaluation for palpitations  History of Present Illness:    Dawn Herrera is a 70 y.o. female with a hx of depression, hyperlipidemia, prediabetes, and sleep apnea (no CPAP), who is seen as a new consult at the request of Dawn, Cresenciano Lick, MD for the evaluation and management of palpitations.  Prior notes from Dr. Renold Herrera reviewed.  Today: Her main concern today is her episodes of palpitations, which she is noticing 2-3 times a day, with an average duration of 5 minutes. Correlating symptoms include worsening shortness of breath (worse from baseline). She also reports noticing the arrhythmia more when her hands are held above her head. The initial onset of her arrhythmia was when she was 70 yo. However, it has become more bothersome since 03/2020.  Years ago she was on Lipitor, but this was discontinued due to joint pain. Her PCP recommended she take Metformin, but she does not wish to take this. Her most recent A1C was 6.4.   She endorses minor LE edema at times. She denies any chest pain, or exertional symptoms. No headaches, lightheadedness, or syncope to report. Also has no orthopnea or PND.  Previously she was infected with COVID.  Cardiovascular risk factors: Prior clinical ASCVD: none that she is aware of Comorbid conditions, including hypertension, hyperlipidemia, diabetes, chronic kidney disease: hyperlipidemia (dx late 1990's), prediabetes, Metabolic syndrome/Obesity: BMI 30 Chronic inflammatory conditions: none Tobacco use history: former, quir 2014 Family history: Mother had CABG x2 (1st at 95 yo), died at 48 yo. Father had diabetes, died of heart attack at 72 yo. Paternal father died of leukemia. She has 4 sisters, youngest recently started a statin,  otherwise no cardiac issues.   Prior cardiac testing and/or incidental findings on other testing (ie coronary calcium): none Exercise level: She denies ever being limited by her shortness of breath. States she is a very active person, and keeps track of her steps (average 5k-10k). Previously, she also tried youtube exercises. Current diet: Lots of fruits and vegetables, almonds. Dislikes fish.  Past Medical History:  Diagnosis Date   Adenomatous polyp    Allergic rhinitis    Depression    Hyperlipidemia    Prediabetes    Sleep apnea    wears mouth guard    Past Surgical History:  Procedure Laterality Date   COLONOSCOPY  03/21/2010   TUBAL LIGATION  2004    Current Medications: Current Outpatient Medications on File Prior to Visit  Medication Sig   calcium carbonate (OS-CAL) 600 MG TABS Take 600 mg by mouth 2 (two) times daily with a meal.   cholecalciferol (VITAMIN D) 1000 UNITS tablet Take 1,000 Units by mouth daily.   latanoprost (XALATAN) 0.005 % ophthalmic solution    Multiple Vitamins-Minerals (MULTIVITAMIN WITH MINERALS) tablet Take 1 tablet by mouth daily.   nystatin ointment (MYCOSTATIN) Apply 1 application topically 2 (two) times daily. Apply to affected area for up to 7 days.   rosuvastatin (CRESTOR) 10 MG tablet TAKE 1 TABLET BY MOUTH EVERY DAY   triamcinolone (KENALOG) 0.1 % aplly to top of foot 3 times a day until healed   No current facility-administered medications on file prior to visit.     Allergies:   Erythromycin   Social History  Tobacco Use   Smoking status: Former    Packs/day: 1.00    Years: 50.00    Pack years: 50.00    Types: Cigarettes    Quit date: 01/12/2013    Years since quitting: 7.7   Smokeless tobacco: Never  Vaping Use   Vaping Use: Never used  Substance Use Topics   Alcohol use: Yes    Alcohol/week: 1.0 standard drink    Types: 1 Standard drinks or equivalent per week   Drug use: No    Family History: family history  includes Diabetes in an other family member; Heart disease in her father.  ROS:   Please see the history of present illness.  Additional pertinent ROS: Constitutional: Negative for chills, fever, night sweats, unintentional weight loss  HENT: Negative for ear pain and hearing loss.   Eyes: Negative for loss of vision and eye pain.  Respiratory: Positive for shortness of breath. Negative for cough, sputum, wheezing.   Cardiovascular: See HPI. Gastrointestinal: Negative for abdominal pain, melena, and hematochezia.  Genitourinary: Negative for dysuria and hematuria.  Musculoskeletal: Negative for falls and myalgias.  Skin: Negative for itching and rash.  Neurological: Negative for focal weakness, focal sensory changes and loss of consciousness.  Endo/Heme/Allergies: Does not bruise/bleed easily.     EKGs/Labs/Other Studies Reviewed:    The following studies were reviewed today: No prior CV studies available.  EKG:  EKG is personally reviewed.   10/12/2020: NSR at 75 bpm  Recent Labs: 09/21/2020: ALT 24; BUN 18; Creat 0.96; Hemoglobin 14.2; Platelets 203; Potassium 4.5; Sodium 141; TSH 2.73  Recent Lipid Panel    Component Value Date/Time   CHOL 244 (H) 09/21/2020 0929   TRIG 325 (H) 09/21/2020 0929   HDL 52 09/21/2020 0929   CHOLHDL 4.7 09/21/2020 0929   VLDL 48 (H) 09/26/2016 0917   LDLCALC 142 (H) 09/21/2020 0929    Physical Exam:    VS:  BP 118/74   Pulse 75   Ht '5\' 2"'$  (1.575 m)   Wt 164 lb 3.2 oz (74.5 kg)   LMP 03/14/1995 (Approximate)   BMI 30.03 kg/m     Wt Readings from Last 3 Encounters:  10/12/20 164 lb 3.2 oz (74.5 kg)  09/23/20 166 lb (75.3 kg)  03/25/20 168 lb (76.2 kg)    GEN: Well nourished, well developed in no acute distress HEENT: Normal, moist mucous membranes NECK: No JVD CARDIAC: regular rhythm, normal S1 and S2, no rubs or gallops. No murmur. VASCULAR: Radial and DP pulses 2+ bilaterally. No carotid bruits RESPIRATORY:  Clear to auscultation  without rales, wheezing or rhonchi  ABDOMEN: Soft, non-tender, non-distended MUSCULOSKELETAL:  Ambulates independently SKIN: Warm and dry, no edema NEUROLOGIC:  Alert and oriented x 3. No focal neuro deficits noted. PSYCHIATRIC:  Normal affect    ASSESSMENT:    1. Heart palpitations   2. Shortness of breath   3. Mixed hyperlipidemia   4. Cardiac risk counseling   5. Counseling on health promotion and disease prevention    PLAN:    Palpitations Shortness of breath -will order echocardiogram, zio monitor  Mixed hyperlipidemia: -reviewed her trends of lipids. Recent numbers are her worst, but she was visiting family -discussed data on statins, fish oil -discussed diet and exercise changes  Cardiac risk counseling and prevention recommendations: -recommend heart healthy/Mediterranean diet, with whole grains, fruits, vegetable, fish, lean meats, nuts, and olive oil. Limit salt. -recommend moderate walking, 3-5 times/week for 30-50 minutes each session. Aim for at least  150 minutes.week. Goal should be pace of 3 miles/hours, or walking 1.5 miles in 30 minutes -recommend avoidance of tobacco products. Avoid excess alcohol. -ASCVD risk score: The 10-year ASCVD risk score Mikey Bussing DC Brooke Bonito., et al., 2013) is: 19.5%   Values used to calculate the score:     Age: 66 years     Sex: Female     Is Non-Hispanic African American: No     Diabetic: Yes     Tobacco smoker: No     Systolic Blood Pressure: 123456 mmHg     Is BP treated: No     HDL Cholesterol: 52 mg/dL     Total Cholesterol: 244 mg/dL    Plan for follow up: 6 months or sooner as needed.  Dawn Dresser, MD, PhD, [facility]    Medication Adjustments/Labs and Tests Ordered: Current medicines are reviewed at length with the patient today.  Concerns regarding medicines are outlined above.  Orders Placed This Encounter  Procedures   LONG TERM MONITOR (3-14 DAYS)   EKG 12-Lead   ECHOCARDIOGRAM COMPLETE    No orders of the defined types were placed in this encounter.   Patient Instructions  Medication Instructions:  Your Physician recommend you continue on your current medication as directed.    *If you need a refill on your cardiac medications before your next appointment, please call your pharmacy*   Lab Work: None ordered today   Testing/Procedures: Your physician has requested that you have an echocardiogram. Echocardiography is a painless test that uses sound waves to create images of your heart. It provides your doctor with information about the size and shape of your heart and how well your heart's chambers and valves are working. This procedure takes approximately one hour. There are no restrictions for this procedure. [place] [address]  Our physician has recommended that you wear an 7  DAY ZIO-PATCH monitor. The Zio patch cardiac monitor continuously records heart rhythm data for up to 14 days, this is for patients being evaluated for multiple types heart rhythms. For the first 24 hours post application, please avoid getting the Zio monitor wet in the shower or by excessive sweating during exercise. After that, feel free to carry on with regular activities. Keep soaps and lotions away from the ZIO XT Patch.       Follow-Up: At [facility], you and your health needs are our priority.  As part of our continuing mission to provide you with exceptional heart care, we have created designated Provider Care Teams.  These Care Teams include your primary Cardiologist (physician) and Advanced Practice Providers (APPs -  Physician Assistants and Nurse Practitioners) who all work together to provide you with the care you need, when you need it.  We recommend signing up for the patient portal called "MyChart".  Sign up information is provided on this After Visit Summary.  MyChart is used to connect with patients for Virtual Visits (Telemedicine).  Patients are able to view  lab/test results, encounter notes, upcoming appointments, etc.  Non-urgent messages can be sent to your provider as well.   To learn more about what you can do with MyChart, go to [email or web address].    Your next appointment:   6 month(s)  The format for your next appointment:   In Person  Provider:   Buford Dresser, MD     [facility],acting as a scribe for Dawn Dresser, MD.,have documented all relevant documentation on the behalf of Makynlie Rossini  Harrell Gave, MD,as directed by  Dawn Dresser, MD while in the presence of Dawn Dresser, MD.  I, Dawn Dresser, MD, have reviewed all documentation for this visit. The documentation on 10/13/20 for the exam, diagnosis, procedures, and orders are all accurate and complete.   Signed, Dawn Dresser, MD PhD 10/13/2020 12:08 PM    [place]

## 2020-10-11 NOTE — Telephone Encounter (Signed)
Got patient scheduled with DR Christoper for in the morning

## 2020-10-11 NOTE — Telephone Encounter (Signed)
Patient called says her appointment with her cardiologist is not until 10/14 Dr. Debara Pickett. Could you call their office and see if they can work her in sooner?  [phone]

## 2020-10-12 ENCOUNTER — Other Ambulatory Visit: Payer: Self-pay

## 2020-10-12 ENCOUNTER — Ambulatory Visit (HOSPITAL_BASED_OUTPATIENT_CLINIC_OR_DEPARTMENT_OTHER): Payer: Medicare HMO | Admitting: Cardiology

## 2020-10-12 ENCOUNTER — Ambulatory Visit (INDEPENDENT_AMBULATORY_CARE_PROVIDER_SITE_OTHER): Payer: Medicare HMO

## 2020-10-12 VITALS — BP 118/74 | HR 75 | Ht 62.0 in | Wt 164.2 lb

## 2020-10-12 DIAGNOSIS — R002 Palpitations: Secondary | ICD-10-CM

## 2020-10-12 DIAGNOSIS — Z7189 Other specified counseling: Secondary | ICD-10-CM

## 2020-10-12 DIAGNOSIS — E782 Mixed hyperlipidemia: Secondary | ICD-10-CM

## 2020-10-12 DIAGNOSIS — R0602 Shortness of breath: Secondary | ICD-10-CM

## 2020-10-12 NOTE — Patient Instructions (Signed)
Medication Instructions:  Your Physician recommend you continue on your current medication as directed.    *If you need a refill on your cardiac medications before your next appointment, please call your pharmacy*   Lab Work: None ordered today   Testing/Procedures: Your physician has requested that you have an echocardiogram. Echocardiography is a painless test that uses sound waves to create images of your heart. It provides your doctor with information about the size and shape of your heart and how well your heart's chambers and valves are working. This procedure takes approximately one hour. There are no restrictions for this procedure. [place] [address]  Our physician has recommended that you wear an 7  DAY ZIO-PATCH monitor. The Zio patch cardiac monitor continuously records heart rhythm data for up to 14 days, this is for patients being evaluated for multiple types heart rhythms. For the first 24 hours post application, please avoid getting the Zio monitor wet in the shower or by excessive sweating during exercise. After that, feel free to carry on with regular activities. Keep soaps and lotions away from the ZIO XT Patch.       Follow-Up: At [facility], you and your health needs are our priority.  As part of our continuing mission to provide you with exceptional heart care, we have created designated Provider Care Teams.  These Care Teams include your primary Cardiologist (physician) and Advanced Practice Providers (APPs -  Physician Assistants and Nurse Practitioners) who all work together to provide you with the care you need, when you need it.  We recommend signing up for the patient portal called "MyChart".  Sign up information is provided on this After Visit Summary.  MyChart is used to connect with patients for Virtual Visits (Telemedicine).  Patients are able to view lab/test results, encounter notes, upcoming appointments, etc.  Non-urgent messages can be sent  to your provider as well.   To learn more about what you can do with MyChart, go to [email or web address].    Your next appointment:   6 month(s)  The format for your next appointment:   In Person  Provider:   Buford Dresser, MD

## 2020-10-13 ENCOUNTER — Encounter (HOSPITAL_BASED_OUTPATIENT_CLINIC_OR_DEPARTMENT_OTHER): Payer: Self-pay | Admitting: Cardiology

## 2020-10-19 DIAGNOSIS — R002 Palpitations: Secondary | ICD-10-CM | POA: Diagnosis not present

## 2020-10-25 DIAGNOSIS — R002 Palpitations: Secondary | ICD-10-CM | POA: Diagnosis not present

## 2020-10-25 DIAGNOSIS — G4733 Obstructive sleep apnea (adult) (pediatric): Secondary | ICD-10-CM | POA: Diagnosis not present

## 2020-10-28 ENCOUNTER — Other Ambulatory Visit: Payer: Self-pay

## 2020-10-28 ENCOUNTER — Ambulatory Visit (HOSPITAL_COMMUNITY): Payer: Medicare HMO | Attending: Cardiovascular Disease

## 2020-10-28 DIAGNOSIS — R0602 Shortness of breath: Secondary | ICD-10-CM | POA: Diagnosis not present

## 2020-10-28 DIAGNOSIS — R002 Palpitations: Secondary | ICD-10-CM | POA: Insufficient documentation

## 2020-10-28 LAB — ECHOCARDIOGRAM COMPLETE
Area-P 1/2: 3.48 cm2
Calc EF: 58.4 %
S' Lateral: 2.6 cm
Single Plane A2C EF: 61.3 %
Single Plane A4C EF: 57.6 %

## 2020-11-04 DIAGNOSIS — H0102B Squamous blepharitis left eye, upper and lower eyelids: Secondary | ICD-10-CM | POA: Diagnosis not present

## 2020-11-04 DIAGNOSIS — H401131 Primary open-angle glaucoma, bilateral, mild stage: Secondary | ICD-10-CM | POA: Diagnosis not present

## 2020-11-04 DIAGNOSIS — H04123 Dry eye syndrome of bilateral lacrimal glands: Secondary | ICD-10-CM | POA: Diagnosis not present

## 2020-11-05 DIAGNOSIS — R69 Illness, unspecified: Secondary | ICD-10-CM | POA: Diagnosis not present

## 2020-11-16 ENCOUNTER — Encounter (HOSPITAL_BASED_OUTPATIENT_CLINIC_OR_DEPARTMENT_OTHER): Payer: Self-pay

## 2020-11-25 DIAGNOSIS — G4733 Obstructive sleep apnea (adult) (pediatric): Secondary | ICD-10-CM | POA: Diagnosis not present

## 2020-12-17 DIAGNOSIS — G4733 Obstructive sleep apnea (adult) (pediatric): Secondary | ICD-10-CM | POA: Diagnosis not present

## 2020-12-24 ENCOUNTER — Ambulatory Visit: Payer: Medicare HMO | Admitting: Internal Medicine

## 2020-12-31 ENCOUNTER — Other Ambulatory Visit: Payer: Self-pay | Admitting: Internal Medicine

## 2021-01-05 DIAGNOSIS — D229 Melanocytic nevi, unspecified: Secondary | ICD-10-CM | POA: Diagnosis not present

## 2021-01-05 DIAGNOSIS — L814 Other melanin hyperpigmentation: Secondary | ICD-10-CM | POA: Diagnosis not present

## 2021-01-05 DIAGNOSIS — Q809 Congenital ichthyosis, unspecified: Secondary | ICD-10-CM | POA: Diagnosis not present

## 2021-01-05 DIAGNOSIS — L821 Other seborrheic keratosis: Secondary | ICD-10-CM | POA: Diagnosis not present

## 2021-01-05 DIAGNOSIS — L308 Other specified dermatitis: Secondary | ICD-10-CM | POA: Diagnosis not present

## 2021-01-20 DIAGNOSIS — H0102B Squamous blepharitis left eye, upper and lower eyelids: Secondary | ICD-10-CM | POA: Diagnosis not present

## 2021-01-20 DIAGNOSIS — H04123 Dry eye syndrome of bilateral lacrimal glands: Secondary | ICD-10-CM | POA: Diagnosis not present

## 2021-01-20 DIAGNOSIS — H401131 Primary open-angle glaucoma, bilateral, mild stage: Secondary | ICD-10-CM | POA: Diagnosis not present

## 2021-03-11 DIAGNOSIS — G4733 Obstructive sleep apnea (adult) (pediatric): Secondary | ICD-10-CM | POA: Diagnosis not present

## 2021-03-22 ENCOUNTER — Telehealth: Payer: Self-pay | Admitting: Obstetrics and Gynecology

## 2021-03-22 NOTE — Telephone Encounter (Signed)
Left message for patient to call.

## 2021-03-22 NOTE — Telephone Encounter (Signed)
Please contact patient to schedule a follow up appointment.  She came up in my reminder box in Epic.  She has some chronic vulvitis and I had recommended a vulvar biopsy last year.  She did not return for this visit.  If she is still having irritation, I recommend an office visit with me and potential biopsy at that time.

## 2021-03-28 NOTE — Telephone Encounter (Signed)
Encounter reviewed and closed.  

## 2021-03-28 NOTE — Telephone Encounter (Signed)
Dr.Silva please see patient response.

## 2021-03-29 ENCOUNTER — Other Ambulatory Visit: Payer: Medicare HMO | Admitting: Internal Medicine

## 2021-03-29 ENCOUNTER — Other Ambulatory Visit: Payer: Self-pay

## 2021-03-29 DIAGNOSIS — E1169 Type 2 diabetes mellitus with other specified complication: Secondary | ICD-10-CM | POA: Diagnosis not present

## 2021-03-29 DIAGNOSIS — E782 Mixed hyperlipidemia: Secondary | ICD-10-CM | POA: Diagnosis not present

## 2021-03-29 LAB — LIPID PANEL
Cholesterol: 188 mg/dL (ref <200)
HDL: 65 mg/dL (ref >=50)
LDL Cholesterol (Calc): 93 mg/dL (calc)
Non-HDL Cholesterol (Calc): 123 mg/dL (calc) (ref <130)
Total CHOL/HDL Ratio: 2.9 (calc) (ref <5.0)
Triglycerides: 208 mg/dL — ABNORMAL HIGH (ref <150)

## 2021-03-31 ENCOUNTER — Ambulatory Visit (INDEPENDENT_AMBULATORY_CARE_PROVIDER_SITE_OTHER): Payer: Medicare HMO | Admitting: Internal Medicine

## 2021-03-31 ENCOUNTER — Other Ambulatory Visit: Payer: Self-pay

## 2021-03-31 ENCOUNTER — Encounter: Payer: Self-pay | Admitting: Internal Medicine

## 2021-03-31 VITALS — BP 120/70 | HR 73 | Temp 97.3°F | Ht 62.0 in | Wt 167.0 lb

## 2021-03-31 DIAGNOSIS — R7302 Impaired glucose tolerance (oral): Secondary | ICD-10-CM | POA: Diagnosis not present

## 2021-03-31 DIAGNOSIS — E1169 Type 2 diabetes mellitus with other specified complication: Secondary | ICD-10-CM | POA: Diagnosis not present

## 2021-03-31 DIAGNOSIS — G4733 Obstructive sleep apnea (adult) (pediatric): Secondary | ICD-10-CM | POA: Diagnosis not present

## 2021-03-31 DIAGNOSIS — H903 Sensorineural hearing loss, bilateral: Secondary | ICD-10-CM | POA: Diagnosis not present

## 2021-03-31 DIAGNOSIS — E782 Mixed hyperlipidemia: Secondary | ICD-10-CM | POA: Diagnosis not present

## 2021-03-31 MED ORDER — ROSUVASTATIN CALCIUM 20 MG PO TABS: 20.0000 mg | ORAL_TABLET | Freq: Every day | ORAL | 3 refills | Status: DC

## 2021-03-31 NOTE — Progress Notes (Signed)
° °  Subjective:    Patient ID: Dawn Herrera, female    DOB: 1950-05-16, 71 y.o.   MRN: [id number]  HPI 71 year old Female recently widowed in the Summer 2022 seen today for 39-month recheck.  She has a history of sleep apnea, hyperlipidemia, impaired glucose tolerance, COPD, benign microscopic hematuria, osteopenia and adenomatous polyps.  She had COVID-19 in 2022 that did not require hospitalization.  History of sleep apnea seen by Dr. Elsworth Soho at [facility].  She saw Dr. Harrell Gave for heart palpitations in August 2022.  Echocardiogram and ZIO monitor ordered.  ZIO monitor was essentially normal.  2D echo was within normal limits.  In July total cholesterol was 244 and triglycerides were 325 with an LDL cholesterol of 142.  She had been taking rosuvastatin 10 mg daily for several years.  Although her lipids have improved considerably with statin I still would like to see her triglycerides lower.  They currently are 208.  I want her to take Crestor 20 mg daily.  She will follow-up here in July.  Have ordered coronary calcium scoring.  She also had CT of the chest lung cancer screening by pulmonary in March 2022.  Hemoglobin A1c is 6.7% and previously was 6.4% in July 2022.  She does not want to be on medication for impaired glucose tolerance.    Review of Systems see above-Christmas was hard for her as she had lost her husband this past summer.     Objective:   Physical Exam  Blood pressure 120/70 pulse 73 pulse oximetry 96% weight 167 pounds BMI 30.54  No thyromegaly.  No carotid bruits.  Chest is clear.  Cardiac exam: Regular rate and rhythm without ectopy.  No lower extremity pitting edema.      Assessment & Plan:  Hyperlipidemia-have increase Crestor to 20 mg daily.  Triglycerides are 208, total cholesterol 188, HDL 65 and LDL cholesterol 93.  Have ordered coronary calcium scoring.  Impaired glucose tolerance-she refuses to be on glucose lowering medication at this time.   Continue with diet and exercise regimen and follow-up in 6 months.  Hemoglobin A1c is 6.7%  Anxiety and grief-doing well since husband passed away.  Holidays were difficult.  Palpitations-seen by Dr. Harrell Gave and has follow-up with her soon.  History of glaucoma  Plan: Continue diet and exercise regimen for impaired glucose tolerance.  Patient does not want to be on medication for this.  Have coronary calcium scoring done.  Have increased dose of statin.  We will follow-up with fasting lipid panel and liver functions in 6 months as well as hemoglobin A1c.  Follow-up with Dr. Harrell Gave as planned.

## 2021-03-31 NOTE — Patient Instructions (Addendum)
Patient declines medication for impaired glucose tolerance.  Increase Crestor to 20 mg daily and have coronary calcium scoring done.  Follow-up as planned with Dr. Harrell Gave.  Return here in 6 months for Medicare wellness and health maintenance exam with fasting labs.

## 2021-04-01 LAB — HEMOGLOBIN A1C
Hgb A1c MFr Bld: 6.7 % of total Hgb — ABNORMAL HIGH (ref <5.7)
Mean Plasma Glucose: 146 mg/dL
eAG (mmol/L): 8.1 mmol/L

## 2021-04-06 ENCOUNTER — Other Ambulatory Visit: Payer: Self-pay

## 2021-04-06 ENCOUNTER — Ambulatory Visit (HOSPITAL_BASED_OUTPATIENT_CLINIC_OR_DEPARTMENT_OTHER)
Admission: RE | Admit: 2021-04-06 | Discharge: 2021-04-06 | Disposition: A | Discharge status: Home / Self Care | Payer: Medicare HMO | Source: Ambulatory Visit | Attending: Internal Medicine | Admitting: Internal Medicine

## 2021-04-14 ENCOUNTER — Encounter (HOSPITAL_BASED_OUTPATIENT_CLINIC_OR_DEPARTMENT_OTHER): Payer: Self-pay | Admitting: Cardiology

## 2021-04-14 ENCOUNTER — Other Ambulatory Visit: Payer: Self-pay

## 2021-04-14 ENCOUNTER — Ambulatory Visit (HOSPITAL_BASED_OUTPATIENT_CLINIC_OR_DEPARTMENT_OTHER): Payer: Medicare HMO | Admitting: Cardiology

## 2021-04-14 VITALS — BP 110/68 | HR 89 | Ht 62.0 in | Wt 168.3 lb

## 2021-04-14 DIAGNOSIS — R002 Palpitations: Secondary | ICD-10-CM | POA: Diagnosis not present

## 2021-04-14 DIAGNOSIS — Z7189 Other specified counseling: Secondary | ICD-10-CM

## 2021-04-14 DIAGNOSIS — I2510 Atherosclerotic heart disease of native coronary artery without angina pectoris: Secondary | ICD-10-CM | POA: Diagnosis not present

## 2021-04-14 DIAGNOSIS — E782 Mixed hyperlipidemia: Secondary | ICD-10-CM

## 2021-04-14 DIAGNOSIS — Z712 Person consulting for explanation of examination or test findings: Secondary | ICD-10-CM

## 2021-04-14 DIAGNOSIS — E119 Type 2 diabetes mellitus without complications: Secondary | ICD-10-CM | POA: Insufficient documentation

## 2021-04-14 MED ORDER — ASPIRIN EC 81 MG PO TBEC: 81.0000 mg | DELAYED_RELEASE_TABLET | Freq: Every day | ORAL | 3 refills | Status: AC

## 2021-04-14 NOTE — Patient Instructions (Signed)
Medication Instructions:  Your physician has recommended you make the following change in your medication:  Start: Aspirin 81mg    *If you need a refill on your cardiac medications before your next appointment, please call your pharmacy*   Lab Work: None today  If you have labs (blood work) drawn today and your tests are completely normal, you will receive your results only by: [place] (if you have MyChart) OR A paper copy in the mail If you have any lab test that is abnormal or we need to change your treatment, we will call you to review the results.   Testing/Procedures: None today    Follow-Up: At [facility], you and your health needs are our priority.  As part of our continuing mission to provide you with exceptional heart care, we have created designated Provider Care Teams.  These Care Teams include your primary Cardiologist (physician) and Advanced Practice Providers (APPs -  Physician Assistants and Nurse Practitioners) who all work together to provide you with the care you need, when you need it.  We recommend signing up for the patient portal called "MyChart".  Sign up information is provided on this After Visit Summary.  MyChart is used to connect with patients for Virtual Visits (Telemedicine).  Patients are able to view lab/test results, encounter notes, upcoming appointments, etc.  Non-urgent messages can be sent to your provider as well.   To learn more about what you can do with MyChart, go to [email or web address].    Your next appointment:   1 year(s)  The format for your next appointment:   In Person  Provider:   Buford Dresser, MD{

## 2021-04-14 NOTE — Progress Notes (Signed)
Cardiology Office Note:    Date:  04/14/2021   ID:  Dawn Herrera, DOB 1950/09/29, MRN [id number]  PCP:  Elby Showers, MD  Cardiologist:  Buford Dresser, MD  Referring MD: Elby Showers, MD   CC: follow up  History of Present Illness:    Dawn Herrera is a 71 y.o. female with a hx of depression, hyperlipidemia, prediabetes, and sleep apnea (no CPAP), who is seen for follow up today. I initially met her 10/12/20 as a new consult at the request of Baxley, Cresenciano Lick, MD for the evaluation and management of palpitations.  Muscles aches on atorvastatin Tobacco use history: former, quit 2014 Family history: Mother had CABG x2 (1st at 29 yo), died at 65 yo. Father had diabetes, died of heart attack at 73 yo. Paternal father died of leukemia. She has 4 sisters, youngest recently started a statin, otherwise no cardiac issues.   Exercise level: She denies ever being limited by her shortness of breath. States she is a very active person, and keeps track of her steps (average 5k-10k). Previously, she also tried youtube exercises. Current diet: Lots of fruits and vegetables, almonds. Dislikes fish.  Today: Still coping with the loss of her husband.  We reviewed her calcium score today, score of 7.63, 50th percentile. She was recommended to increase her rosuvastatin to 20 mg and start metformin. She does not want these changes. We discussed recommendations and guidelines today. She is concerned between the link between diabetes and statins. We discussed this at length today. We discussed that she formally has diabetes based on 6.7 A1c 03/31/21. She monitors her sugars at home. We discussed lifestyle today.   We reviewed her actual images together today. We also discussed the natural history of plaque and role of statins and aspirin.  Rare palpitations, manageable. Reviewed prior monitor results.  Denies chest pain, shortness of breath at rest or with normal exertion. No PND, orthopnea,  LE edema or unexpected weight gain. No syncope.   Past Medical History:  Diagnosis Date   Adenomatous polyp    Allergic rhinitis    Depression    Hyperlipidemia    Prediabetes    Sleep apnea    wears mouth guard    Past Surgical History:  Procedure Laterality Date   COLONOSCOPY  03/21/2010   TUBAL LIGATION  2004    Current Medications: Current Outpatient Medications on File Prior to Visit  Medication Sig   calcium carbonate (OS-CAL) 600 MG TABS Take 600 mg by mouth 2 (two) times daily with a meal.   cholecalciferol (VITAMIN D) 1000 UNITS tablet Take 1,000 Units by mouth daily.   clobetasol ointment (TEMOVATE) 0.05 % Apply topically as needed.   mometasone (ELOCON) 0.1 % cream Apply topically as needed.   Multiple Vitamins-Minerals (MULTIVITAMIN WITH MINERALS) tablet Take 1 tablet by mouth daily.   rosuvastatin (CRESTOR) 20 MG tablet Take 1 tablet (20 mg total) by mouth daily.   No current facility-administered medications on file prior to visit.     Allergies:   Erythromycin   Social History   Tobacco Use   Smoking status: Former    Packs/day: 1.00    Years: 50.00    Pack years: 50.00    Types: Cigarettes    Quit date: 01/12/2013    Years since quitting: 8.2   Smokeless tobacco: Never  Vaping Use   Vaping Use: Never used  Substance Use Topics   Alcohol use: Yes    Alcohol/week: 1.0  standard drink    Types: 1 Standard drinks or equivalent per week   Drug use: No    Family History: family history includes Diabetes in an other family member; Heart disease in her father.  ROS:   Please see the history of present illness.  Additional pertinent ROS otherwise unremarkable.  EKGs/Labs/Other Studies Reviewed:    The following studies were reviewed today: Calcium score 04/06/21 Coronary Calcium Score:   Left main: 0   Left anterior descending artery: 0   Left circumflex artery: 3.97   Right coronary artery: 3.66   Total: 7.63   Percentile: 50    Pericardium: Normal.   Ascending Aorta: Normal caliber.   Non-cardiac: See separate report from [facility].   IMPRESSION: Coronary calcium score of 7.63. This was 46 percentile for age-, race-, and sex-matched controls.  EKG:  EKG is personally reviewed.   10/12/2020: NSR at 75 bpm  Recent Labs: 09/21/2020: ALT 24; BUN 18; Creat 0.96; Hemoglobin 14.2; Platelets 203; Potassium 4.5; Sodium 141; TSH 2.73  Recent Lipid Panel    Component Value Date/Time   CHOL 188 03/29/2021 0917   TRIG 208 (H) 03/29/2021 0917   HDL 65 03/29/2021 0917   CHOLHDL 2.9 03/29/2021 0917   VLDL 48 (H) 09/26/2016 0917   LDLCALC 93 03/29/2021 0917    Physical Exam:    VS:  BP 110/68 (BP Location: Right Arm, Patient Position: Sitting, Cuff Size: Normal)    Pulse 89    Ht '5\' 2"'  (1.575 m)    Wt 168 lb 4.8 oz (76.3 kg)    LMP 03/14/1995 (Approximate)    SpO2 96%    BMI 30.78 kg/m     Wt Readings from Last 3 Encounters:  04/14/21 168 lb 4.8 oz (76.3 kg)  03/31/21 167 lb (75.8 kg)  10/12/20 164 lb 3.2 oz (74.5 kg)    GEN: Well nourished, well developed in no acute distress HEENT: Normal, moist mucous membranes NECK: No JVD CARDIAC: regular rhythm, normal S1 and S2, no rubs or gallops. No murmur. VASCULAR: Radial and DP pulses 2+ bilaterally. No carotid bruits RESPIRATORY:  Clear to auscultation without rales, wheezing or rhonchi  ABDOMEN: Soft, non-tender, non-distended MUSCULOSKELETAL:  Ambulates independently SKIN: Warm and dry, no edema NEUROLOGIC:  Alert and oriented x 3. No focal neuro deficits noted. PSYCHIATRIC:  Normal affect    ASSESSMENT:    1. Coronary artery calcification seen on CT scan   2. Nonocclusive coronary atherosclerosis of native coronary artery   3. Type 2 diabetes mellitus without complication, without long-term current use of insulin ([place])   4. Encounter to discuss test results   5. Cardiac risk counseling   6. Counseling on health promotion and disease prevention    7. Mixed hyperlipidemia   8. Heart palpitations     PLAN:    Coronary calcification Aortic atherosclerosis Nonobstructive CAD Mixed hyperlipidemia: -we reviewed her tests at length today -with finding of coronary calcium, recommend LDL <70 -she has not started the 20 mg dose of rosuvastatin, will start today -discussed aspirin, she is willing to start -counseled on red flag warning signs that need immediate medical attention  Type II diabetes -A1c 6.7 -wants to work on diet and exercise to control her sugar, wishes to avoid metformin.  Cardiac risk counseling and prevention recommendations: -recommend heart healthy/Mediterranean diet, with whole grains, fruits, vegetable, fish, lean meats, nuts, and olive oil. Limit salt. -recommend moderate walking, 3-5 times/week for 30-50 minutes each session. Aim for at  least 150 minutes.week. Goal should be pace of 3 miles/hours, or walking 1.5 miles in 30 minutes -recommend avoidance of tobacco products. Avoid excess alcohol. -ASCVD risk score: The 10-year ASCVD risk score (Arnett DK, et al., 2019) is: 16%   Values used to calculate the score:     Age: 48 years     Sex: Female     Is Non-Hispanic African American: No     Diabetic: Yes     Tobacco smoker: No     Systolic Blood Pressure: 115 mmHg     Is BP treated: No     HDL Cholesterol: 65 mg/dL     Total Cholesterol: 188 mg/dL    Plan for follow up: 1 year or sooner as needed.  Buford Dresser, MD, PhD, [facility]    Medication Adjustments/Labs and Tests Ordered: Current medicines are reviewed at length with the patient today.  Concerns regarding medicines are outlined above.  No orders of the defined types were placed in this encounter.  Meds ordered this encounter  Medications   aspirin EC 81 MG tablet    Sig: Take 1 tablet (81 mg total) by mouth daily. Swallow whole.    Dispense:  90 tablet    Refill:  3   Patient Instructions  Medication  Instructions:  Your physician has recommended you make the following change in your medication:  Start: Aspirin 61m   *If you need a refill on your cardiac medications before your next appointment, please call your pharmacy*   Lab Work: None today  If you have labs (blood work) drawn today and your tests are completely normal, you will receive your results only by: MDortches(if you have MyChart) OR A paper copy in the mail If you have any lab test that is abnormal or we need to change your treatment, we will call you to review the results.   Testing/Procedures: None today    Follow-Up: [facility] our [facility].  As part of our continuing mission to provide you with exceptional heart care, we have created designated Provider Care Teams.  These Care Teams include your primary Cardiologist (physician) and Advanced Practice Providers (APPs -  Physician Assistants and Nurse Practitioners) who all work together to provide you with the care you need, when you need it.  We recommend signing up for the patient portal called "MyChart".  Sign up information is provided on this After Visit Summary.  MyChart is used to connect with patients for Virtual Visits (Telemedicine).  Patients are able to view lab/test results, encounter notes, upcoming appointments, etc.  Non-urgent messages can be sent to your provider as well.   To learn more about what you can do with MyChart, go to [email or web address]    Your next appointment:   1 year(s)  The format for your next appointment:   In Person  Provider:   BBuford Dresser MD{      Signed, BBuford Dresser MD PhD 04/14/2021 7:08 PM    [place]

## 2021-04-18 DIAGNOSIS — L821 Other seborrheic keratosis: Secondary | ICD-10-CM | POA: Diagnosis not present

## 2021-04-18 DIAGNOSIS — Q809 Congenital ichthyosis, unspecified: Secondary | ICD-10-CM | POA: Diagnosis not present

## 2021-05-02 ENCOUNTER — Telehealth: Payer: Self-pay | Admitting: Pulmonary Disease

## 2021-05-02 NOTE — Telephone Encounter (Signed)
Called patient and she is wanting information on the [place] device.   Please advise Dr Elsworth Soho

## 2021-05-04 NOTE — Telephone Encounter (Signed)
Called and left message for patient to call office back and schedule an appointment with Dr Elsworth Soho to discuss the inspire device.

## 2021-05-05 DIAGNOSIS — H04123 Dry eye syndrome of bilateral lacrimal glands: Secondary | ICD-10-CM | POA: Diagnosis not present

## 2021-05-05 DIAGNOSIS — H401131 Primary open-angle glaucoma, bilateral, mild stage: Secondary | ICD-10-CM | POA: Diagnosis not present

## 2021-05-05 DIAGNOSIS — H5213 Myopia, bilateral: Secondary | ICD-10-CM | POA: Diagnosis not present

## 2021-05-05 DIAGNOSIS — E1165 Type 2 diabetes mellitus with hyperglycemia: Secondary | ICD-10-CM | POA: Diagnosis not present

## 2021-05-05 DIAGNOSIS — H2513 Age-related nuclear cataract, bilateral: Secondary | ICD-10-CM | POA: Diagnosis not present

## 2021-05-05 LAB — HM DIABETES EYE EXAM

## 2021-05-06 NOTE — Telephone Encounter (Signed)
Office visit with dr Elsworth Soho next month on the 21st to discuss Inspire device. Nothing further needed

## 2021-05-11 DIAGNOSIS — Z0100 Encounter for examination of eyes and vision without abnormal findings: Secondary | ICD-10-CM | POA: Diagnosis not present

## 2021-05-31 ENCOUNTER — Encounter: Payer: Self-pay | Admitting: Pulmonary Disease

## 2021-05-31 ENCOUNTER — Ambulatory Visit: Payer: Medicare HMO | Admitting: Pulmonary Disease

## 2021-05-31 ENCOUNTER — Other Ambulatory Visit: Payer: Self-pay

## 2021-05-31 VITALS — BP 118/64 | HR 71 | Temp 98.6°F | Ht 63.0 in | Wt 167.8 lb

## 2021-05-31 DIAGNOSIS — G4733 Obstructive sleep apnea (adult) (pediatric): Secondary | ICD-10-CM | POA: Diagnosis not present

## 2021-05-31 NOTE — Progress Notes (Signed)
? ?Subjective:  ? ? Patient ID: Dawn Herrera [facility], female    DOB: Apr 19, 1950, 71 y.o.   MRN: [id number] ? ?HPI ? 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  Initially treated with dental appliance but then switched to CPAP.  Her last office visit with me was in 06/2017, we set her up with new CPAP device at 11 cm.  She has done well on this device. ?She did not have symptoms of excessive daytime somnolence but she presented mainly due to loud snoring noted by her husband. ?Her husband has since passed in 2022 and she is still grieving. ?She presents to be assessed and get a second opinion about the inspire implant. ?She states that although she is tolerant of CPAP, this causes dry eyes and she has never found a good fitting mask.  She would also like to avoid the nuisance of having to take a CPAP when she travels. ?Epworth Sleepiness Scale is 5 and she denies daytime somnolence and fatigue. ?She takes an occasional nap in the afternoon and this is refreshing. ?Bedtime can be around 11 PM to midnight, sleep latency is minimal, she reports 1-2 nocturnal awakenings and that she is out of bed at 8 AM feeling rested without dryness of mouth or headaches. ? ?Weight is mostly unchanged around 167 pounds ?There is no history suggestive of cataplexy, sleep paralysis or parasomnias ? ? ?CPAP download was reviewed which shows excellent control of events of 11 cm with minimal leak and good compliance more than 8 hours every night ? ?Significant tests/ events reviewed ? ?NPSG 01/2011:  AHI 25/hr ? ?Past Medical History:  ?Diagnosis Date  ? Adenomatous polyp   ? Allergic rhinitis   ? Depression   ? Hyperlipidemia   ? Prediabetes   ? Sleep apnea   ? wears mouth guard  ? ? ?Past Surgical History:  ?Procedure Laterality Date  ? COLONOSCOPY  03/21/2010  ? TUBAL LIGATION  2004  ? ?Allergies  ?Allergen Reactions  ? Erythromycin Nausea Only  ? ? ?Social History  ? ?Socioeconomic History  ?  Marital status: Widowed  ?  Spouse name: Sonia Side  ? Number of children: Y  ? Years of education: Not on file  ? Highest education level: Not on file  ?Occupational History  ? Occupation: PRE-CERTIFICATION  ?  Employer: [employer]  ?  Comment: [employer]  ?Tobacco Use  ? Smoking status: Former  ?  Packs/day: 1.00  ?  Years: 50.00  ?  Pack years: 50.00  ?  Types: Cigarettes  ?  Quit date: 01/12/2013  ?  Years since quitting: 8.3  ? Smokeless tobacco: Never  ?Vaping Use  ? Vaping Use: Never used  ?Substance and Sexual Activity  ? Alcohol use: Yes  ?  Alcohol/week: 1.0 standard drink  ?  Types: 1 Standard drinks or equivalent per week  ? Drug use: No  ? Sexual activity: Not Currently  ?Other Topics Concern  ? Not on file  ?Social History Narrative  ? Not on file  ? ?Social Determinants of Health  ? ?Financial Resource Strain: Not on file  ?Food Insecurity: Not on file  ?Transportation Needs: Not on file  ?Physical Activity: Not on file  ?Stress: Not on file  ?Social Connections: Not on file  ?Intimate Partner Violence: Not on file  ? ? ?Family History  ?Problem Relation Age of Onset  ? Heart disease Father   ?  Diabetes Other   ? ? ? ? ?Review of Systems ?Constitutional: negative for anorexia, fevers and sweats  ?Eyes: negative for irritation, redness and visual disturbance  ?Ears, nose, mouth, throat, and face: negative for earaches, epistaxis, nasal congestion and sore throat  ?Respiratory: negative for cough, dyspnea on exertion, sputum and wheezing  ?Cardiovascular: negative for chest pain, dyspnea, lower extremity edema, orthopnea, palpitations and syncope  ?Gastrointestinal: negative for abdominal pain, constipation, diarrhea, melena, nausea and vomiting  ?Genitourinary:negative for dysuria, frequency and hematuria  ?Hematologic/lymphatic: negative for bleeding, easy bruising and lymphadenopathy  ?Musculoskeletal:negative for arthralgias, muscle weakness and stiff joints  ?Neurological:  negative for coordination problems, gait problems, headaches and weakness  ?Endocrine: negative for diabetic symptoms including polydipsia, polyuria and weight loss ? ?   ?Objective:  ? Physical Exam ? ? ?Gen. Pleasant, well-nourished, in no distress, normal affect ?ENT - no pallor,icterus, no post nasal drip ?Neck: No JVD, no thyromegaly, no carotid bruits ?Lungs: no use of accessory muscles, no dullness to percussion, clear without rales or rhonchi  ?Cardiovascular: Rhythm regular, heart sounds  normal, no murmurs or gallops, no peripheral edema ?Abdomen: soft and non-tender, no hepatosplenomegaly, BS normal. ?Musculoskeletal: No deformities, no cyanosis or clubbing ?Neuro:  alert, non focal ? ? ? ? ?   ?Assessment & Plan:  ? ? ?

## 2021-05-31 NOTE — Patient Instructions (Signed)
?  X Home sleep study ? ? ?

## 2021-05-31 NOTE — Assessment & Plan Note (Signed)
She is very compliant with CPAP.  CPAP is controlled events and reduced AHI and certainly helped improve her daytime somnolence and fatigue. ?She reports difficulty tolerating in terms of finding a good fitting mask and would like to explore the option of hypoglossal nerve stimulation.  We discussed pros and cons of this therapy.  In the past she has also tried dental appliance without benefit. ?I think it is reasonable to explore if she is a candidate.  BMI seems to be acceptable.  She has appointment scheduled with an ENT physician. ?Her sleep study was in 2012 and we will reassess with a home sleep test ?

## 2021-06-03 DIAGNOSIS — G4733 Obstructive sleep apnea (adult) (pediatric): Secondary | ICD-10-CM | POA: Diagnosis not present

## 2021-06-03 DIAGNOSIS — Z9989 Dependence on other enabling machines and devices: Secondary | ICD-10-CM | POA: Diagnosis not present

## 2021-06-08 ENCOUNTER — Ambulatory Visit (INDEPENDENT_AMBULATORY_CARE_PROVIDER_SITE_OTHER): Payer: Medicare HMO | Admitting: Family Medicine

## 2021-06-09 DIAGNOSIS — G4733 Obstructive sleep apnea (adult) (pediatric): Secondary | ICD-10-CM | POA: Diagnosis not present

## 2021-06-22 ENCOUNTER — Ambulatory Visit (INDEPENDENT_AMBULATORY_CARE_PROVIDER_SITE_OTHER): Payer: Medicare HMO | Admitting: Family Medicine

## 2021-07-01 ENCOUNTER — Encounter: Payer: Self-pay | Admitting: Pulmonary Disease

## 2021-07-07 ENCOUNTER — Telehealth: Payer: Self-pay

## 2021-07-07 DIAGNOSIS — H9313 Tinnitus, bilateral: Secondary | ICD-10-CM | POA: Diagnosis not present

## 2021-07-07 DIAGNOSIS — Z822 Family history of deafness and hearing loss: Secondary | ICD-10-CM | POA: Diagnosis not present

## 2021-07-07 DIAGNOSIS — H903 Sensorineural hearing loss, bilateral: Secondary | ICD-10-CM | POA: Diagnosis not present

## 2021-07-07 DIAGNOSIS — E119 Type 2 diabetes mellitus without complications: Secondary | ICD-10-CM | POA: Diagnosis not present

## 2021-07-07 DIAGNOSIS — Z77122 Contact with and (suspected) exposure to noise: Secondary | ICD-10-CM | POA: Diagnosis not present

## 2021-07-07 NOTE — Telephone Encounter (Signed)
Spoke with patient to schedule annual LDCT. She is at an appt and unable to schedule.  Asked for a call back tomorrow ?

## 2021-07-08 ENCOUNTER — Other Ambulatory Visit: Payer: Self-pay

## 2021-07-08 DIAGNOSIS — Z122 Encounter for screening for malignant neoplasm of respiratory organs: Secondary | ICD-10-CM

## 2021-07-08 DIAGNOSIS — Z87891 Personal history of nicotine dependence: Secondary | ICD-10-CM

## 2021-07-19 ENCOUNTER — Ambulatory Visit (HOSPITAL_BASED_OUTPATIENT_CLINIC_OR_DEPARTMENT_OTHER)
Admission: RE | Admit: 2021-07-19 | Discharge: 2021-07-19 | Disposition: A | Discharge status: Home / Self Care | Payer: Medicare HMO | Source: Ambulatory Visit | Attending: Acute Care | Admitting: Acute Care

## 2021-07-19 ENCOUNTER — Ambulatory Visit (HOSPITAL_BASED_OUTPATIENT_CLINIC_OR_DEPARTMENT_OTHER): Payer: Medicare HMO

## 2021-07-19 DIAGNOSIS — Z122 Encounter for screening for malignant neoplasm of respiratory organs: Secondary | ICD-10-CM | POA: Insufficient documentation

## 2021-07-19 DIAGNOSIS — G4730 Sleep apnea, unspecified: Secondary | ICD-10-CM | POA: Diagnosis not present

## 2021-07-19 DIAGNOSIS — Z87891 Personal history of nicotine dependence: Secondary | ICD-10-CM | POA: Insufficient documentation

## 2021-07-21 ENCOUNTER — Other Ambulatory Visit: Payer: Self-pay

## 2021-07-21 DIAGNOSIS — Z87891 Personal history of nicotine dependence: Secondary | ICD-10-CM

## 2021-07-21 DIAGNOSIS — Z122 Encounter for screening for malignant neoplasm of respiratory organs: Secondary | ICD-10-CM

## 2021-08-31 DIAGNOSIS — Z9989 Dependence on other enabling machines and devices: Secondary | ICD-10-CM | POA: Diagnosis not present

## 2021-08-31 DIAGNOSIS — G4733 Obstructive sleep apnea (adult) (pediatric): Secondary | ICD-10-CM | POA: Diagnosis not present

## 2021-09-02 DIAGNOSIS — H04123 Dry eye syndrome of bilateral lacrimal glands: Secondary | ICD-10-CM | POA: Diagnosis not present

## 2021-09-02 DIAGNOSIS — H401131 Primary open-angle glaucoma, bilateral, mild stage: Secondary | ICD-10-CM | POA: Diagnosis not present

## 2021-09-02 DIAGNOSIS — H2513 Age-related nuclear cataract, bilateral: Secondary | ICD-10-CM | POA: Diagnosis not present

## 2021-09-07 DIAGNOSIS — G4733 Obstructive sleep apnea (adult) (pediatric): Secondary | ICD-10-CM | POA: Diagnosis not present

## 2021-09-22 ENCOUNTER — Other Ambulatory Visit: Payer: Medicare HMO

## 2021-09-22 DIAGNOSIS — R5383 Other fatigue: Secondary | ICD-10-CM | POA: Diagnosis not present

## 2021-09-22 DIAGNOSIS — E1169 Type 2 diabetes mellitus with other specified complication: Secondary | ICD-10-CM

## 2021-09-22 DIAGNOSIS — E559 Vitamin D deficiency, unspecified: Secondary | ICD-10-CM | POA: Diagnosis not present

## 2021-09-22 DIAGNOSIS — E782 Mixed hyperlipidemia: Secondary | ICD-10-CM | POA: Diagnosis not present

## 2021-09-23 ENCOUNTER — Other Ambulatory Visit: Payer: Medicare HMO

## 2021-09-23 LAB — COMPLETE METABOLIC PANEL WITH GFR
AG Ratio: 1.6 (calc) (ref 1.0–2.5)
ALT: 23 U/L (ref 6–29)
AST: 21 U/L (ref 10–35)
Albumin: 4.3 g/dL (ref 3.6–5.1)
Alkaline phosphatase (APISO): 101 U/L (ref 37–153)
BUN: 14 mg/dL (ref 7–25)
CO2: 28 mmol/L (ref 20–32)
Calcium: 10.1 mg/dL (ref 8.6–10.4)
Chloride: 105 mmol/L (ref 98–110)
Creat: 1 mg/dL (ref 0.60–1.00)
Globulin: 2.7 g/dL (calc) (ref 1.9–3.7)
Glucose, Bld: 128 mg/dL — ABNORMAL HIGH (ref 65–99)
Potassium: 4.3 mmol/L (ref 3.5–5.3)
Sodium: 141 mmol/L (ref 135–146)
Total Bilirubin: 0.4 mg/dL (ref 0.2–1.2)
Total Protein: 7 g/dL (ref 6.1–8.1)
eGFR: 60 mL/min/{1.73_m2} (ref >=60)

## 2021-09-23 LAB — CBC WITH DIFFERENTIAL/PLATELET
Absolute Monocytes: 477 cells/uL (ref 200–950)
Basophils Absolute: 0 cells/uL (ref 0–200)
Basophils Relative: 0 %
Eosinophils Absolute: 129 cells/uL (ref 15–500)
Eosinophils Relative: 3 %
HCT: 44.3 % (ref 35.0–45.0)
Hemoglobin: 14.4 g/dL (ref 11.7–15.5)
Lymphs Abs: 1049 cells/uL (ref 850–3900)
MCH: 28.6 pg (ref 27.0–33.0)
MCHC: 32.5 g/dL (ref 32.0–36.0)
MCV: 87.9 fL (ref 80.0–100.0)
MPV: 11.3 fL (ref 7.5–12.5)
Monocytes Relative: 11.1 %
Neutro Abs: 2645 cells/uL (ref 1500–7800)
Neutrophils Relative %: 61.5 %
Platelets: 216 10*3/uL (ref 140–400)
RBC: 5.04 10*6/uL (ref 3.80–5.10)
RDW: 13.4 % (ref 11.0–15.0)
Total Lymphocyte: 24.4 %
WBC: 4.3 10*3/uL (ref 3.8–10.8)

## 2021-09-23 LAB — HEMOGLOBIN A1C
Hgb A1c MFr Bld: 6.5 % of total Hgb — ABNORMAL HIGH (ref <5.7)
Mean Plasma Glucose: 140 mg/dL
eAG (mmol/L): 7.7 mmol/L

## 2021-09-23 LAB — TSH: TSH: 3.1 mIU/L (ref 0.40–4.50)

## 2021-09-23 LAB — LIPID PANEL
Cholesterol: 195 mg/dL (ref <200)
HDL: 55 mg/dL (ref >=50)
LDL Cholesterol (Calc): 100 mg/dL (calc) — ABNORMAL HIGH
Non-HDL Cholesterol (Calc): 140 mg/dL (calc) — ABNORMAL HIGH (ref <130)
Total CHOL/HDL Ratio: 3.5 (calc) (ref <5.0)
Triglycerides: 302 mg/dL — ABNORMAL HIGH (ref <150)

## 2021-09-23 LAB — VITAMIN D 25 HYDROXY (VIT D DEFICIENCY, FRACTURES): Vit D, 25-Hydroxy: 62 ng/mL (ref 30–100)

## 2021-09-26 ENCOUNTER — Ambulatory Visit (INDEPENDENT_AMBULATORY_CARE_PROVIDER_SITE_OTHER): Payer: Medicare HMO | Admitting: Internal Medicine

## 2021-09-26 ENCOUNTER — Encounter: Payer: Self-pay | Admitting: Internal Medicine

## 2021-09-26 VITALS — BP 126/70 | HR 84 | Temp 97.4°F | Ht 63.0 in | Wt 168.2 lb

## 2021-09-26 DIAGNOSIS — Z860101 Personal history of adenomatous and serrated colon polyps: Secondary | ICD-10-CM

## 2021-09-26 DIAGNOSIS — E1169 Type 2 diabetes mellitus with other specified complication: Secondary | ICD-10-CM | POA: Diagnosis not present

## 2021-09-26 DIAGNOSIS — G4733 Obstructive sleep apnea (adult) (pediatric): Secondary | ICD-10-CM

## 2021-09-26 DIAGNOSIS — H903 Sensorineural hearing loss, bilateral: Secondary | ICD-10-CM | POA: Diagnosis not present

## 2021-09-26 DIAGNOSIS — Z86010 Personal history of colonic polyps: Secondary | ICD-10-CM

## 2021-09-26 DIAGNOSIS — E782 Mixed hyperlipidemia: Secondary | ICD-10-CM | POA: Diagnosis not present

## 2021-09-26 DIAGNOSIS — Z87891 Personal history of nicotine dependence: Secondary | ICD-10-CM | POA: Diagnosis not present

## 2021-09-26 DIAGNOSIS — Z6829 Body mass index (BMI) 29.0-29.9, adult: Secondary | ICD-10-CM

## 2021-09-26 DIAGNOSIS — Z8709 Personal history of other diseases of the respiratory system: Secondary | ICD-10-CM | POA: Diagnosis not present

## 2021-09-26 DIAGNOSIS — Z0000 Encounter for general adult medical examination without abnormal findings: Secondary | ICD-10-CM | POA: Diagnosis not present

## 2021-09-26 NOTE — Patient Instructions (Addendum)
RTC in 6 weeks for Hgb AIC, lipid and OV.  Start Januvia 50 mg daily.

## 2021-09-26 NOTE — Progress Notes (Signed)
Annual Wellness Visit     Patient: Dawn Herrera [facility], Female    DOB: 04-13-1950, 71 y.o.   MRN: [id number] Visit Date: 09/26/2021   Subjective    Dawn Herrera is a 71 y.o. female who presents today for her Annual Wellness Visit.  HPI She also presents for health maintenance exam and evaluation of medical issues.  She has a history of obstructive sleep apnea and has CPAP device but has failed CPAP.  Has been evaluated by Dr. Ilda Foil in [facility] and is planning to have hypoglossal nerve stimulator implantation in November.  Had her endoscopy by Dr. Ilda Foil in June.  Patient is a former smoker and gets CT chest lung cancer screening through Pulmonology. History of sensorineural hearing loss.  History of impaired glucose tolerance, hyperlipidemia, COPD, benign microscopic hematuria, osteopenia and adenomatous polyps.  History of positional vertigo October 2015 that responded to Epley maneuver.  Remote history of left plantar fasciitis and has seen podiatrist at [facility].  Has seen Dr. Lawerance Bach for microscopic hematuria in 2007 with normal cystoscopy and ureteroscopy.  In June 2015 had an episode of epigastric pain and was diagnosed with H. pylori gastritis and was treated with 3 drug regimen.  Endoscopy showed duodenitis and gastritis along with hiatal hernia.  History of bilateral hearing loss and wears bilateral hearing aids.  History of vitamin D deficiency.  Saw Dr. Daylene Katayama in 2009 for impingement of right shoulder with mild adhesive capsulitis.  History of adenomatous colon polyp.  Had colonoscopy in 2007 showing 1 small adenomatous polyp in the descending colon.  She had repeat colonoscopy in January 2012 showing hyperplastic polyps by Dr. Michail Sermon and [facility].  No known drug allergies  Social history: She is working for [employer] a couple of days a week.  She has 2 adult daughters.  Previously smoked a half to a pack of cigarettes daily  for over 30 years and quit smoking about 6 years ago.  Social alcohol consumption.  Family history: Father died at age 67 with congestive heart failure with history of diabetes.  Mother with coronary artery disease, MI status post CABG.  1 brother and 4 sisters in good health.   She had coronary calcium scoring in January 2023 and total score was 7.63.      Review of Systems see above   Objective    Vitals: BP 126/70   Pulse 84   Temp (!) 97.4 F (36.3 C) (Tympanic)   Ht '5\' 3"'$  (1.6 m)   Wt 168 lb 4 oz (76.3 kg)   LMP 03/14/1995 (Approximate)   SpO2 97%   BMI 29.80 kg/m   Physical Exam skin: Warm and dry.  TMs clear.  Pharynx clear.  Neck supple.  No carotid bruits.  Chest clear.  Cardiac exam: Regular rate and rhythm without ectopy.  Abdomen is soft nondistended without hepatosplenomegaly masses or tenderness.  No lower extremity pitting edema.  Brief neurological exam is intact without gross focal deficits.  Affect thought and judgment are normal.   Most recent functional status assessment:    09/26/2021    2:52 PM  In your present state of health, do you have any difficulty performing the following activities:  Hearing? 0  Vision? 0  Difficulty concentrating or making decisions? 0  Walking or climbing stairs? 0  Dressing or bathing? 0  Doing errands, shopping? 0  Preparing Food and eating ? N  Using the Toilet? N  In the  past six months, have you accidently leaked urine? Y  Do you have problems with loss of bowel control? Y  Managing your Medications? N  Managing your Finances? N  Housekeeping or managing your Housekeeping? N   Most recent fall risk assessment:    09/26/2021    2:52 PM  [place] in the past year? 0  Number falls in past yr: 0  Injury with Fall? 0  Risk for fall due to : No Fall Risks  Follow up Falls evaluation completed    Most recent depression screenings:    09/26/2021    2:52 PM 09/23/2020   10:06 AM  PHQ 2/9 Scores  PHQ - 2  Score 0 1   Most recent cognitive screening:    09/26/2021    2:53 PM  6CIT Screen  What Year? 0 points  What month? 0 points  What time? 0 points  Count back from 20 0 points  Months in reverse 0 points  Repeat phrase 0 points  Total Score 0 points       Assessment & Plan  Impaired glucose tolerance-hemoglobin A1c 6.5%.  Was started on Januvia 50 mg daily in July.  Fasting glucose July 2022 of 129  Hypertriglyceridemia with history of mixed hyperlipidemia in 2022.  Triglycerides are 302 and previously were 208 in January 2023.  Total cholesterol and LDL are basically normal.  Has seen Dr. Debara Pickett.  History of sleep apnea-being evaluated for inspire device  History of smoking and mild COPD.  Quit smoking some 7 years ago  Grief reaction-lost husband due to sudden cardiac event  History of glucose intolerance  History of glaucoma   Plan: Return in early September for follow-up.  Started on Januvia 50 mg daily with this visit.        Annual wellness visit done today including the all of the following: Reviewed patient's Family Medical History Reviewed and updated list of patient's medical providers Assessment of cognitive impairment was done Assessed patient's functional ability Established a written schedule for health screening [place] Completed and Reviewed  Discussed health benefits of physical activity, and encouraged her to engage in regular exercise appropriate for her age and condition.         {I, Elby Showers, MD, have reviewed all documentation for this visit. The documentation on 11/10/21 for the exam, diagnosis, procedures, and orders are all accurate and complete.   Angus Seller, CMA

## 2021-09-27 LAB — MICROALBUMIN, URINE: Microalb, Ur: 0.2 mg/dL

## 2021-09-28 ENCOUNTER — Encounter: Payer: Self-pay | Admitting: Internal Medicine

## 2021-09-28 MED ORDER — SITAGLIPTIN PHOSPHATE 50 MG PO TABS: 50.0000 mg | ORAL_TABLET | Freq: Every day | ORAL | 0 refills | Status: DC

## 2021-10-07 DIAGNOSIS — G4733 Obstructive sleep apnea (adult) (pediatric): Secondary | ICD-10-CM | POA: Diagnosis not present

## 2021-10-19 ENCOUNTER — Encounter (INDEPENDENT_AMBULATORY_CARE_PROVIDER_SITE_OTHER): Payer: Self-pay

## 2021-10-25 DIAGNOSIS — G4733 Obstructive sleep apnea (adult) (pediatric): Secondary | ICD-10-CM | POA: Diagnosis not present

## 2021-10-25 DIAGNOSIS — Z9989 Dependence on other enabling machines and devices: Secondary | ICD-10-CM | POA: Diagnosis not present

## 2021-11-07 DIAGNOSIS — G4733 Obstructive sleep apnea (adult) (pediatric): Secondary | ICD-10-CM | POA: Diagnosis not present

## 2021-11-10 ENCOUNTER — Other Ambulatory Visit: Payer: Medicare HMO

## 2021-11-10 DIAGNOSIS — E782 Mixed hyperlipidemia: Secondary | ICD-10-CM | POA: Diagnosis not present

## 2021-11-10 DIAGNOSIS — E119 Type 2 diabetes mellitus without complications: Secondary | ICD-10-CM | POA: Diagnosis not present

## 2021-11-10 DIAGNOSIS — R7302 Impaired glucose tolerance (oral): Secondary | ICD-10-CM | POA: Diagnosis not present

## 2021-11-11 LAB — HEMOGLOBIN A1C
Hgb A1c MFr Bld: 6.3 % of total Hgb — ABNORMAL HIGH (ref <5.7)
Mean Plasma Glucose: 134 mg/dL
eAG (mmol/L): 7.4 mmol/L

## 2021-11-11 LAB — LIPID PANEL
Cholesterol: 180 mg/dL (ref <200)
HDL: 55 mg/dL (ref >=50)
LDL Cholesterol (Calc): 93 mg/dL (calc)
Non-HDL Cholesterol (Calc): 125 mg/dL (calc) (ref <130)
Total CHOL/HDL Ratio: 3.3 (calc) (ref <5.0)
Triglycerides: 234 mg/dL — ABNORMAL HIGH (ref <150)

## 2021-11-15 ENCOUNTER — Ambulatory Visit (INDEPENDENT_AMBULATORY_CARE_PROVIDER_SITE_OTHER): Payer: Medicare HMO | Admitting: Internal Medicine

## 2021-11-15 ENCOUNTER — Encounter: Payer: Self-pay | Admitting: Internal Medicine

## 2021-11-15 ENCOUNTER — Telehealth: Payer: Self-pay

## 2021-11-15 VITALS — BP 122/70 | HR 63 | Temp 98.3°F | Ht 63.0 in | Wt 168.0 lb

## 2021-11-15 DIAGNOSIS — G4733 Obstructive sleep apnea (adult) (pediatric): Secondary | ICD-10-CM

## 2021-11-15 DIAGNOSIS — E1169 Type 2 diabetes mellitus with other specified complication: Secondary | ICD-10-CM

## 2021-11-15 DIAGNOSIS — E782 Mixed hyperlipidemia: Secondary | ICD-10-CM

## 2021-11-15 DIAGNOSIS — Z6829 Body mass index (BMI) 29.0-29.9, adult: Secondary | ICD-10-CM

## 2021-11-15 MED ORDER — SITAGLIPTIN PHOSPHATE 100 MG PO TABS: 100.0000 mg | ORAL_TABLET | Freq: Every day | ORAL | 1 refills | Status: DC

## 2021-11-15 NOTE — Progress Notes (Signed)
   Subjective:    Patient ID: Dawn Herrera, female    DOB: [birth year], 71 y.o.   MRN: [id number]  HPI Patient here today for follow-up on hyperlipidemia and glucose intolerance.  In July, her triglycerides were 302 and her LDL cholesterol was 100.  Fasting glucose was 128 and hemoglobin A1c was 6.5%.  Vitamin D level was excellent at 62.  She has seen Dr. Ilda Foil ENT physician regarding sleep apnea and CPAP.  She will be using an inspire device.  Recently her labs were repeated and hemoglobin A1c had improved slightly to 6.3% from 6.5% in July.  Triglyceride levels improved from 302 to 234 on rosuvastatin 20 mg daily.  In July she was started on Januvia 50 mg daily.  Patient says she has been watching her diet.  She had hoped her numbers would be even better.  I think it is prudent for her to increase rosuvastatin to 40 mg daily but she indicates she may not be comfortable doing that.    She is planning a cruise with her sisters in the near future.  She did see Dr. Harrell Gave in February 2023 and she was advised to increase rosuvastatin to 20 mg daily.  She was also advised to start metformin.  She was reluctant to make these changes.  She does monitor her Accu-Cheks at home.  She had coronary calcium scoring in January 2023.  Left anterior descending artery score was 0, circumflex artery score 3.97 and right coronary artery score was 3.66 with total score 7.63 placing her in the 50th percentile.  Given her score and her history of diabetes and hyperlipidemia it was prudent for her to be on statin medication.  She was started on Januvia 50 mg daily in July.    Review of Systems she basically feels well and is excited about her trip.     Objective:   Physical Exam  Blood pressure is excellent at 122/70 pulse 63 temperature 98.3 degrees pulse oximetry 97 %.  Weight 168 pounds.  BMI 29.76  Skin: Warm and dry.  No cervical adenopathy or thyromegaly.  Chest clear.  No lower extremity  pitting edema.  Cardiac exam: Regular rate and rhythm.      Assessment & Plan:  Impaired glucose tolerance-started on Januvia 50 mg daily in July.  Would like for her to take 100 mg daily.  In July triglycerides were 302 and previously were 208 in January 2023.  She has seen Dr. Debara Pickett in the remote past.  History of glaucoma  Plan: She will take Januvia 100 mg daily.  Suggest rosuvastatin 40 mg daily.  She may not be comfortable taking this amount just yet.  She will return in January for 46-monthfollow-up visit.

## 2021-11-15 NOTE — Patient Instructions (Addendum)
Patient will take Januvia 100 mg daily.  I would like for her to take rosuvastatin 40 mg daily.  Currently taking rosuvastatin 20 mg daily.  Return in January 2024 for follow-up, hemoglobin A1c and fasting lipid panel.

## 2021-11-15 NOTE — Telephone Encounter (Signed)
Patient called back and confirmed she is taking Crestor 20 mg and will take 2 bid until finish with Rx and will call the office to ask for a new Rx of 40 mg

## 2021-11-15 NOTE — Telephone Encounter (Signed)
Spoke with pharmacy staff to verify Rx rosuvastatin (CRESTOR) And was told Rx is 20 mg Also called patient  to inform of the dose change and patient stated she is taking 40 mg and will stop by the pharmacy to discuss with pharmacy staff

## 2021-12-06 ENCOUNTER — Telehealth: Payer: Self-pay | Admitting: Internal Medicine

## 2021-12-06 ENCOUNTER — Encounter: Payer: Self-pay | Admitting: Internal Medicine

## 2021-12-06 DIAGNOSIS — G4733 Obstructive sleep apnea (adult) (pediatric): Secondary | ICD-10-CM | POA: Diagnosis not present

## 2021-12-06 MED ORDER — ROSUVASTATIN CALCIUM 40 MG PO TABS: 40.0000 mg | ORAL_TABLET | Freq: Every day | ORAL | 3 refills | Status: DC

## 2021-12-06 NOTE — Telephone Encounter (Deleted)
Refill Crestor 40 mg daily (#90) with 3 refills

## 2021-12-06 NOTE — Telephone Encounter (Signed)
Patient needs refill on statin medication. Send in Crestor generic 40 mg #90 one by mouth daily with 3 refills.

## 2021-12-06 NOTE — Telephone Encounter (Addendum)
[facility] [phone]  Dawn Herrera has been taking the 40 mg of below medication and needs refills sent to her pharmacy.  rosuvastatin (CRESTOR) 40 MG tablet  [id number]:  [phone] Fax:  [phone]

## 2021-12-06 NOTE — Addendum Note (Signed)
Addended by: Elby Showers on: 12/06/2021 04:59 PM   Modules accepted: Orders

## 2021-12-25 ENCOUNTER — Other Ambulatory Visit: Payer: Self-pay | Admitting: Internal Medicine

## 2022-01-05 DIAGNOSIS — G4733 Obstructive sleep apnea (adult) (pediatric): Secondary | ICD-10-CM | POA: Diagnosis not present

## 2022-01-06 DIAGNOSIS — E119 Type 2 diabetes mellitus without complications: Secondary | ICD-10-CM | POA: Diagnosis not present

## 2022-01-06 DIAGNOSIS — J984 Other disorders of lung: Secondary | ICD-10-CM | POA: Diagnosis not present

## 2022-01-06 DIAGNOSIS — Z01810 Encounter for preprocedural cardiovascular examination: Secondary | ICD-10-CM | POA: Diagnosis not present

## 2022-01-06 DIAGNOSIS — H04123 Dry eye syndrome of bilateral lacrimal glands: Secondary | ICD-10-CM | POA: Diagnosis not present

## 2022-01-06 DIAGNOSIS — G4733 Obstructive sleep apnea (adult) (pediatric): Secondary | ICD-10-CM | POA: Diagnosis not present

## 2022-01-06 DIAGNOSIS — H401131 Primary open-angle glaucoma, bilateral, mild stage: Secondary | ICD-10-CM | POA: Diagnosis not present

## 2022-01-06 DIAGNOSIS — I440 Atrioventricular block, first degree: Secondary | ICD-10-CM | POA: Diagnosis not present

## 2022-01-06 DIAGNOSIS — H0102B Squamous blepharitis left eye, upper and lower eyelids: Secondary | ICD-10-CM | POA: Diagnosis not present

## 2022-01-09 DIAGNOSIS — J984 Other disorders of lung: Secondary | ICD-10-CM | POA: Diagnosis not present

## 2022-01-09 DIAGNOSIS — I440 Atrioventricular block, first degree: Secondary | ICD-10-CM | POA: Diagnosis not present

## 2022-01-13 DIAGNOSIS — E119 Type 2 diabetes mellitus without complications: Secondary | ICD-10-CM | POA: Diagnosis not present

## 2022-01-13 DIAGNOSIS — Z87891 Personal history of nicotine dependence: Secondary | ICD-10-CM | POA: Diagnosis not present

## 2022-01-13 DIAGNOSIS — J811 Chronic pulmonary edema: Secondary | ICD-10-CM | POA: Diagnosis not present

## 2022-01-13 DIAGNOSIS — Z6829 Body mass index (BMI) 29.0-29.9, adult: Secondary | ICD-10-CM | POA: Diagnosis not present

## 2022-01-13 DIAGNOSIS — G4733 Obstructive sleep apnea (adult) (pediatric): Secondary | ICD-10-CM | POA: Diagnosis not present

## 2022-01-13 DIAGNOSIS — Z7984 Long term (current) use of oral hypoglycemic drugs: Secondary | ICD-10-CM | POA: Diagnosis not present

## 2022-02-05 DIAGNOSIS — G4733 Obstructive sleep apnea (adult) (pediatric): Secondary | ICD-10-CM | POA: Diagnosis not present

## 2022-02-08 ENCOUNTER — Encounter: Payer: Self-pay | Admitting: Internal Medicine

## 2022-02-09 ENCOUNTER — Other Ambulatory Visit: Payer: Self-pay

## 2022-02-09 MED ORDER — GLUCOSE BLOOD VI STRP: ORAL_STRIP | 12 refills | Status: DC

## 2022-02-09 MED ORDER — ONETOUCH DELICA LANCETS 30G MISC: 1 refills | Status: DC

## 2022-02-12 ENCOUNTER — Encounter: Payer: Self-pay | Admitting: Internal Medicine

## 2022-02-13 NOTE — Telephone Encounter (Signed)
Spoke with pharmacy staff and lancets changed to [employer]

## 2022-02-17 ENCOUNTER — Encounter (HOSPITAL_BASED_OUTPATIENT_CLINIC_OR_DEPARTMENT_OTHER): Payer: Self-pay | Admitting: Pulmonary Disease

## 2022-02-17 ENCOUNTER — Ambulatory Visit (INDEPENDENT_AMBULATORY_CARE_PROVIDER_SITE_OTHER): Payer: Medicare HMO | Admitting: Pulmonary Disease

## 2022-02-17 VITALS — BP 120/64 | HR 88 | Temp 98.2°F | Ht 63.25 in | Wt 168.6 lb

## 2022-02-17 DIAGNOSIS — G4733 Obstructive sleep apnea (adult) (pediatric): Secondary | ICD-10-CM

## 2022-02-17 DIAGNOSIS — M5137 Other intervertebral disc degeneration, lumbosacral region: Secondary | ICD-10-CM | POA: Diagnosis not present

## 2022-02-17 DIAGNOSIS — M5417 Radiculopathy, lumbosacral region: Secondary | ICD-10-CM | POA: Diagnosis not present

## 2022-02-17 NOTE — Patient Instructions (Signed)
Defer activation x 6 weeks until hematoma resolves & no pain on moving tongue/ jaw

## 2022-02-17 NOTE — Progress Notes (Signed)
   Subjective:    Patient ID: Dawn Herrera, female    DOB: Feb 09, 1951, 71 y.o.   MRN: [id number]  HPI  71 year old for follow-up of OSA She was diagnosed in 2012.  Initially treated with dental appliance but then switched to CPAP.   She was intolerant of CPAP and underwent implantation of hypoglossal nerve stimulator device 11/3 at [facility] She presents for activation visit today. Postop developed hematoma, reviewed ENT follow-up visit.  She reports some jaw pain when opening her mouth and sticking her tongue out.  Hematoma has improved and decreased in size.  She also reports some pain at the device site especially when she sleeps on the other side and she has to push the device back in position  Significant tests/ events reviewed   NPSG 01/2011:  AHI 25/hr  Review of Systems neg for any significant sore throat, dysphagia, itching, sneezing, nasal congestion or excess/ purulent secretions, fever, chills, sweats, unintended wt loss, pleuritic or exertional cp, hempoptysis, orthopnea pnd or change in chronic leg swelling. Also denies presyncope, palpitations, heartburn, abdominal pain, nausea, vomiting, diarrhea or change in bowel or urinary habits, dysuria,hematuria, rash, arthralgias, visual complaints, headache, numbness weakness or ataxia.     Objective:   Physical Exam  Gen. Pleasant, obese, in no distress ENT - no lesions, no post nasal drip, tongue midline, mild tenderness right neck at incision site Neck: No JVD, no thyromegaly, no carotid bruits, Lungs: no use of accessory muscles, no dullness to percussion, decreased without rales or rhonchi  Cardiovascular: Rhythm regular, heart sounds  normal, no murmurs or gallops, no peripheral edema Musculoskeletal: No deformities, no cyanosis or clubbing , no tremors       Assessment & Plan:

## 2022-02-17 NOTE — Assessment & Plan Note (Signed)
Activation of hypoglossal nerve stimulator will be deferred today since she has residual hematoma and some pain on moving her tongue. We will wait 4 to 6 weeks for resolution of hematoma before activating Meantime I have asked her to resume CPAP use.  She has enough CPAP supplies

## 2022-03-09 ENCOUNTER — Other Ambulatory Visit: Payer: Self-pay | Admitting: Internal Medicine

## 2022-03-24 ENCOUNTER — Ambulatory Visit (HOSPITAL_BASED_OUTPATIENT_CLINIC_OR_DEPARTMENT_OTHER): Payer: Medicare HMO | Admitting: Pulmonary Disease

## 2022-03-27 ENCOUNTER — Other Ambulatory Visit: Payer: Medicare HMO

## 2022-03-27 DIAGNOSIS — E782 Mixed hyperlipidemia: Secondary | ICD-10-CM

## 2022-03-27 DIAGNOSIS — E119 Type 2 diabetes mellitus without complications: Secondary | ICD-10-CM

## 2022-03-28 ENCOUNTER — Ambulatory Visit (INDEPENDENT_AMBULATORY_CARE_PROVIDER_SITE_OTHER): Payer: Medicare HMO | Admitting: Internal Medicine

## 2022-03-28 ENCOUNTER — Ambulatory Visit (INDEPENDENT_AMBULATORY_CARE_PROVIDER_SITE_OTHER): Payer: Medicare HMO | Admitting: Pulmonary Disease

## 2022-03-28 ENCOUNTER — Encounter (HOSPITAL_BASED_OUTPATIENT_CLINIC_OR_DEPARTMENT_OTHER): Payer: Self-pay | Admitting: Pulmonary Disease

## 2022-03-28 ENCOUNTER — Encounter: Payer: Self-pay | Admitting: Internal Medicine

## 2022-03-28 VITALS — BP 108/64 | HR 76 | Temp 98.3°F | Ht 64.0 in | Wt 169.2 lb

## 2022-03-28 VITALS — BP 112/68 | HR 77 | Temp 98.1°F | Ht 63.25 in | Wt 170.0 lb

## 2022-03-28 DIAGNOSIS — G4733 Obstructive sleep apnea (adult) (pediatric): Secondary | ICD-10-CM

## 2022-03-28 DIAGNOSIS — Z6829 Body mass index (BMI) 29.0-29.9, adult: Secondary | ICD-10-CM | POA: Diagnosis not present

## 2022-03-28 DIAGNOSIS — E1169 Type 2 diabetes mellitus with other specified complication: Secondary | ICD-10-CM | POA: Diagnosis not present

## 2022-03-28 DIAGNOSIS — E782 Mixed hyperlipidemia: Secondary | ICD-10-CM

## 2022-03-28 LAB — HEPATIC FUNCTION PANEL
AG Ratio: 1.5 (calc) (ref 1.0–2.5)
ALT: 21 U/L (ref 6–29)
AST: 20 U/L (ref 10–35)
Albumin: 4.1 g/dL (ref 3.6–5.1)
Alkaline phosphatase (APISO): 97 U/L (ref 37–153)
Bilirubin, Direct: 0.1 mg/dL (ref 0.0–0.2)
Globulin: 2.7 g/dL (calc) (ref 1.9–3.7)
Indirect Bilirubin: 0.4 mg/dL (calc) (ref 0.2–1.2)
Total Bilirubin: 0.5 mg/dL (ref 0.2–1.2)
Total Protein: 6.8 g/dL (ref 6.1–8.1)

## 2022-03-28 LAB — LIPID PANEL
Cholesterol: 171 mg/dL (ref <200)
HDL: 59 mg/dL (ref >=50)
LDL Cholesterol (Calc): 80 mg/dL (calc)
Non-HDL Cholesterol (Calc): 112 mg/dL (calc) (ref <130)
Total CHOL/HDL Ratio: 2.9 (calc) (ref <5.0)
Triglycerides: 219 mg/dL — ABNORMAL HIGH (ref <150)

## 2022-03-28 LAB — HEMOGLOBIN A1C
Hgb A1c MFr Bld: 6.6 % of total Hgb — ABNORMAL HIGH (ref <5.7)
Mean Plasma Glucose: 143 mg/dL
eAG (mmol/L): 7.9 mmol/L

## 2022-03-28 LAB — MICROALBUMIN / CREATININE URINE RATIO
Creatinine, Urine: 62 mg/dL (ref 20–275)
Microalb Creat Ratio: 3 mcg/mg creat (ref <30)
Microalb, Ur: 0.2 mg/dL

## 2022-03-28 MED ORDER — METFORMIN HCL 500 MG PO TABS: 500.0000 mg | ORAL_TABLET | Freq: Two times a day (BID) | ORAL | 0 refills | Status: DC

## 2022-03-28 NOTE — Patient Instructions (Addendum)
Patient wants to stop Januvia. She will try Metformin 500 mg twice daily and follow up in 6 weeks with Hgb AIC without OV. May need to see Endocrinologist.  Medicare wellness visit and health maintenance exam scheduled for summer 2024.

## 2022-03-28 NOTE — Progress Notes (Signed)
   Subjective:    Patient ID: Dawn Herrera, female    DOB: 1950/06/15, 72 y.o.   MRN: [id number]  HPI  72 yo for follow-up of OSA She was diagnosed in 2012.  Initially treated with dental appliance but then switched to CPAP.    She was intolerant of CPAP and underwent implantation of hypoglossal nerve stimulator device 11/3 at [facility] She presents for activation visit today.  She has been very compliant with her CPAP machine and is using it every night.  CPAP is set at 11 cm She denies any trouble swallowing or moving her tongue.  No pain at the incision sites  Significant tests/ events reviewed NPSG 01/2011:  AHI 25/hr t of CPAP and underwent implantation of hypoglossal nerve stimulator device 11/3 at [place] neg for any significant sore throat, dysphagia, itching, sneezing, nasal congestion or excess/ purulent secretions, fever, chills, sweats, unintended wt loss, pleuritic or exertional cp, hempoptysis, orthopnea pnd or change in chronic leg swelling. Also denies presyncope, palpitations, heartburn, abdominal pain, nausea, vomiting, diarrhea or change in bowel or urinary habits, dysuria,hematuria, rash, arthralgias, visual complaints, headache, numbness weakness or ataxia.     Objective:   Physical Exam  Gen. Pleasant, obese, in no distress ENT - no lesions, no post nasal drip Neck: No JVD, no thyromegaly, no carotid bruits, incision has healed well Lungs: Keloid on chest incision no use of accessory muscles, no dullness to percussion, decreased without rales or rhonchi  Cardiovascular: Rhythm regular, heart sounds  normal, no murmurs or gallops, no peripheral edema Musculoskeletal: No deformities, no cyanosis or clubbing , no tremors       Assessment & Plan:

## 2022-03-28 NOTE — Assessment & Plan Note (Signed)
Hypoglossal nerve stimulator was activated today.  Incision was checked, tongue protrusion was examined Programming : The activation workflow was followed 1.  Stimulation level : Sensation 1.5 V , functional level 1.6 V lower limit, upper limit 2.6 V 2.  Start delay 30 minutes, pause time 15 minutes, duration 8 hours 3.  Sensing waveform was analyzed for 3 minutes 4.  Sleep remote education was provided patient demonstrated competency with the remote and was aware of patient Instruction videos and sleep remote guide 5.  Patient was instructed to step up levels by 1 level (0.1 V ) every week 6.  Check-in visit in 1 month after activation visit to ensure that they are stepping up levels, using therapy " all night, every night" and to evaluate subjective benefit.   7.  Inspire titration sleep study will be scheduled 3 months after the activation visit

## 2022-03-28 NOTE — Progress Notes (Addendum)
   Subjective:    Patient ID: Dawn Herrera, female    DOB: 1950/12/30, 72 y.o.   MRN: [id number]  HPI 72 year old  Female seen for follow up on diabetes. Does not think Januvia is working.  Currently, she is on Januvia 100 mg daily.  She does not think it is working and would like to stop Januvia altogether. We agreed today she will try metformin 500 mg twice daily instead.  She reports frustration with pharmacy trying to get Januvia approved by her insurance.  She can follow-up in 6 weeks with hemoglobin A1c without office visit.  She may need to see Endocrinologist if hemoglobin A1c is not coming under control.  She is also on Rosuvastatin 40 mg daily.  Recent lipid panel shows total cholesterol and LDL to be normal but triglycerides are 219.  In July, triglycerides were 302.  Has been traveling some with her sisters.  She had flu vaccine October 2023.  She has had 2 Shingrix vaccines.  Had tetanus immunization May 2023.  Review of Systems Seeing Dr. Elsworth Soho today for follow up on recent sleep apnea procedure.  Hypoglossal nerve stimulator implant done January 13, 2022 by Dr. Ilda Foil in [facility].     Objective:   Physical Exam  Blood pressure is excellent at 112/68, pulse 77 regular, temperature 98.1 degrees, pulse oximetry 97%, weight 170 pounds ,height 5 feet 3.25 inches ,BMI 29.8   Chest clear.  Cardiac exam: Regular rate and rhythm.    Assessment & Plan:  Mixed hyperlipidemia treated with statin. Triglycerides remain elevated.  Type 2 diabetes mellitus.  Patient frustrated with Januvia and wants to try metformin alone.  Will need follow-up in  about 6 weeks.  May need to see Endocrinologist.  History of sleep apnea now with Inspire device  Plan: Discontinuing Januvia and trying diet with metformin alone. I think she may need additional med but she will return in 6 weeks for Hgb AIC. May need referral to Endocrine.

## 2022-03-28 NOTE — Patient Instructions (Signed)
Your device was activated today  Delay 30 m Pause 15 m Therapy time x 8h  Start at level 1 , every week, increase by 1 level unless you have discomfort

## 2022-03-29 ENCOUNTER — Encounter: Payer: Self-pay | Admitting: Pulmonary Disease

## 2022-03-30 ENCOUNTER — Telehealth: Payer: Self-pay | Admitting: Internal Medicine

## 2022-03-30 NOTE — Telephone Encounter (Signed)
Called to get clarification on Januvia 100 mg / Metaformin.   Dawn Herrera started taking Januvia 100 MG in September per office visit 11/15/2021 and OV 03/28/2022 she is discontinuing Januvia 100 mg and is going to try Metaformin.

## 2022-03-31 ENCOUNTER — Encounter: Payer: Self-pay | Admitting: Pulmonary Disease

## 2022-03-31 NOTE — Telephone Encounter (Signed)
LVM to CB so I could ask her about pricing of Januvia.

## 2022-04-01 DIAGNOSIS — G4733 Obstructive sleep apnea (adult) (pediatric): Secondary | ICD-10-CM | POA: Diagnosis not present

## 2022-04-02 NOTE — Addendum Note (Signed)
Addended by: Elby Showers on: 04/02/2022 04:29 PM   Modules accepted: Orders

## 2022-05-02 ENCOUNTER — Ambulatory Visit (INDEPENDENT_AMBULATORY_CARE_PROVIDER_SITE_OTHER): Payer: Medicare HMO | Admitting: Pulmonary Disease

## 2022-05-02 ENCOUNTER — Encounter (HOSPITAL_BASED_OUTPATIENT_CLINIC_OR_DEPARTMENT_OTHER): Payer: Self-pay | Admitting: Pulmonary Disease

## 2022-05-02 VITALS — BP 116/66 | HR 77 | Temp 98.0°F | Ht 64.0 in | Wt 166.2 lb

## 2022-05-02 DIAGNOSIS — G4733 Obstructive sleep apnea (adult) (pediatric): Secondary | ICD-10-CM

## 2022-05-02 MED ORDER — TRIAMCINOLONE ACETONIDE 0.1 % EX CREA: 1.0000 | TOPICAL_CREAM | Freq: Two times a day (BID) | CUTANEOUS | 0 refills | Status: DC

## 2022-05-02 NOTE — Patient Instructions (Signed)
Stay at level 4 until March 1, then attempt increase to level 5 if tolerated If not, go back donw to level 4  X kenalog cream 1% to scar for itching

## 2022-05-02 NOTE — Assessment & Plan Note (Signed)
She seems to be adjusting very well, level 4/1.9 V seems to be a comfort zone.  I have asked her to stay at this level for 2 weeks and then attempt increased to level 5  Programming : The activation workflow was followed 1.  Stimulation level : Sensation 0.5 V , functional level 0.7 V lower limit, upper limit 1.7 V 2.  Start delay 30 minutes, pause time 15 minutes, duration 9 hours 3.  patient demonstrated competency with the remote and was aware of patient Instruction videos and sleep remote guide 4.  She is using therapy " all night, every night" and having subjective benefit.   7.  Inspire titration sleep study will be scheduled in 2 months

## 2022-05-02 NOTE — Progress Notes (Signed)
   Subjective:    Patient ID: Dawn Herrera, female    DOB: 10/19/1950, 72 y.o.   MRN: [id number]  HPI  72 yo for follow-up of OSA She was diagnosed in 2012.  Initially treated with dental appliance but then switched to CPAP.    She was intolerant of CPAP and underwent implantation of hypoglossal nerve stimulator device 01/13/22 at [facility] Activated 03/28/22 :Stimulation level : Sensation 1.5 V , functional level 1.6 V lower limit, upper limit 2.6 V 2 Start delay 30 minutes, pause time 15 minutes, duration 8 hours  1 month post activation visit She increased weekly from level 1 to level 5 /2.0 V at this level she developed discomfort and dropped down to 1.9 V/level 4 There is no bed partner history available since she is widowed.  She has slept much better last 2 nights.  After dropping the level.  Compliance is good she is using this every night for at least 8 hours as program.  She rarely uses the pause button.  She reads in bed before she falls asleep.  She stays in bed for another half an hour after implant is turned off She complains of itching at the incision site.  She can feel the implant proximal  Significant tests/ events reviewed NPSG 01/2011:  AHI 25/hr t of CPAP and underwent implantation of hypoglossal nerve stimulator device 11/3 at [place] neg for any significant sore throat, dysphagia, itching, sneezing, nasal congestion or excess/ purulent secretions, fever, chills, sweats, unintended wt loss, pleuritic or exertional cp, hempoptysis, orthopnea pnd or change in chronic leg swelling. Also denies presyncope, palpitations, heartburn, abdominal pain, nausea, vomiting, diarrhea or change in bowel or urinary habits, dysuria,hematuria, rash, arthralgias, visual complaints, headache, numbness weakness or ataxia.     Objective:   Physical Exam  Gen. Pleasant, obese, in no distress ENT - no lesions, no post nasal drip, normal tongue motion Neck: No  JVD, no thyromegaly, no carotid bruits Keloid scar Lungs: no use of accessory muscles, no dullness to percussion, decreased without rales or rhonchi  Cardiovascular: Rhythm regular, heart sounds  normal, no murmurs or gallops, no peripheral edema Musculoskeletal: No deformities, no cyanosis or clubbing , no tremors       Assessment & Plan:    Keloid at incision site -will provide Kenalog cream for local application

## 2022-05-03 ENCOUNTER — Encounter: Payer: Self-pay | Admitting: Pulmonary Disease

## 2022-05-09 ENCOUNTER — Other Ambulatory Visit: Payer: Medicare HMO

## 2022-05-09 DIAGNOSIS — E119 Type 2 diabetes mellitus without complications: Secondary | ICD-10-CM | POA: Diagnosis not present

## 2022-05-10 LAB — HEMOGLOBIN A1C
Hgb A1c MFr Bld: 6.5 % of total Hgb — ABNORMAL HIGH (ref <5.7)
Mean Plasma Glucose: 140 mg/dL
eAG (mmol/L): 7.7 mmol/L

## 2022-05-19 DIAGNOSIS — H2513 Age-related nuclear cataract, bilateral: Secondary | ICD-10-CM | POA: Diagnosis not present

## 2022-05-19 DIAGNOSIS — H04123 Dry eye syndrome of bilateral lacrimal glands: Secondary | ICD-10-CM | POA: Diagnosis not present

## 2022-05-19 DIAGNOSIS — H401131 Primary open-angle glaucoma, bilateral, mild stage: Secondary | ICD-10-CM | POA: Diagnosis not present

## 2022-05-19 DIAGNOSIS — E119 Type 2 diabetes mellitus without complications: Secondary | ICD-10-CM | POA: Diagnosis not present

## 2022-05-23 ENCOUNTER — Encounter: Payer: Self-pay | Admitting: Internal Medicine

## 2022-05-23 ENCOUNTER — Telehealth (INDEPENDENT_AMBULATORY_CARE_PROVIDER_SITE_OTHER): Payer: Medicare HMO | Admitting: Internal Medicine

## 2022-05-23 ENCOUNTER — Telehealth: Payer: Self-pay

## 2022-05-23 VITALS — BP 116/67 | Temp 97.3°F | Ht 64.0 in | Wt 161.0 lb

## 2022-05-23 DIAGNOSIS — E119 Type 2 diabetes mellitus without complications: Secondary | ICD-10-CM

## 2022-05-23 DIAGNOSIS — R7302 Impaired glucose tolerance (oral): Secondary | ICD-10-CM

## 2022-05-23 DIAGNOSIS — R1013 Epigastric pain: Secondary | ICD-10-CM | POA: Diagnosis not present

## 2022-05-23 NOTE — Telephone Encounter (Signed)
Patient called Stating the Metformin she is taking is giving her a stomach ache and headache.  She said it started a month ago but she continued to take it hoping it would get better.  Patient is asking for a call back today,  I informed her a message would be sent and someone from the office would call her back.

## 2022-05-23 NOTE — Telephone Encounter (Signed)
scheduled

## 2022-05-23 NOTE — Patient Instructions (Signed)
Patient will discontinue metformin and try Januvia alone.  She will let me know if dyspepsia continues.  We will make referral to [facility] endocrinology.  She will continue to monitor Accu-Cheks and let me know if Accu-Cheks are not under control with Januvia alone.

## 2022-05-23 NOTE — Progress Notes (Signed)
   Subjective:    Patient ID: Dawn Herrera, female    DOB: Jul 31, 1950, 72 y.o.   MRN: [id number]  HPI 72 year old Female with history of glucose intolerance seen today regarding GI distress.  Having issues with dyspepsia and sometimes headache.  She is attributing the symptoms to metformin.  She has a longstanding history of impaired glucose tolerance and previously was on Januvia and metformin but recently has just been taking metformin alone.  She has about a 30 day supply  of Januvia left.  Hemoglobin A1c in late February was 6.5% and had been 6.6% January 2024.  Patient was seen today via interactive audio and video telecommunications.  She is identified using 2 identifiers as Dawn Herrera, a patient in this practice.  She is at her home and I am at my office.  She is agreeable to visit in this format today.  Patient denies melena, bright red blood per rectum.  No vomiting.    Review of Systems no fever, chills, headache     Objective:   Physical Exam  She is seen virtually today and looks to be in mild distress complaining of some epigastric discomfort.  She says this has been ongoing since taking metformin alone and discontinuing Januvia.      Assessment & Plan:  Intolerance of metformin  Plan: Referral will be made to [facility]ocr[facility]ology.  Patient reminded it may take a few weeks to get an appointment.  In the interim, I am okay with continuing with Januvia.  Patient is agreeable with this plan.  She will let me know if dyspepsia continues on Januvia and if so we will need to do further evaluation of abdominal issues.

## 2022-05-23 NOTE — Progress Notes (Signed)
Patient Care Team: Elby Showers, MD as PCP - General (Internal Medicine) Buford Dresser, MD as PCP - Cardiology (Cardiology)  I connected with Lysle Rubens Herrera on 05/23/22 at 12:04 PM by video enabled telemedicine visit and verified that I am speaking with the correct person using two identifiers.   I discussed the limitations, risks, security and privacy concerns of performing an evaluation and management service by telemedicine and the availability of in-person appointments. I also discussed with the patient that there may be a patient responsible charge related to this service. The patient expressed understanding and agreed to proceed.   Other persons participating in the visit and their role in the encounter: Medical scribe, Daiva Huge  Patient's location: Home  Provider's location: Clinic   I provided 10 minutes of face-to-face video visit time during this encounter, and > 50% was spent counseling as documented under my assessment & plan. She is identified using two identifiers as Dawn Herrera, a patient in this practice. [name] is in her home and I am in my office. She is agreeable to visit in this format today.  Chief Complaint: abdominal pain, headache   Subjective:    Patient ID: Dawn Herrera , Female    DOB: 1950/04/09, 72 y.o.    MRN: [id number]   71 y.o. Female presents today for abdominal pain, headache. Patient has a past medical history of adenomatous polyp, allergic rhinitis, depression, hyperlipidemia, prediabetes, sleep apnea.  Reports experiencing abdominal pain, diarrhea and headache starting 04/23/22. Believes metformin is contributing. Started taking metformin on 03/29/22. No longer taking Januvia. Still has some of her Januvia 100 mg tablets. Reports blood glucose measured at home is lower than normal for her. HGBA1c at 6.5% on 05/09/22.    Past Medical History:  Diagnosis Date   Adenomatous polyp    Allergic rhinitis    Depression     Hyperlipidemia    Prediabetes    Sleep apnea    wears mouth guard     Family History  Problem Relation Age of Onset   Heart disease Father    Diabetes Other     Social History   Social History Narrative   Not on file      Review of Systems  Constitutional:  Negative for fever and malaise/fatigue.  HENT:  Negative for congestion.   Eyes:  Negative for blurred vision.  Respiratory:  Negative for cough and shortness of breath.   Cardiovascular:  Negative for chest pain, palpitations and leg swelling.  Gastrointestinal:  Positive for diarrhea and nausea. Negative for vomiting.  Musculoskeletal:  Negative for back pain.  Skin:  Negative for rash.  Neurological:  Positive for headaches. Negative for loss of consciousness.        Objective:   Vitals: BP 116/67   Temp (!) 97.3 F (36.3 C) (Oral)   Ht '5\' 4"'$  (1.626 m)   Wt 161 lb (73 kg)   LMP 03/14/1995 (Approximate)   BMI 27.64 kg/m    Physical Exam Vitals and nursing note reviewed.  Constitutional:      General: She is not in acute distress.    Appearance: Normal appearance. She is not toxic-appearing.  HENT:     Head: Normocephalic and atraumatic.  Pulmonary:     Effort: Pulmonary effort is normal.  Skin:    General: Skin is warm and dry.  Neurological:     Mental Status: She is alert and oriented to person, place, and time. Mental status is at  baseline.  Psychiatric:        Mood and Affect: Mood normal.        Behavior: Behavior normal.        Thought Content: Thought content normal.        Judgment: Judgment normal.       Results:   Studies obtained and personally reviewed by me:   Labs:       Component Value Date/Time   NA 141 09/22/2021 1014   K 4.3 09/22/2021 1014   CL 105 09/22/2021 1014   CO2 28 09/22/2021 1014   GLUCOSE 128 (H) 09/22/2021 1014   BUN 14 09/22/2021 1014   CREATININE 1.00 09/22/2021 1014   CALCIUM 10.1 09/22/2021 1014   PROT 6.8 03/27/2022 0956   ALBUMIN 4.2  09/26/2016 0917   AST 20 03/27/2022 0956   ALT 21 03/27/2022 0956   ALKPHOS 104 09/26/2016 0917   BILITOT 0.5 03/27/2022 0956   GFRNONAA 61 09/02/2019 0931   GFRAA 70 09/02/2019 0931     Lab Results  Component Value Date   WBC 4.3 09/22/2021   HGB 14.4 09/22/2021   HCT 44.3 09/22/2021   MCV 87.9 09/22/2021   PLT 216 09/22/2021    Lab Results  Component Value Date   CHOL 171 03/27/2022   HDL 59 03/27/2022   LDLCALC 80 03/27/2022   TRIG 219 (H) 03/27/2022   CHOLHDL 2.9 03/27/2022    Lab Results  Component Value Date   HGBA1C 6.5 (H) 05/09/2022     Lab Results  Component Value Date   TSH 3.10 09/22/2021      Assessment & Plan:   Type 2 diabetes mellitus: Restart Januvia 100 mg daily, refilled. Referral to Endocrinologist.  Call if symptoms of dyspepsia persist despite discontinuing metformin.    I,Alexander Ruley,acting as a Education administrator for Elby Showers, MD.,have documented all relevant documentation on the behalf of Elby Showers, MD,as directed by  Elby Showers, MD while in the presence of Elby Showers, MD.   I, Elby Showers, MD, have reviewed all documentation for this visit. The documentation on 05/23/22 for the exam, diagnosis, procedures, and orders are all accurate and complete.

## 2022-05-23 NOTE — Telephone Encounter (Signed)
Referral placed to [facility]  - Dr. Alanson Aly

## 2022-05-25 NOTE — Telephone Encounter (Signed)
Dawn Herrera called back today to say she has stopped taking the metformin and she feels much better.

## 2022-05-31 ENCOUNTER — Ambulatory Visit (HOSPITAL_BASED_OUTPATIENT_CLINIC_OR_DEPARTMENT_OTHER): Payer: Medicare HMO | Admitting: Pulmonary Disease

## 2022-06-12 ENCOUNTER — Ambulatory Visit (HOSPITAL_BASED_OUTPATIENT_CLINIC_OR_DEPARTMENT_OTHER): Payer: Medicare HMO | Admitting: Pulmonary Disease

## 2022-06-19 ENCOUNTER — Encounter (HOSPITAL_BASED_OUTPATIENT_CLINIC_OR_DEPARTMENT_OTHER): Payer: Self-pay | Admitting: Pulmonary Disease

## 2022-06-19 ENCOUNTER — Ambulatory Visit (HOSPITAL_BASED_OUTPATIENT_CLINIC_OR_DEPARTMENT_OTHER): Payer: Medicare HMO | Admitting: Pulmonary Disease

## 2022-06-19 VITALS — BP 118/62 | HR 80 | Ht 64.0 in | Wt 163.0 lb

## 2022-06-19 DIAGNOSIS — G4733 Obstructive sleep apnea (adult) (pediatric): Secondary | ICD-10-CM | POA: Diagnosis not present

## 2022-06-19 NOTE — Patient Instructions (Signed)
X schedule inspire sleep study

## 2022-06-19 NOTE — Assessment & Plan Note (Addendum)
Hypoglossal nerve stimulator was reassessed today.  Goal of the follow-up visit was to ensure good compliance, good subjective benefit, good tongue motion and good sense lead waveforms . Download was reviewed and usage appears to be up to 8 hours average per night.  She reports that snoring is still present she denies any discomfort.  She is at level 7 = 2.2V Programming :  1.  Stimulation level : Sensation 1.5 V , functional level 1.6 V lower limit, upper limit 2.6 V 2.  Start delay 30 minutes, pause time 15 minutes, duration 8 hours 3.  Sensing waveform was analyzed for 3 minutes 4.  Sleep remote education was provided patient demonstrated competency with the remote and was aware of patient Instruction videos and sleep remote guide 5.    Inspire titration sleep study will be scheduled Download was reviewed to confirm good compliance with the device

## 2022-06-19 NOTE — Progress Notes (Signed)
   Subjective:    Patient ID: Dawn Herrera, female    DOB: 02-07-1951, 72 y.o.   MRN: [id number]  HPI  72 yo for follow-up of OSA She was diagnosed in 2012.  Initially treated with dental appliance but then switched to CPAP.    She was intolerant of CPAP and underwent implantation of hypoglossal nerve stimulator device 01/13/22 at [facility] Activated 03/28/22 :Stimulation level : Sensation 1.5 V , functional level 1.6 V lower limit, upper limit 2.6 V Start delay 30 minutes, pause time 15 minutes, duration 8 hours  Previous sleep study visit. Since her last visit, she has increased to levels from 1.9 to 2.2 V.  She is up to level 7. She is able to tolerate this well, denies discomfort. When she was visiting her family they complained about her snoring that they could hear in the next room. She wakes up feeling rested. Download was reviewed which shows excellent compliance every night.  She rarely uses the pause button.  She denies any discomfort  Significant tests/ events reviewed NPSG 01/2011:  AHI 74/hr t of CPAP and underwent implantation of hypoglossal nerve stimulator device 11/3 at [facility]  Review of Systems neg for any significant sore throat, dysphagia, itching, sneezing, nasal congestion or excess/ purulent secretions, fever, chills, sweats, unintended wt loss, pleuritic or exertional cp, hempoptysis, orthopnea pnd or change in chronic leg swelling. Also denies presyncope, palpitations, heartburn, abdominal pain, nausea, vomiting, diarrhea or change in bowel or urinary habits, dysuria,hematuria, rash, arthralgias, visual complaints, headache, numbness weakness or ataxia.     Objective:   Physical Exam  Gen. Pleasant, well-nourished, in no distress ENT - no thrush, no pallor/icterus,no post nasal drip, good tongue protrusion Neck: No JVD, no thyromegaly, no carotid bruits Lungs: no use of accessory muscles, no dullness to percussion, clear without rales or rhonchi   Cardiovascular: Rhythm regular, heart sounds  normal, no murmurs or gallops, no peripheral edema Musculoskeletal: No deformities, no cyanosis or clubbing        Assessment & Plan:

## 2022-06-23 ENCOUNTER — Encounter: Payer: Self-pay | Admitting: Pulmonary Disease

## 2022-06-24 ENCOUNTER — Other Ambulatory Visit: Payer: Self-pay | Admitting: Internal Medicine

## 2022-07-06 ENCOUNTER — Ambulatory Visit (HOSPITAL_BASED_OUTPATIENT_CLINIC_OR_DEPARTMENT_OTHER): Payer: Medicare HMO | Attending: Pulmonary Disease | Admitting: Pulmonary Disease

## 2022-07-06 DIAGNOSIS — G4733 Obstructive sleep apnea (adult) (pediatric): Secondary | ICD-10-CM | POA: Insufficient documentation

## 2022-07-17 DIAGNOSIS — G4733 Obstructive sleep apnea (adult) (pediatric): Secondary | ICD-10-CM

## 2022-07-17 NOTE — Procedures (Signed)
Patient Name: Odean, Quillen Date: 07/06/2022 Gender: Female D.O.B: March 21, 1950 Age (years): 79 Referring Provider: Cyril Mourning MD, ABSM Height (inches): 64 Interpreting Physician: Cyril Mourning MD, ABSM Weight (lbs): 164 RPSGT: Shelah Lewandowsky BMI: 28 MRN: [id number] Neck Size: 15.75 <br> <br> CLINICAL INFORMATION The patient is referred for a CPAP titration to treat sleep apnea.    Date of NPSG 01/2011: AHI 25/hr  SLEEP STUDY TECHNIQUE As per the AASM Manual for the Scoring of Sleep and Associated Events v2.3 (April 2016) with a hypopnea requiring 4% desaturations.  The channels recorded and monitored were frontal, central and occipital EEG, electrooculogram (EOG), submentalis EMG (chin), nasal and oral airflow, thoracic and abdominal wall motion, anterior tibialis EMG, snore microphone, electrocardiogram, and pulse oximetry. Continuous positive airway pressure (CPAP) was initiated at the beginning of the study and titrated to treat sleep-disordered breathing.  MEDICATIONS Medications self-administered by patient taken the night of the study : N/A  RESPIRATORY PARAMETERS Optimal amplitude (v) 2.4 AHI at Optimal Pressure (/hr): 6.6 Overall Minimal O2 (%): 84.0 Supine % at Optimal Pressure (%): 3 Minimal O2 at Optimal Pressure (%): 87.0   SLEEP ARCHITECTURE The study was initiated at 10:32:55 PM and ended at 5:15:58 AM.  Sleep onset time was 55.4 minutes and the sleep efficiency was 75.8%. The total sleep time was 305.6 minutes.  The patient spent 12.6% of the night in stage N1 sleep, 75.6% in stage N2 sleep, 0.0% in stage N3 and 11.8% in REM.Stage REM latency was 127.0 minutes  Wake after sleep onset was 42.0. Alpha intrusion was absent. Supine sleep was 4.96%.  CARDIAC DATA The 2 lead EKG demonstrated sinus rhythm. The mean heart rate was 90.7 beats per minute. Other EKG findings include: PVCs.   LEG MOVEMENT DATA The total Periodic Limb Movements of Sleep  (PLMS) were 0. The PLMS index was 0.0. A PLMS index of <15 is considered normal in adults.  IMPRESSIONS - Incoming amplitude was 2.2 V. Inspire device was titrated between 2.0 and 2.5 V. The optimal therapeutic amplitude was 2.4V. - Moderate oxygen desaturations were observed during this titration (min O2 = 84.0%). - The patient snored with moderate snoring volume during this titration study. Snoring was noted at the final level - 2-lead EKG demonstrated: PVCs - Clinically significant periodic limb movements were not noted during this study. Arousals associated with PLMs were rare.   DIAGNOSIS - Obstructive Sleep Apnea (G47.33)   RECOMMENDATIONS - Trial of Inspire at therapeutic amplitude of 2.4 V. After the study, patient was programmed back to the incoming amplitude of 2.2 V with lower limit at 2.0 V & upper limit at 2.9 V - Avoid alcohol, sedatives and other CNS depressants that may worsen sleep apnea and disrupt normal sleep architecture. - Sleep hygiene should be reviewed to assess factors that may improve sleep quality. - Weight management and regular exercise should be initiated or continued. - Return to [facility] Center for re-evaluation after 4 weeks of therapy   Cyril Mourning MD Board Certified in Sleep medicine

## 2022-07-19 ENCOUNTER — Encounter (HOSPITAL_BASED_OUTPATIENT_CLINIC_OR_DEPARTMENT_OTHER): Payer: Self-pay | Admitting: Pulmonary Disease

## 2022-07-19 ENCOUNTER — Ambulatory Visit (HOSPITAL_BASED_OUTPATIENT_CLINIC_OR_DEPARTMENT_OTHER): Payer: Medicare HMO | Admitting: Pulmonary Disease

## 2022-07-19 VITALS — BP 120/72 | HR 65 | Ht 64.0 in | Wt 161.6 lb

## 2022-07-19 DIAGNOSIS — G4733 Obstructive sleep apnea (adult) (pediatric): Secondary | ICD-10-CM

## 2022-07-19 NOTE — Patient Instructions (Signed)
You are at level 1= 2.2 V  Increase by 1 level every week to level 3  Call me to report in 87month

## 2022-07-19 NOTE — Progress Notes (Signed)
   Subjective:    Patient ID: Esparanza Vanhout Heins, female    DOB: [birth year], 72 y.o.   MRN: [id number]  HPI 72 yo for follow-up of OSA She was diagnosed in 2012.  Initially treated with dental appliance but then switched to CPAP.    She was intolerant of CPAP and underwent implantation of hypoglossal nerve stimulator device 01/13/22 at [facility] Activated 03/28/22 :Stimulation level : Sensation 1.5 V , functional level 1.6 V lower limit, upper limit 2.6 V Start delay 30 minutes, pause time 15 minutes, duration 8 hours  We reviewed her sleep study today.  Therapeutic amplitude was 2.4 V After the study, patient was programmed back to the incoming amplitude of 2.2 V with lower limit at 2.0 V & upper limit at 2.9 V   She states that at the current level of 2.2 V, family members have noted snoring and she does not feel fully rested. She also complains that the device moves in the pocket when she is sleeping on her side  Significant tests/ events reviewed  Inspire titration 06/2022 >>Incoming amplitude was 2.2 V. Inspire device was titrated between 2.0 and 2.5 V. The optimal therapeutic amplitude was 2.4V.  Residual AHI was about 6/hour and RDI was 16/hour  NPSG 01/2011:  AHI 25/hr 07/2021 HST   Review of Systems neg for any significant sore throat, dysphagia, itching, sneezing, nasal congestion or excess/ purulent secretions, fever, chills, sweats, unintended wt loss, pleuritic or exertional cp, hempoptysis, orthopnea pnd or change in chronic leg swelling. Also denies presyncope, palpitations, heartburn, abdominal pain, nausea, vomiting, diarrhea or change in bowel or urinary habits, dysuria,hematuria, rash, arthralgias, visual complaints, headache, numbness weakness or ataxia.     Objective:   Physical Exam  Gen. Pleasant, well-nourished, in no distress ENT - no thrush, no pallor/icterus,no post nasal drip Neck: No JVD, no thyromegaly, no carotid bruits Lungs: no use of accessory  muscles, no dullness to percussion, clear without rales or rhonchi  Cardiovascular: Rhythm regular, heart sounds  normal, no murmurs or gallops, no peripheral edema Musculoskeletal: No deformities, no cyanosis or clubbing        Assessment & Plan:

## 2022-07-19 NOTE — Assessment & Plan Note (Signed)
Hypoglossal nerve stimulator was interrogated and settings adjusted.  tongue protrusion was examined Programming : The activation workflow was followed 1.  Stimulation level : Adjusted to level 2.2 V lower limit, upper limit 2.6 V 2.  Start delay 30 minutes, pause time 15 minutes, duration 9 hours 3.  Sensing waveform was analyzed for 3 minutes 4.  Sleep remote education was provided patient demonstrated competency with the remote and was aware of patient Instruction videos and sleep remote guide 5.  Patient was instructed to step up levels by 1 level (0.1 V ) every week until level 3=2.4 V, then call back to report 6.  Download was reviewed which confirms that patient is using therapy " all night, every night"

## 2022-07-21 ENCOUNTER — Ambulatory Visit (HOSPITAL_BASED_OUTPATIENT_CLINIC_OR_DEPARTMENT_OTHER)
Admission: RE | Admit: 2022-07-21 | Discharge: 2022-07-21 | Disposition: A | Discharge status: Home / Self Care | Payer: Medicare HMO | Source: Ambulatory Visit | Attending: Cardiology | Admitting: Cardiology

## 2022-07-21 DIAGNOSIS — Z122 Encounter for screening for malignant neoplasm of respiratory organs: Secondary | ICD-10-CM | POA: Insufficient documentation

## 2022-07-21 DIAGNOSIS — Z87891 Personal history of nicotine dependence: Secondary | ICD-10-CM | POA: Diagnosis not present

## 2022-07-26 ENCOUNTER — Other Ambulatory Visit: Payer: Self-pay

## 2022-07-26 DIAGNOSIS — Z87891 Personal history of nicotine dependence: Secondary | ICD-10-CM

## 2022-07-26 DIAGNOSIS — Z122 Encounter for screening for malignant neoplasm of respiratory organs: Secondary | ICD-10-CM

## 2022-08-11 ENCOUNTER — Other Ambulatory Visit: Payer: Self-pay | Admitting: Internal Medicine

## 2022-09-07 ENCOUNTER — Telehealth: Payer: Self-pay | Admitting: Pulmonary Disease

## 2022-09-07 NOTE — Telephone Encounter (Signed)
Pt calling in to understand why she was billed the way she was, I explained to her it may be bc [facility] is considered a emergency room that billing can be inputted differently, pt wants to speak with someone to go over why , she spoke with billing and got no where, pls advise

## 2022-09-21 DIAGNOSIS — E119 Type 2 diabetes mellitus without complications: Secondary | ICD-10-CM | POA: Diagnosis not present

## 2022-09-21 DIAGNOSIS — M8588 Other specified disorders of bone density and structure, other site: Secondary | ICD-10-CM | POA: Diagnosis not present

## 2022-09-21 DIAGNOSIS — E785 Hyperlipidemia, unspecified: Secondary | ICD-10-CM | POA: Diagnosis not present

## 2022-10-02 ENCOUNTER — Other Ambulatory Visit: Payer: Medicare HMO

## 2022-10-02 DIAGNOSIS — E559 Vitamin D deficiency, unspecified: Secondary | ICD-10-CM | POA: Diagnosis not present

## 2022-10-02 DIAGNOSIS — Z1329 Encounter for screening for other suspected endocrine disorder: Secondary | ICD-10-CM

## 2022-10-02 DIAGNOSIS — Z0000 Encounter for general adult medical examination without abnormal findings: Secondary | ICD-10-CM

## 2022-10-02 DIAGNOSIS — E119 Type 2 diabetes mellitus without complications: Secondary | ICD-10-CM | POA: Diagnosis not present

## 2022-10-02 DIAGNOSIS — E782 Mixed hyperlipidemia: Secondary | ICD-10-CM

## 2022-10-02 LAB — CBC WITH DIFFERENTIAL/PLATELET
Eosinophils Relative: 2.9 %
MCH: 27.8 pg (ref 27.0–33.0)
MCHC: 32.5 g/dL (ref 32.0–36.0)
Monocytes Relative: 10.5 %
Neutrophils Relative %: 59.3 %
Platelets: 224 10*3/uL (ref 140–400)
RBC: 5.22 10*6/uL — ABNORMAL HIGH (ref 3.80–5.10)
RDW: 14 % (ref 11.0–15.0)
Total Lymphocyte: 26.9 %
WBC: 4.8 10*3/uL (ref 3.8–10.8)

## 2022-10-02 NOTE — Progress Notes (Signed)
Annual Wellness Visit    Patient Care Team: Jassmin Kemmerer, Luanna Cole, MD as PCP - General (Internal Medicine) Jodelle Red, MD as PCP - Cardiology (Cardiology)  Visit Date: 10/03/22   Chief Complaint  Patient presents with   Medicare Annual Wellness Exam    Pt will schedule mammogram. Requesting eye exam from [facility].     Subjective:   Patient: Dawn Herrera [facility], Female    DOB: [birth year], 72 y.o.   MRN: [id number]  Kirstie Larsen Heins is a 72 y.o. Female who presents today for her Annual Wellness Visit. History of impaired glucose tolerance, hyperlipidemia, COPD, benign microscopic hematuria, osteopenia and adenomatous polyps.  Today she states she is feeling good. No falls in the past year. She denies memory issues, no depression.  She routinely follows with her endocrinologist, Dr. Roanna Raider. Next follow-up in 3 months. A1c 6.3%. Fasting glucose 116, liver function normal.  History of obstructive sleep apnea and had failed CPAP.  She was evaluated by Dr. Verne Spurr in [facility] and underwent hypoglossal nerve stimulator implantation in November 2023. Sleep apnea device seems to be working, she does feel rested but notes that the procedure was painful. She complains of being able to feel the device moving around with movements such as rolling onto her side.   History of glaucoma. She has regular eye exams every 6 months.  History of vitamin D deficiency. Vitamin D was 80 as of 10/02/2022.  Patient is a former smoker and gets CT chest lung cancer screening through Pulmonology.  History of sensorineural hearing loss. History of bilateral hearing loss and wears bilateral hearing aids.    History of positional vertigo October 2015 that responded to Epley maneuver.   Remote history of left plantar fasciitis and has seen podiatrist at [facility].   Has seen Dr. Darvin Neighbours for microscopic hematuria in 2007 with normal cystoscopy and ureteroscopy.   Saw Dr. Teressa Senter in 2009 for  impingement of right shoulder with mild adhesive capsulitis.  In June 2015 had an episode of epigastric pain and was diagnosed with H. pylori gastritis and was treated with 3 drug regimen.  Endoscopy showed duodenitis and gastritis along with hiatal hernia.   History of adenomatous colon polyp.  Had colonoscopy in 2007 showing 1 small adenomatous polyp in the descending colon.  She had repeat colonoscopy in January 2012 showing hyperplastic polyps by Dr. Bosie Clos and [facility].   No known drug allergies.   Social history: She is working for [employer] a couple of days a week.  She has 2 adult daughters.  Previously smoked a half to a pack of cigarettes daily for over 30 years and quit smoking about 6 years ago.  Social alcohol consumption.   Family history: Father died at age 86 with congestive heart failure with history of diabetes.  Mother with coronary artery disease, MI status post CABG.  1 brother and 4 sisters in good health.   She had coronary calcium scoring in January 2023 and total score was 7.63.   Past Medical History:  Diagnosis Date   Adenomatous polyp    Allergic rhinitis    Depression    Hyperlipidemia    Prediabetes    Sleep apnea    wears mouth guard     Family History  Problem Relation Age of Onset   Heart disease Father    Diabetes Other      Social History   Social History Narrative   Not on file  Review of Systems  Constitutional:  Negative for fever and malaise/fatigue.  HENT:  Negative for congestion.   Eyes:  Negative for blurred vision.  Respiratory:  Negative for cough and shortness of breath.   Cardiovascular:  Negative for chest pain, palpitations and leg swelling.  Gastrointestinal:  Negative for vomiting.  Musculoskeletal:  Negative for back pain and falls.  Skin:  Negative for rash.  Neurological:  Negative for loss of consciousness and headaches.  Psychiatric/Behavioral:  Negative for depression.       Objective:    Vitals: BP 118/78 (BP Location: Right Arm, Patient Position: Sitting, Cuff Size: Large)   Pulse 91   Temp 97.6 F (36.4 C) (Tympanic)   Ht 5\' 4"  (1.626 m)   Wt 161 lb 6.4 oz (73.2 kg)   LMP 03/14/1995 (Approximate)   SpO2 96%   BMI 27.70 kg/m   Physical Exam Vitals and nursing note reviewed.  Constitutional:      General: She is not in acute distress.    Appearance: Normal appearance. She is not ill-appearing or toxic-appearing.  HENT:     Head: Normocephalic and atraumatic.     Right Ear: Hearing, tympanic membrane, ear canal and external ear normal.     Left Ear: Hearing, tympanic membrane, ear canal and external ear normal.     Mouth/Throat:     Pharynx: Oropharynx is clear.  Eyes:     Extraocular Movements: Extraocular movements intact.     Pupils: Pupils are equal, round, and reactive to light.  Neck:     Thyroid: No thyroid mass, thyromegaly or thyroid tenderness.     Vascular: No carotid bruit.  Cardiovascular:     Rate and Rhythm: Normal rate and regular rhythm. No extrasystoles are present.    Pulses:          Dorsalis pedis pulses are 1+ on the right side and 1+ on the left side.     Heart sounds: Normal heart sounds. No murmur heard.    No friction rub. No gallop.     Comments: Soft, regular rate and rhythm without ectopy. Pulmonary:     Effort: Pulmonary effort is normal.     Breath sounds: Normal breath sounds. No decreased breath sounds, wheezing, rhonchi or rales.  Chest:     Chest wall: No mass.  Abdominal:     Palpations: Abdomen is soft. There is no hepatomegaly, splenomegaly or mass.     Tenderness: There is no abdominal tenderness.     Hernia: No hernia is present.  Musculoskeletal:     Cervical back: Normal range of motion.     Right lower leg: No edema.     Left lower leg: No edema.  Lymphadenopathy:     Cervical: No cervical adenopathy.     Upper Body:     Right upper body: No supraclavicular adenopathy.     Left upper body: No  supraclavicular adenopathy.  Skin:    General: Skin is warm and dry.  Neurological:     General: No focal deficit present.     Mental Status: She is alert and oriented to person, place, and time. Mental status is at baseline.     Sensory: Sensation is intact.     Motor: Motor function is intact. No weakness.     Deep Tendon Reflexes: Reflexes are normal and symmetric.  Psychiatric:        Attention and Perception: Attention normal.        Mood and Affect: Mood  normal.        Speech: Speech normal.        Behavior: Behavior normal.        Thought Content: Thought content normal.        Cognition and Memory: Cognition normal.        Judgment: Judgment normal.      Most recent functional status assessment:     No data to display         Most recent fall risk assessment:    10/03/2022   11:17 AM  Fall Risk   Falls in the past year? 0  Follow up Falls evaluation completed    Most recent depression screenings:    10/03/2022   11:18 AM 05/23/2022   12:01 PM  PHQ 2/9 Scores  PHQ - 2 Score 0 0   Most recent cognitive screening:    09/26/2021    2:53 PM  6CIT Screen  What Year? 0 points  What month? 0 points  What time? 0 points  Count back from 20 0 points  Months in reverse 0 points  Repeat phrase 0 points  Total Score 0 points     Results:   Studies obtained and personally reviewed by me:   Labs:       Component Value Date/Time   NA 142 10/02/2022 0911   K 4.2 10/02/2022 0911   CL 108 10/02/2022 0911   CO2 26 10/02/2022 0911   GLUCOSE 116 (H) 10/02/2022 0911   BUN 14 10/02/2022 0911   CREATININE 0.97 10/02/2022 0911   CALCIUM 10.1 10/02/2022 0911   PROT 6.8 10/02/2022 0911   ALBUMIN 4.2 09/26/2016 0917   AST 24 10/02/2022 0911   ALT 22 10/02/2022 0911   ALKPHOS 104 09/26/2016 0917   BILITOT 0.5 10/02/2022 0911   GFRNONAA 61 09/02/2019 0931   GFRAA 70 09/02/2019 0931     Lab Results  Component Value Date   WBC 4.8 10/02/2022   HGB 14.5  10/02/2022   HCT 44.6 10/02/2022   MCV 85.4 10/02/2022   PLT 224 10/02/2022    Lab Results  Component Value Date   CHOL 154 10/02/2022   HDL 57 10/02/2022   LDLCALC 69 10/02/2022   TRIG 221 (H) 10/02/2022   CHOLHDL 2.7 10/02/2022    Lab Results  Component Value Date   HGBA1C 6.3 (H) 10/02/2022     Lab Results  Component Value Date   TSH 4.12 10/02/2022     Assessment & Plan:   Impaired glucose tolerance:  Was started on Januvia 50 mg daily in July 2022.  Hemoglobin A1c 6.3% as of 10/02/2022. Fasting glucose 116, liver function normal. She routinely follows with her endocrinologist, Dr. Roanna Raider. Next follow-up in 3 months.   Diabetic foot exam was performed today. DP pulses intact bilaterally, 1+ posterior tibial pulses, no ulceration or lesions of feet, sensation intact bilaterally. She reports that her feet become cold easily.   Pure hypercholesterolemia: Lipid panel showed triglycerides elevated to 221. On rosuvastatin 40 mg daily.  History of sleep apnea: Previously had failed CPAP.  She was evaluated by Dr. Verne Spurr in [facility] and underwent hypoglossal nerve stimulator implantation in November 2023. She complains of feeling like the device doesn't stay in place with movements such as rolling onto her side.   History of smoking and mild COPD:  Quit smoking some 7 years ago   History of grief reaction:  lost husband due to sudden cardiac event   History of  glaucoma:   She has regular eye exams every 6 months.   OBGYN: Recommend follow up with OBGYN. She will look for a specialist in [facility]. She is amenable to having a mammogram, but has been concerned about this given the location of her hypoglossal nerve stimulator.    Routine urinalysis performed today which was negative.   Return in 6 months for follow-up visit.     Annual wellness visit done today including the all of the following: Reviewed patient's Family Medical History Reviewed and updated list of  patient's medical providers Assessment of cognitive impairment was done Assessed patient's functional ability Established a written schedule for health screening services Health Risk Assessent Completed and Reviewed  Discussed health benefits of physical activity, and encouraged her to engage in regular exercise appropriate for her age and condition.      I,Mathew Stumpf,acting as a Neurosurgeon for Margaree Mackintosh, MD.,have documented all relevant documentation on the behalf of Margaree Mackintosh, MD,as directed by  Margaree Mackintosh, MD while in the presence of Margaree Mackintosh, MD.   I, Margaree Mackintosh, MD, have reviewed all documentation for this visit. The documentation on 10/11/22 for the exam, diagnosis, procedures, and orders are all accurate and complete.

## 2022-10-03 ENCOUNTER — Encounter: Payer: Self-pay | Admitting: Internal Medicine

## 2022-10-03 ENCOUNTER — Ambulatory Visit (INDEPENDENT_AMBULATORY_CARE_PROVIDER_SITE_OTHER): Payer: Medicare HMO | Admitting: Internal Medicine

## 2022-10-03 VITALS — BP 118/78 | HR 91 | Temp 97.6°F | Ht 64.0 in | Wt 161.4 lb

## 2022-10-03 DIAGNOSIS — E782 Mixed hyperlipidemia: Secondary | ICD-10-CM

## 2022-10-03 DIAGNOSIS — Z8669 Personal history of other diseases of the nervous system and sense organs: Secondary | ICD-10-CM

## 2022-10-03 DIAGNOSIS — Z9682 Presence of neurostimulator: Secondary | ICD-10-CM | POA: Diagnosis not present

## 2022-10-03 DIAGNOSIS — G4730 Sleep apnea, unspecified: Secondary | ICD-10-CM

## 2022-10-03 DIAGNOSIS — Z0000 Encounter for general adult medical examination without abnormal findings: Secondary | ICD-10-CM | POA: Diagnosis not present

## 2022-10-03 DIAGNOSIS — Z87891 Personal history of nicotine dependence: Secondary | ICD-10-CM | POA: Diagnosis not present

## 2022-10-03 DIAGNOSIS — G4733 Obstructive sleep apnea (adult) (pediatric): Secondary | ICD-10-CM | POA: Diagnosis not present

## 2022-10-03 DIAGNOSIS — E1169 Type 2 diabetes mellitus with other specified complication: Secondary | ICD-10-CM | POA: Diagnosis not present

## 2022-10-03 DIAGNOSIS — H903 Sensorineural hearing loss, bilateral: Secondary | ICD-10-CM

## 2022-10-03 DIAGNOSIS — Z860101 Personal history of adenomatous and serrated colon polyps: Secondary | ICD-10-CM

## 2022-10-03 DIAGNOSIS — Z6827 Body mass index (BMI) 27.0-27.9, adult: Secondary | ICD-10-CM

## 2022-10-03 DIAGNOSIS — Z86010 Personal history of colonic polyps: Secondary | ICD-10-CM

## 2022-10-03 LAB — COMPLETE METABOLIC PANEL WITH GFR
AG Ratio: 1.6 (calc) (ref 1.0–2.5)
ALT: 22 U/L (ref 6–29)
AST: 24 U/L (ref 10–35)
Albumin: 4.2 g/dL (ref 3.6–5.1)
Alkaline phosphatase (APISO): 93 U/L (ref 37–153)
BUN: 14 mg/dL (ref 7–25)
CO2: 26 mmol/L (ref 20–32)
Calcium: 10.1 mg/dL (ref 8.6–10.4)
Chloride: 108 mmol/L (ref 98–110)
Creat: 0.97 mg/dL (ref 0.60–1.00)
Globulin: 2.6 g/dL (calc) (ref 1.9–3.7)
Glucose, Bld: 116 mg/dL — ABNORMAL HIGH (ref 65–99)
Potassium: 4.2 mmol/L (ref 3.5–5.3)
Sodium: 142 mmol/L (ref 135–146)
Total Bilirubin: 0.5 mg/dL (ref 0.2–1.2)
Total Protein: 6.8 g/dL (ref 6.1–8.1)
eGFR: 62 mL/min/{1.73_m2} (ref >=60)

## 2022-10-03 LAB — LIPID PANEL
Cholesterol: 154 mg/dL (ref <200)
HDL: 57 mg/dL (ref >=50)
LDL Cholesterol (Calc): 69 mg/dL (calc)
Non-HDL Cholesterol (Calc): 97 mg/dL (calc) (ref <130)
Total CHOL/HDL Ratio: 2.7 (calc) (ref <5.0)
Triglycerides: 221 mg/dL — ABNORMAL HIGH (ref <150)

## 2022-10-03 LAB — TSH: TSH: 4.12 mIU/L (ref 0.40–4.50)

## 2022-10-03 LAB — POC URINALSYSI DIPSTICK (AUTOMATED)
Bilirubin, UA: NEGATIVE
Blood, UA: NEGATIVE
Glucose, UA: NEGATIVE
Ketones, UA: NEGATIVE
Leukocytes, UA: NEGATIVE
Nitrite, UA: NEGATIVE
Protein, UA: NEGATIVE
Spec Grav, UA: 1.01 (ref 1.010–1.025)
Urobilinogen, UA: 0.2 E.U./dL
pH, UA: 6 (ref 5.0–8.0)

## 2022-10-03 LAB — CBC WITH DIFFERENTIAL/PLATELET
Absolute Monocytes: 504 cells/uL (ref 200–950)
Basophils Absolute: 19 cells/uL (ref 0–200)
Basophils Relative: 0.4 %
Eosinophils Absolute: 139 cells/uL (ref 15–500)
HCT: 44.6 % (ref 35.0–45.0)
Hemoglobin: 14.5 g/dL (ref 11.7–15.5)
Lymphs Abs: 1291 cells/uL (ref 850–3900)
MCV: 85.4 fL (ref 80.0–100.0)
MPV: 11.2 fL (ref 7.5–12.5)
Neutro Abs: 2846 cells/uL (ref 1500–7800)

## 2022-10-03 LAB — VITAMIN D 25 HYDROXY (VIT D DEFICIENCY, FRACTURES): Vit D, 25-Hydroxy: 80 ng/mL (ref 30–100)

## 2022-10-03 LAB — MICROALBUMIN / CREATININE URINE RATIO
Creatinine, Urine: 75 mg/dL (ref 20–275)
Microalb Creat Ratio: 4 mg/g creat (ref <30)
Microalb, Ur: 0.3 mg/dL

## 2022-10-03 LAB — HEMOGLOBIN A1C
Hgb A1c MFr Bld: 6.3 % of total Hgb — ABNORMAL HIGH (ref <5.7)
Mean Plasma Glucose: 134 mg/dL
eAG (mmol/L): 7.4 mmol/L

## 2022-10-03 MED ORDER — ROSUVASTATIN CALCIUM 40 MG PO TABS: 40.0000 mg | ORAL_TABLET | Freq: Every day | ORAL | 3 refills | Status: DC

## 2022-10-10 ENCOUNTER — Telehealth: Payer: Self-pay | Admitting: Internal Medicine

## 2022-10-10 ENCOUNTER — Other Ambulatory Visit (HOSPITAL_BASED_OUTPATIENT_CLINIC_OR_DEPARTMENT_OTHER): Payer: Self-pay | Admitting: Internal Medicine

## 2022-10-10 ENCOUNTER — Other Ambulatory Visit: Payer: Self-pay

## 2022-10-10 DIAGNOSIS — Z13820 Encounter for screening for osteoporosis: Secondary | ICD-10-CM

## 2022-10-10 DIAGNOSIS — M810 Age-related osteoporosis without current pathological fracture: Secondary | ICD-10-CM

## 2022-10-10 DIAGNOSIS — Z1231 Encounter for screening mammogram for malignant neoplasm of breast: Secondary | ICD-10-CM

## 2022-10-10 NOTE — Telephone Encounter (Signed)
Bunny called would like an order put in for her to get a Bone Density done at the [facility]

## 2022-10-11 ENCOUNTER — Encounter: Payer: Self-pay | Admitting: Internal Medicine

## 2022-10-11 NOTE — Patient Instructions (Addendum)
Continue follow-up with Dr. Roanna Raider for impaired glucose tolerance and hyperlipidemia.  Continue rosuvastatin.  Januvia is working well for you.  Continue use of Inspire device for sleep apnea.  Continue with annual lung cancer chest CT screening.  Please return in 6 months.  It was a pleasure to see you today.

## 2022-10-16 ENCOUNTER — Encounter (HOSPITAL_BASED_OUTPATIENT_CLINIC_OR_DEPARTMENT_OTHER): Payer: Self-pay

## 2022-10-16 ENCOUNTER — Ambulatory Visit (HOSPITAL_BASED_OUTPATIENT_CLINIC_OR_DEPARTMENT_OTHER)
Admission: RE | Admit: 2022-10-16 | Discharge: 2022-10-16 | Disposition: A | Discharge status: Home / Self Care | Payer: Medicare HMO | Source: Ambulatory Visit | Attending: Internal Medicine | Admitting: Internal Medicine

## 2022-10-16 DIAGNOSIS — Z780 Asymptomatic menopausal state: Secondary | ICD-10-CM | POA: Diagnosis not present

## 2022-10-16 DIAGNOSIS — Z1231 Encounter for screening mammogram for malignant neoplasm of breast: Secondary | ICD-10-CM | POA: Diagnosis not present

## 2022-10-16 DIAGNOSIS — M810 Age-related osteoporosis without current pathological fracture: Secondary | ICD-10-CM | POA: Diagnosis not present

## 2022-10-24 ENCOUNTER — Encounter (HOSPITAL_BASED_OUTPATIENT_CLINIC_OR_DEPARTMENT_OTHER): Payer: Self-pay | Admitting: Pulmonary Disease

## 2022-11-01 IMAGING — MG MM DIGITAL SCREENING BILAT W/ TOMO AND CAD
8 series · 8 of 24 positions shown · non-contrast
Comparison: Previous exam(s).

CLINICAL DATA: Screening.

EXAM:
DIGITAL SCREENING BILATERAL MAMMOGRAM WITH TOMOSYNTHESIS AND CAD
TECHNIQUE: Bilateral screening digital craniocaudal and mediolateral oblique
mammograms were obtained. Bilateral screening digital breast
tomosynthesis was performed. The images were evaluated with
computer-aided detection.

[L MLO synth-2D]
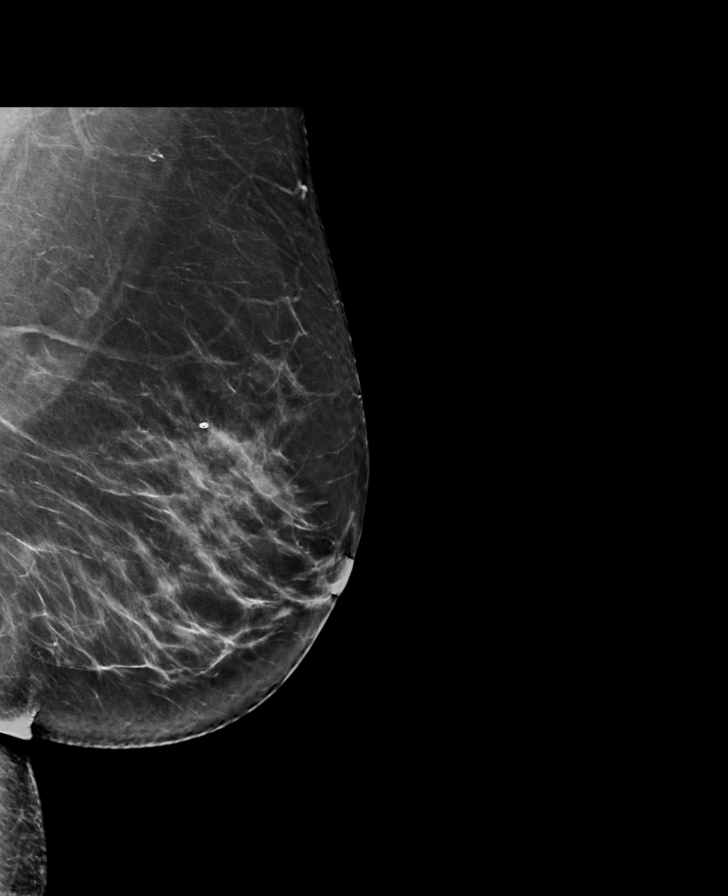

[R CC synth-2D]
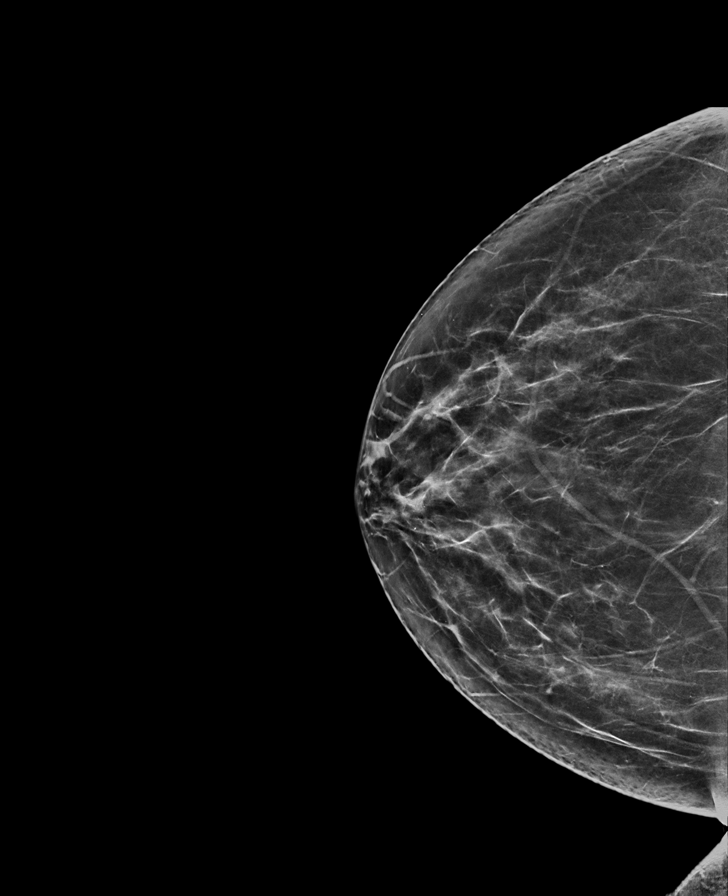

[R MLO synth-2D]
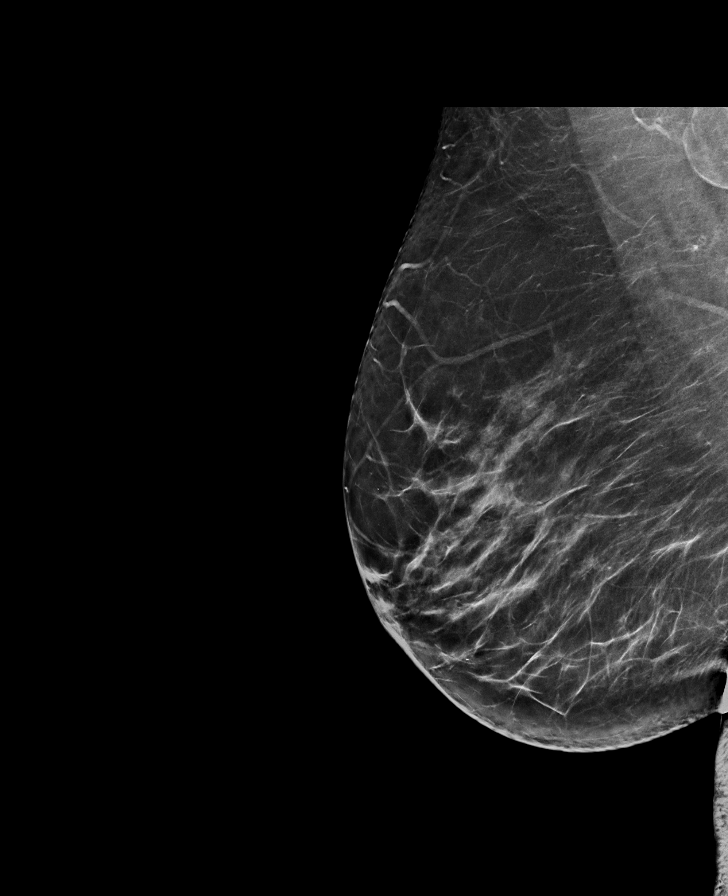

[L CC synth-2D]
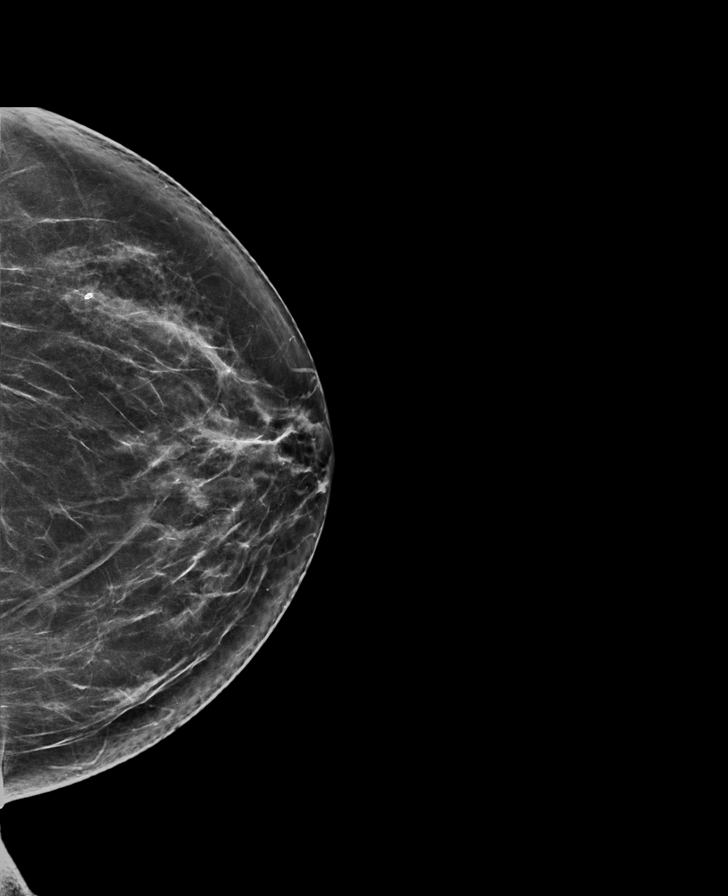

[L MLO tomo · tomo slice 43/86.0]
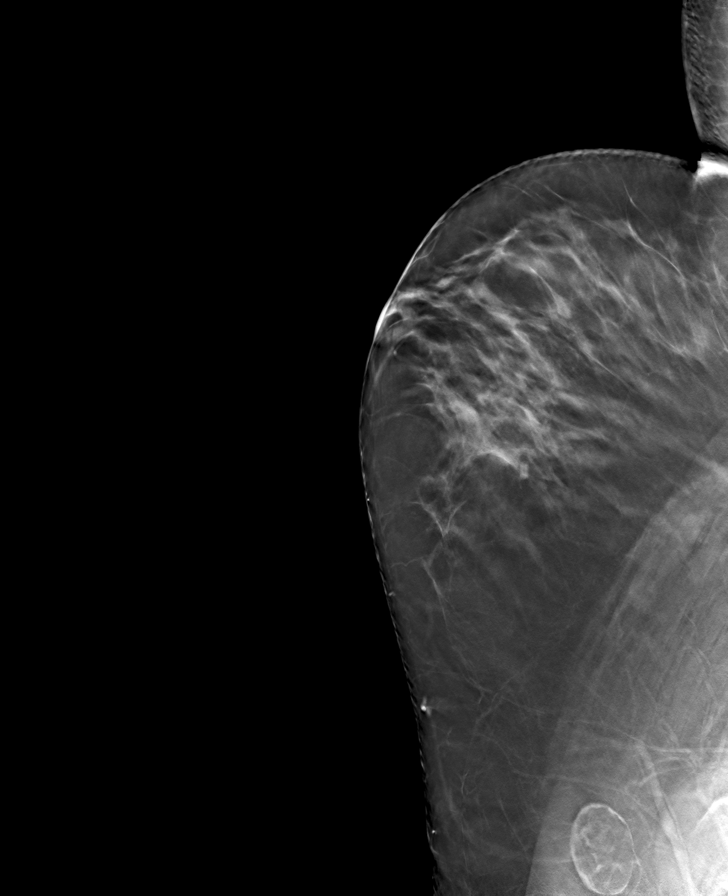

[R MLO tomo · tomo slice 39/78.0]
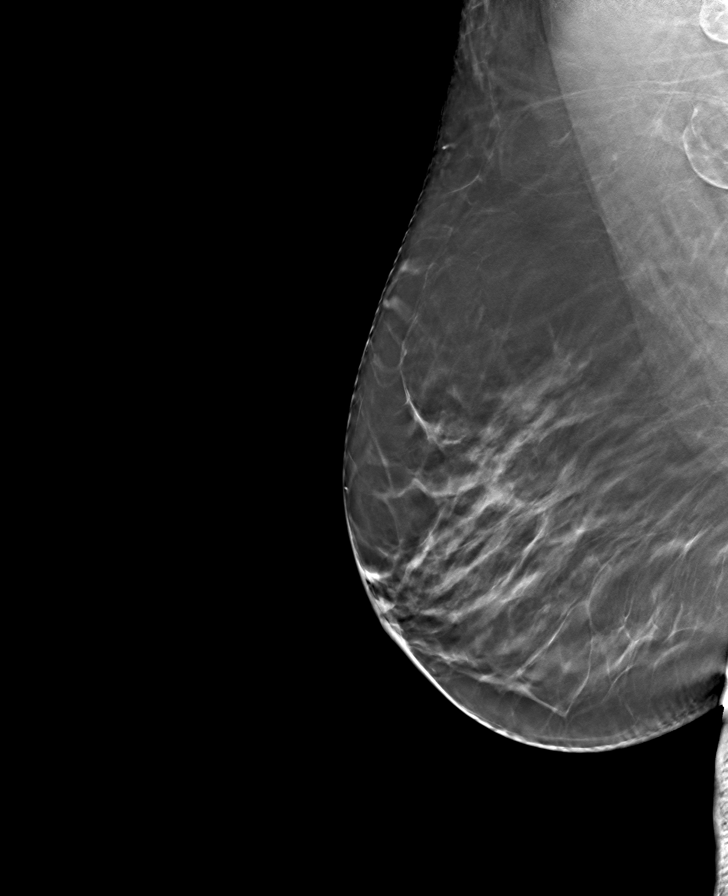

[R CC tomo · tomo slice 35/70.0]
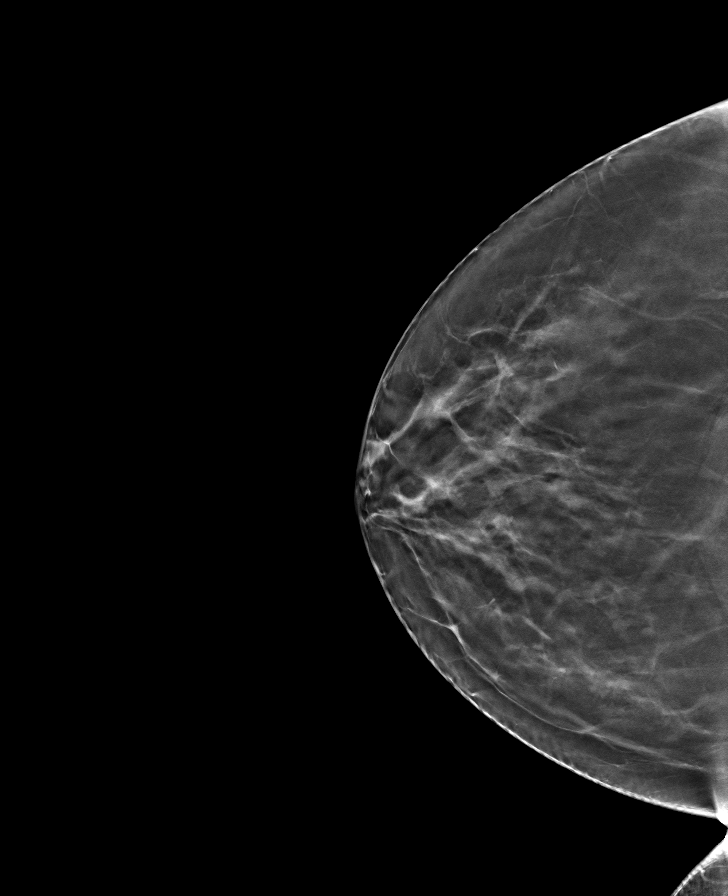

[L CC tomo · tomo slice 37/74.0]
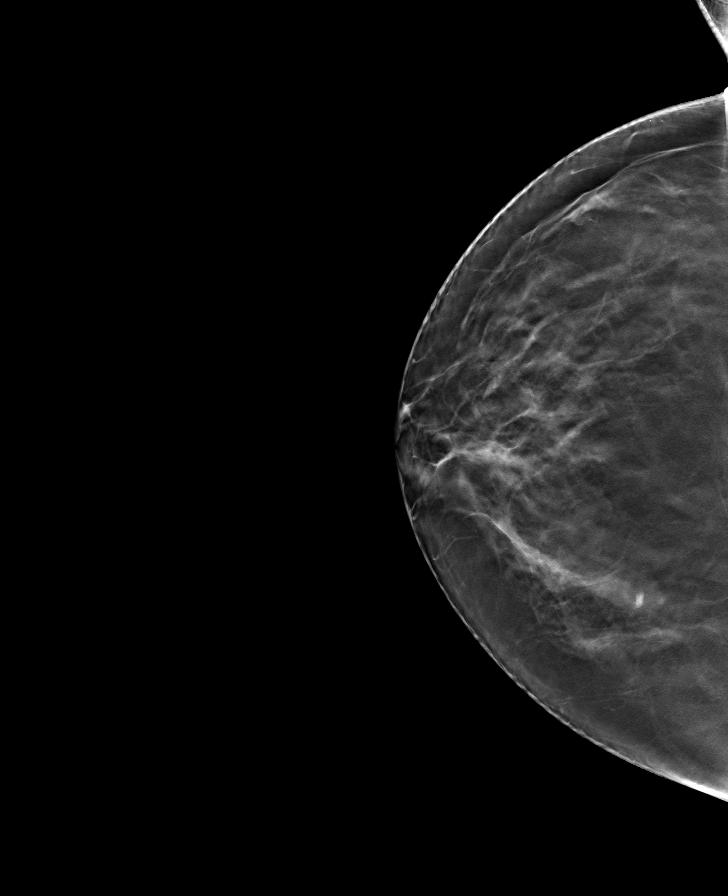

[8 of 24 positions shown; findings below may reference images not displayed]

ACR Breast Density Category b: There are scattered areas of
fibroglandular density.
FINDINGS: There are no findings suspicious for malignancy. The images were
evaluated with computer-aided detection.
IMPRESSION: No mammographic evidence of malignancy. A result letter of this
screening mammogram will be mailed directly to the patient.

RECOMMENDATION:
Screening mammogram in one year. (Code:[id number])

BI-RADS CATEGORY  1: Negative.

## 2022-11-23 DIAGNOSIS — H401131 Primary open-angle glaucoma, bilateral, mild stage: Secondary | ICD-10-CM | POA: Diagnosis not present

## 2022-11-23 DIAGNOSIS — H2513 Age-related nuclear cataract, bilateral: Secondary | ICD-10-CM | POA: Diagnosis not present

## 2022-11-23 DIAGNOSIS — H04123 Dry eye syndrome of bilateral lacrimal glands: Secondary | ICD-10-CM | POA: Diagnosis not present

## 2022-11-23 LAB — HM DIABETES EYE EXAM

## 2022-12-12 ENCOUNTER — Encounter: Payer: Self-pay | Admitting: Pulmonary Disease

## 2022-12-20 DIAGNOSIS — E785 Hyperlipidemia, unspecified: Secondary | ICD-10-CM | POA: Diagnosis not present

## 2022-12-20 DIAGNOSIS — E119 Type 2 diabetes mellitus without complications: Secondary | ICD-10-CM | POA: Diagnosis not present

## 2022-12-20 DIAGNOSIS — G4733 Obstructive sleep apnea (adult) (pediatric): Secondary | ICD-10-CM | POA: Diagnosis not present

## 2023-01-24 ENCOUNTER — Encounter (HOSPITAL_BASED_OUTPATIENT_CLINIC_OR_DEPARTMENT_OTHER): Payer: Self-pay | Admitting: Pulmonary Disease

## 2023-01-24 ENCOUNTER — Ambulatory Visit (HOSPITAL_BASED_OUTPATIENT_CLINIC_OR_DEPARTMENT_OTHER): Payer: Medicare HMO | Admitting: Pulmonary Disease

## 2023-01-24 VITALS — BP 114/68 | HR 78 | Resp 16 | Ht 64.0 in | Wt 158.0 lb

## 2023-01-24 DIAGNOSIS — G4733 Obstructive sleep apnea (adult) (pediatric): Secondary | ICD-10-CM

## 2023-01-24 NOTE — Assessment & Plan Note (Signed)
Hypoglossal nerve stimulator was reassessed today.  Goal of the follow-up visit was to ensure good compliance, good subjective benefit, good tongue motion and good sense lead waveforms .incision sites appear good, tongue protrusion was examined.  Download was reviewed and usage appears to be up to 8 hours average per night.   snoring is decreased and she has more energy.  She denies any discomfort.  She is at level 4 = 2.5 V Programming :  1.  Stimulation level : 2.2 V lower limit, upper limit 2.6 V.  We dropped her back down to 2.4 V 2.  Start delay is at 30 minutes, pause time 15 minutes, duration 8 hours.  Encouraged her to use the pause button when she wakes up 3.  Sensing waveform was analyzed for 3 minutes 4.  Sleep remote education was provided patient demonstrated competency with the remote and was aware of patient Instruction videos and sleep remote guide 5.  If she notices increased snoring, then she can increase to 2.5 V 6.   Impedance was checked on all electrode configurations and this confirmed that device was intact Download was reviewed to confirm good compliance with the device

## 2023-01-24 NOTE — Patient Instructions (Signed)
We adjusted your device down to level3 = 2.4 V  Monitor snoring at this level If worsens, ok to increase back to level 4 =2.5 V & call us to schedule home sleep test at this level

## 2023-01-24 NOTE — Progress Notes (Signed)
   Subjective:    Patient ID: Dawn Herrera, female    DOB: Oct 25, 1950, 72 y.o.   MRN: [id number]  HPI  72  yo for follow-up of OSA She was diagnosed in 2012.  Initially treated with dental appliance but then switched to CPAP.    She was intolerant of CPAP and underwent implantation of hypoglossal nerve stimulator device 01/13/22 at [facility] Activated 03/28/22 :Stimulation level : Sensation 1.5 V , functional level 1.6 V lower limit, upper limit 2.6 V Therapeutic amplitude was 2.4 V   Last OV 07/2022 >>Adjusted to level 2.2 V lower limit, upper limit 2.6 V   Start delay 30 minutes, pause time 15 minutes, duration 8 hours  29-month follow-up visit Family members noted loud snoring.  She then obtained a snoring app on her phone which confirmed increased snoring.  She is an increased to level 5= 2.5 V.  She shows me subsequent data about snoring primary asked which showed that snoring or decreased. She still feels that the generator moves around in its pocket and chest.  She takes a daily nap up to 1 hour in the afternoon. She reports good compliance with using the device. This is confirmed on download.  She uses the pause button about twice a week current settings are confirmed at 2.5 V with start delay 30 minutes, pause time 15 minutes in duration 8 hours Her question is that on the same night she sometimes feels greater tongue movement.  2 other times  Significant tests/ events reviewed   Inspire titration 06/2022 >>Incoming amplitude was 2.2 V. Inspire device was titrated between 2.0 and 2.5 V. The optimal therapeutic amplitude was 2.4V.  Residual AHI was about 6/hour and RDI was 16/hour   NPSG 01/2011:  AHI 25/hr 07/2021 HST  LDCT chest 07/2022 very mild emphysema  Review of Systems neg for any significant sore throat, dysphagia, itching, sneezing, nasal congestion or excess/ purulent secretions, fever, chills, sweats, unintended wt loss, pleuritic or exertional cp, hempoptysis,  orthopnea pnd or change in chronic leg swelling. Also denies presyncope, palpitations, heartburn, abdominal pain, nausea, vomiting, diarrhea or change in bowel or urinary habits, dysuria,hematuria, rash, arthralgias, visual complaints, headache, numbness weakness or ataxia.     Objective:   Physical Exam  Gen. Pleasant, well-nourished, in no distress ENT - no thrush, no pallor/icterus,no post nasal drip Neck: No JVD, no thyromegaly, no carotid bruits Lungs: no use of accessory muscles, no dullness to percussion, clear without rales or rhonchi  Cardiovascular: Rhythm regular, heart sounds  normal, no murmurs or gallops, no peripheral edema Musculoskeletal: No deformities, no cyanosis or clubbing        Assessment & Plan:

## 2023-02-16 ENCOUNTER — Encounter: Payer: Self-pay | Admitting: Internal Medicine

## 2023-02-16 NOTE — Telephone Encounter (Signed)
 Care team updated and letter sent for eye exam notes.

## 2023-04-09 ENCOUNTER — Other Ambulatory Visit: Payer: Medicare Other

## 2023-04-09 DIAGNOSIS — E1169 Type 2 diabetes mellitus with other specified complication: Secondary | ICD-10-CM

## 2023-04-10 ENCOUNTER — Ambulatory Visit (INDEPENDENT_AMBULATORY_CARE_PROVIDER_SITE_OTHER): Payer: Medicare Other | Admitting: Internal Medicine

## 2023-04-10 ENCOUNTER — Encounter: Payer: Self-pay | Admitting: Internal Medicine

## 2023-04-10 VITALS — BP 110/80 | Ht 64.0 in | Wt 161.0 lb

## 2023-04-10 DIAGNOSIS — G4733 Obstructive sleep apnea (adult) (pediatric): Secondary | ICD-10-CM

## 2023-04-10 DIAGNOSIS — H903 Sensorineural hearing loss, bilateral: Secondary | ICD-10-CM

## 2023-04-10 DIAGNOSIS — E1169 Type 2 diabetes mellitus with other specified complication: Secondary | ICD-10-CM | POA: Diagnosis not present

## 2023-04-10 DIAGNOSIS — Z9682 Presence of neurostimulator: Secondary | ICD-10-CM

## 2023-04-10 DIAGNOSIS — E782 Mixed hyperlipidemia: Secondary | ICD-10-CM

## 2023-04-10 LAB — LIPID PANEL
Cholesterol: 154 mg/dL (ref <200)
HDL: 63 mg/dL (ref >=50)
LDL Cholesterol (Calc): 63 mg/dL
Non-HDL Cholesterol (Calc): 91 mg/dL (ref <130)
Total CHOL/HDL Ratio: 2.4 (calc) (ref <5.0)
Triglycerides: 214 mg/dL — ABNORMAL HIGH (ref <150)

## 2023-04-10 LAB — HEPATIC FUNCTION PANEL
AG Ratio: 1.7 (calc) (ref 1.0–2.5)
ALT: 25 U/L (ref 6–29)
AST: 24 U/L (ref 10–35)
Albumin: 4.3 g/dL (ref 3.6–5.1)
Alkaline phosphatase (APISO): 108 U/L (ref 37–153)
Bilirubin, Direct: 0.1 mg/dL (ref 0.0–0.2)
Globulin: 2.5 g/dL (ref 1.9–3.7)
Indirect Bilirubin: 0.4 mg/dL (ref 0.2–1.2)
Total Bilirubin: 0.5 mg/dL (ref 0.2–1.2)
Total Protein: 6.8 g/dL (ref 6.1–8.1)

## 2023-04-10 LAB — HEMOGLOBIN A1C
Hgb A1c MFr Bld: 6.7 %{Hb} — ABNORMAL HIGH (ref <5.7)
Mean Plasma Glucose: 146 mg/dL
eAG (mmol/L): 8.1 mmol/L

## 2023-04-10 NOTE — Patient Instructions (Addendum)
It was a pleasure to see you today. Continue current meds. Lab results discussed. Will see podiatrist for toe pin.Return in 6 months for annual Medicare wellness visit.

## 2023-04-10 NOTE — Progress Notes (Addendum)
Patient Care Team: Margaree Mackintosh, MD as PCP - General (Internal Medicine) Jodelle Red, MD as PCP - Cardiology (Cardiology) Nelson Chimes, MD as Attending Physician (Ophthalmology)  Visit Date: 04/10/23  Subjective:   Chief Complaint  Patient presents with   Medical Management of Chronic Issues   Hyperlipidemia   Diabetes   Patient [name],Female DOB:1951-02-17,72 y.o. XBJ:[id number]   73 y.o. Female presents today for 6 months follow-up for HLD & DM type II. Last seen 10/02/22 for her annual visit. In the interim she has seen Dawn Herrera with Ophthalmology, Dawn Herrera with [facility] Endocrinology, and Dawn Herrera with Pulmonology.   History of Hyperlipidemia treated with 40 mg Rosuvastatin daily. 04/09/23 Lipid Panel TRIG 214, elevated but decreased from 221 on 10/02/22, otherwise WNL.   History of Diabetes Mellitus type II previously treated with Januvia and Metformin. Currently not on medication per patient request. Her 04/09/23 HgbA1c 6.7, increased from 6.5 on 10/02/22. Had diabetic eye exam 11/12/2022 with Dr. Lindi Herrera at [facility].   04/09/23 Hepatic Function Panel reviewed: WNL  Brings attention to pain in her 2nd & 3rd toe on her left foot that feels like she is stepping on electricity. Has previously seen Dr. Gala Herrera, DPM with [facility] in [place] and she would like to go back to him for evaluation - last seen in 2019 for plantar fascitis.    Notes that she will soon need to replace her hearing aids. Also has a toothache, but has a dental appointment for the end of the month to evaluate this.   Past Medical History:  Diagnosis Date   Adenomatous polyp    Allergic rhinitis    Depression    Hyperlipidemia    Prediabetes    Sleep apnea    wears mouth guard    Family History  Problem Relation Age of Onset   Heart disease Father    Diabetes Other    Social Hx: widow ,resides alone. Has friends and supportive  family. Would like to travel to [place] again soon.  Review of Systems  Constitutional:  Negative for fever and malaise/fatigue.  HENT:  Negative for congestion.   Eyes:  Negative for blurred vision.  Respiratory:  Negative for cough and shortness of breath.   Cardiovascular:  Negative for chest pain, palpitations and leg swelling.  Gastrointestinal:  Negative for vomiting.  Musculoskeletal:  Negative for back pain.       (+) Foot Pain - 2nd & 3rd toe on left foot feels like she is stepping on electricity  Skin:  Negative for rash.  Neurological:  Negative for loss of consciousness and headaches.     Objective:  Vitals: BP 110/80   Ht 5\' 4"  (1.626 m)   Wt 161 lb (73 kg)   LMP 03/14/1995 (Approximate)   BMI 27.64 kg/m   Physical Exam Vitals and nursing note reviewed.  Constitutional:      General: She is not in acute distress.    Appearance: Normal appearance. She is not toxic-appearing.  HENT:     Head: Normocephalic and atraumatic.  Cardiovascular:     Rate and Rhythm: Normal rate and regular rhythm. No extrasystoles are present.    Pulses: Normal pulses.     Heart sounds: Normal heart sounds. No murmur heard.    No friction rub. No gallop.  Pulmonary:     Effort: Pulmonary effort is normal. No respiratory distress.     Breath sounds: Normal breath  sounds. No wheezing or rales.  Skin:    General: Skin is warm and dry.  Neurological:     Mental Status: She is alert and oriented to person, place, and time. Mental status is at baseline.  Psychiatric:        Mood and Affect: Mood normal.        Behavior: Behavior normal.        Thought Content: Thought content normal.        Judgment: Judgment normal.     Results:  Studies Obtained And Personally Reviewed By Me: Labs:     Component Value Date/Time   NA 142 10/02/2022 0911   K 4.2 10/02/2022 0911   CL 108 10/02/2022 0911   CO2 26 10/02/2022 0911   GLUCOSE 116 (H) 10/02/2022 0911   BUN 14 10/02/2022 0911    CREATININE 0.97 10/02/2022 0911   CALCIUM 10.1 10/02/2022 0911   PROT 6.8 04/09/2023 0920   ALBUMIN 4.2 09/26/2016 0917   AST 24 04/09/2023 0920   ALT 25 04/09/2023 0920   ALKPHOS 104 09/26/2016 0917   BILITOT 0.5 04/09/2023 0920   GFRNONAA 61 09/02/2019 0931   GFRAA 70 09/02/2019 0931    Lab Results  Component Value Date   WBC 4.8 10/02/2022   HGB 14.5 10/02/2022   HCT 44.6 10/02/2022   MCV 85.4 10/02/2022   PLT 224 10/02/2022   Lab Results  Component Value Date   CHOL 154 04/09/2023   HDL 63 04/09/2023   LDLCALC 63 04/09/2023   TRIG 214 (H) 04/09/2023   CHOLHDL 2.4 04/09/2023   Lab Results  Component Value Date   HGBA1C 6.7 (H) 04/09/2023    Lab Results  Component Value Date   TSH 4.12 10/02/2022   Assessment & Plan:   Hyperlipidemia treated with 40 mg Rosuvastatin daily. 04/09/23 Lipid Panel TRIG 214, elevated but decreased from 221 on 10/02/22, otherwise WNL.   Diabetes Mellitus type II not currently treated with medication per patient request; previously treated with Januvia and Metformin. Her 04/09/23 HgbA1c 6.7, increased from 6.5 on 10/02/22. Had diabetic eye exam 11/12/2022 with Dr. Lindi Herrera at [facility].   04/09/23 Hepatic Function Panel reviewed: WNL  Brings attention to Pain in 2nd & 3rd Toe, Left Foot that feels like she is stepping on electricity. Has previously seen Dr. Gala Herrera, DPM with [facility] in [place] and she would like to go back to him for evaluation - last seen in 2019 for plantar fascitis.    Notes that she will soon need to replace her hearing aids. Also has a toothache, but has a dental appointment for the end of the month to evaluate this.   Return in 6 months for annual Medicare wellness visit. No change in meds.  I,Emily Lagle,acting as a Neurosurgeon for Margaree Mackintosh, MD.,have documented all relevant documentation on the behalf of Margaree Mackintosh, MD,as directed by  Margaree Mackintosh, MD while in the presence of  Margaree Mackintosh, MD.   I, Margaree Mackintosh, MD, have reviewed all documentation for this visit. The documentation on 04/10/23 for the exam, diagnosis, procedures, and orders are all accurate and complete.IMargaree Mackintosh, MD, have reviewed all documentation for this visit. The documentation on 04/10/23 for the exam, diagnosis, procedures, and orders are all accurate and complete.

## 2023-06-06 ENCOUNTER — Encounter (INDEPENDENT_AMBULATORY_CARE_PROVIDER_SITE_OTHER): Payer: Self-pay

## 2023-07-24 ENCOUNTER — Ambulatory Visit (HOSPITAL_BASED_OUTPATIENT_CLINIC_OR_DEPARTMENT_OTHER)
Admission: RE | Admit: 2023-07-24 | Discharge: 2023-07-24 | Disposition: A | Discharge status: Home / Self Care | Payer: Self-pay | Source: Ambulatory Visit | Attending: Acute Care | Admitting: Acute Care

## 2023-07-24 ENCOUNTER — Ambulatory Visit (HOSPITAL_BASED_OUTPATIENT_CLINIC_OR_DEPARTMENT_OTHER): Payer: Self-pay | Admitting: Nurse Practitioner

## 2023-07-24 DIAGNOSIS — Z87891 Personal history of nicotine dependence: Secondary | ICD-10-CM | POA: Insufficient documentation

## 2023-07-24 DIAGNOSIS — Z122 Encounter for screening for malignant neoplasm of respiratory organs: Secondary | ICD-10-CM | POA: Diagnosis present

## 2023-08-14 ENCOUNTER — Other Ambulatory Visit: Payer: Self-pay

## 2023-08-14 DIAGNOSIS — Z87891 Personal history of nicotine dependence: Secondary | ICD-10-CM

## 2023-08-14 DIAGNOSIS — Z122 Encounter for screening for malignant neoplasm of respiratory organs: Secondary | ICD-10-CM

## 2023-08-28 ENCOUNTER — Ambulatory Visit: Payer: Self-pay

## 2023-08-28 ENCOUNTER — Encounter: Payer: Self-pay | Admitting: Internal Medicine

## 2023-08-28 ENCOUNTER — Ambulatory Visit (INDEPENDENT_AMBULATORY_CARE_PROVIDER_SITE_OTHER): Admitting: Internal Medicine

## 2023-08-28 VITALS — BP 120/80 | HR 76 | Temp 98.2°F | Ht 64.0 in | Wt 161.0 lb

## 2023-08-28 DIAGNOSIS — Z87891 Personal history of nicotine dependence: Secondary | ICD-10-CM | POA: Diagnosis not present

## 2023-08-28 DIAGNOSIS — J22 Unspecified acute lower respiratory infection: Secondary | ICD-10-CM

## 2023-08-28 DIAGNOSIS — G4730 Sleep apnea, unspecified: Secondary | ICD-10-CM | POA: Diagnosis not present

## 2023-08-28 DIAGNOSIS — H903 Sensorineural hearing loss, bilateral: Secondary | ICD-10-CM | POA: Diagnosis not present

## 2023-08-28 MED ORDER — METHYLPREDNISOLONE 4 MG PO TBPK: ORAL_TABLET | ORAL | 0 refills | Status: DC

## 2023-08-28 MED ORDER — HYDROCODONE BIT-HOMATROP MBR 5-1.5 MG/5ML PO SOLN: 5.0000 mL | Freq: Three times a day (TID) | ORAL | 0 refills | Status: DC | PRN

## 2023-08-28 MED ORDER — LEVOFLOXACIN 500 MG PO TABS: 500.0000 mg | ORAL_TABLET | Freq: Every day | ORAL | 0 refills | Status: AC

## 2023-08-28 NOTE — Progress Notes (Signed)
 Patient Care Team: Perri Ronal PARAS, MD as PCP - General (Internal Medicine) Lonni Slain, MD as PCP - Cardiology (Cardiology) Camillo Golas, MD as Attending Physician (Ophthalmology)  Visit Date: 08/28/23  Subjective:   Chief Complaint  Patient presents with   Cough    Symptoms began several days ago. Interventions attempted: OTC medications: benadryl, dyquil sudafed. Symptoms are gradually worsening. Patient complains of SOB.    Nasal Congestion   Patient PI:Dawn Herrera,Dawn Herrera DOB:[birth year],73 y.o. FMW:[id number]   73 y.o.Dawn Herrera presents today for acute sick visit with Cough; Nasal Congestion x5 days. Patient has a past medical history of Sleep Apnea; Emphysema; Former Smoker. Says that she recently was on vacation to the [place] with her family, was both flying and driving. During that time her cough was more persistent and severe with expectoration of discolored mucus but these symptoms have improved, however she is still coughing and has been feeling short of breath. Has tried Benadryl, Dayquil, and Sudafed for symptom relief, which did not work, and symptoms have been gradually worsening.   History of Bilateral Hearing Loss managed with bilateral hearing aids.  Past Medical History:  Diagnosis Date   Adenomatous polyp    Allergic rhinitis    Depression    Hyperlipidemia    Prediabetes    Sleep apnea    wears mouth guard    Allergies  Allergen Reactions   Erythromycin Nausea Only    Family History  Problem Relation Age of Onset   Heart disease Father    Diabetes Other    Social History   Social History Narrative   Not on file   Review of Systems  HENT:  Positive for congestion.   Respiratory:  Positive for cough, sputum production and shortness of breath.      Objective:  Vitals: BP 120/80   Pulse 76   Temp 98.2 F (36.8 C)   Ht 5' 4 (1.626 m)   Wt 161 lb (73 kg)   LMP 03/14/1995 (Approximate)   SpO2 97%   BMI 27.64 kg/m   Physical  Exam Vitals and nursing note reviewed.  Constitutional:      General: She is not in acute distress.    Appearance: Normal appearance. She is not ill-appearing.  HENT:     Head: Normocephalic and atraumatic.     Right Ear: Tympanic membrane, ear canal and external ear normal. Right ear decreased hearing: bilateral hearing aids.     Left Ear: Tympanic membrane, ear canal and external ear normal. Left ear decreased hearing: bilateral hearing aids.     Mouth/Throat:     Mouth: Mucous membranes are moist.     Pharynx: Oropharynx is clear. No oropharyngeal exudate or posterior oropharyngeal erythema.  Pulmonary:     Effort: Pulmonary effort is normal.     Breath sounds: Examination of the left-upper field reveals wheezing. Wheezing (inpsiratory) present. No rhonchi or rales.  Lymphadenopathy:     Cervical: Cervical adenopathy (anterior, bilateral) present.   Skin:    General: Skin is warm and dry.   Neurological:     Mental Status: She is alert and oriented to person, place, and time. Mental status is at baseline.   Psychiatric:        Mood and Affect: Mood normal.        Behavior: Behavior normal.        Thought Content: Thought content normal.        Judgment: Judgment normal.     Results:  Studies  Obtained And Personally Reviewed By Me:  Narrative & Impression  CLINICAL DATA:  50 pack-year smoking history/quit 9 years ago   EXAM: CT CHEST WITHOUT CONTRAST LOW-DOSE FOR LUNG CANCER SCREENING   TECHNIQUE: Multidetector CT imaging of the chest was performed following the standard protocol without IV contrast.   RADIATION DOSE REDUCTION: This exam was performed according to the departmental dose-optimization program which includes automated exposure control, adjustment of the mA and/or kV according to patient size and/or use of iterative reconstruction technique.   COMPARISON:  07/21/2022   FINDINGS: Cardiovascular: Aortic atherosclerosis. Normal heart size,  without pericardial effusion. Three vessel coronary artery calcification.   Mediastinum/Nodes: No mediastinal or hilar adenopathy, given limitations of unenhanced CT.   Lungs/Pleura: No pleural fluid. Mild centrilobular emphysema. Calcified granulomas of 2.1 mm. A left lower lobe nodule of 1.5 mm.   Upper Abdomen: Central left hepatic lobe 1.4 cm cyst. Normal imaged portions of the spleen, stomach, pancreas, gallbladder, adrenal glands, right kidney. An incompletely imaged interpolar left renal 2.1 cm fluid density lesion is likely a cyst . In the absence of clinically indicated signs/symptoms require(s) no independent follow-up.   Musculoskeletal: Battery pack for deep brain or vagal stimulator in anterior right chest wall. No acute osseous abnormality.   IMPRESSION: 1. Lung-RADS 2, benign appearance or behavior. Continue annual screening with low-dose chest CT without contrast in 12 months. 2. Aortic Atherosclerosis (ICD10-I70.0) and Emphysema (ICD10-J43.9). Coronary artery atherosclerosis.   Narrative & Impression  CLINICAL DATA:  73 year old Dawn Herrera former smoker (quit 8 years ago) with 50 pack-year history of smoking. Lung cancer screening examination.   EXAM: CT CHEST WITHOUT CONTRAST LOW-DOSE FOR LUNG CANCER SCREENING   TECHNIQUE: Multidetector CT imaging of the chest was performed following the standard protocol without IV contrast.   RADIATION DOSE REDUCTION: This exam was performed according to the departmental dose-optimization program which includes automated exposure control, adjustment of the mA and/or kV according to patient size and/or use of iterative reconstruction technique.   COMPARISON:  Low-dose lung cancer screening chest CT 07/19/2021.   FINDINGS: Cardiovascular: Heart size is normal. There is no significant pericardial fluid, thickening or pericardial calcification. There is aortic atherosclerosis, as well as atherosclerosis of the great vessels  of the mediastinum and the coronary arteries, including calcified atherosclerotic plaque in the left anterior descending and left circumflex coronary arteries.   Mediastinum/Nodes: No pathologically enlarged mediastinal or hilar lymph nodes. Please note that accurate exclusion of hilar adenopathy is limited on noncontrast CT scans. Esophagus is unremarkable in appearance. No axillary lymphadenopathy.   Lungs/Pleura: Small pulmonary nodules are noted, largest of which is in the posterior aspect of the right lower lobe (axial image 112), with a volume derived mean diameter of only 3.8 mm. No larger more suspicious appearing pulmonary nodules or masses are noted. No acute consolidative airspace disease. No pleural effusions. Mild diffuse bronchial wall thickening with very mild centrilobular and paraseptal emphysema.   Upper Abdomen: Aortic atherosclerosis.   Musculoskeletal: Electronic device in the subcutaneous fat of the upper right anterior chest with lead extending into the cervical region (incompletely imaged), potentially a vagal nerve stimulator. There are no aggressive appearing lytic or blastic lesions noted in the visualized portions of the skeleton.   IMPRESSION: 1. Lung-RADS 2S, benign appearance or behavior. Continue annual screening with low-dose chest CT without contrast in 12 months. 2. The S modifier above refers to potentially clinically significant non lung cancer related findings. Specifically, there is aortic atherosclerosis, in addition to two-vessel  coronary artery disease. Please note that although the presence of coronary artery calcium  documents the presence of coronary artery disease, the severity of this disease and any potential stenosis cannot be assessed on this non-gated CT examination. Assessment for potential risk factor modification, dietary therapy or pharmacologic therapy may be warranted, if clinically indicated. 3. Mild diffuse bronchial  wall thickening with very mild centrilobular and paraseptal emphysema; imaging findings suggestive of underlying COPD.   Aortic Atherosclerosis (ICD10-I70.0) and Emphysema (ICD10-J43.9).   Narrative & Impression  CLINICAL DATA:  Former smoker, quit 7 years ago, 50 pack-year history.   EXAM: CT CHEST WITHOUT CONTRAST LOW-DOSE FOR LUNG CANCER SCREENING   TECHNIQUE: Multidetector CT imaging of the chest was performed following the standard protocol without IV contrast.   RADIATION DOSE REDUCTION: This exam was performed according to the departmental dose-optimization program which includes automated exposure control, adjustment of the mA and/or kV according to patient size and/or use of iterative reconstruction technique.   COMPARISON:  05/21/2020.   FINDINGS: Cardiovascular: Atherosclerotic calcification of the aorta and coronary arteries. Heart size normal. No pericardial effusion.   Mediastinum/Nodes: No pathologically enlarged mediastinal or axillary lymph nodes. Hilar regions are difficult to definitively evaluate without IV contrast. Esophagus is grossly unremarkable.   Lungs/Pleura: Biapical pleuroparenchymal scarring. Mild centrilobular emphysema. Minimal scattered pulmonary parenchymal scarring. Calcified granulomas. 4.3 mm right lower lobe nodule, stable. No new or worrisome pulmonary nodules. No pleural fluid. Airway is unremarkable.   Upper Abdomen: 13 mm low-attenuation lesion in the central liver, unchanged and likely a cyst. Visualized portions of the liver, gallbladder, adrenal glands and right kidney are unremarkable. Low-attenuation lesion in the upper pole left kidney measures 1.7 cm and is likely a cyst. No specific follow-up necessary. Visualized portions of the spleen, pancreas, stomach and bowel are grossly unremarkable.   Musculoskeletal: Degenerative changes in the spine. No worrisome lytic or sclerotic lesions.   IMPRESSION: 1. Lung-RADS 2,  benign appearance or behavior. Continue annual screening with low-dose chest CT without contrast in 12 months. 2. Aortic atherosclerosis (ICD10-I70.0). Coronary artery calcification. 3.  Emphysema (ICD10-J43.9).   Labs:     Component Value Date/Time   NA 142 10/02/2022 0911   K 4.2 10/02/2022 0911   CL 108 10/02/2022 0911   CO2 26 10/02/2022 0911   GLUCOSE 116 (H) 10/02/2022 0911   BUN 14 10/02/2022 0911   CREATININE 0.97 10/02/2022 0911   CALCIUM  10.1 10/02/2022 0911   PROT 6.8 04/09/2023 0920   ALBUMIN 4.2 09/26/2016 0917   AST 24 04/09/2023 0920   ALT 25 04/09/2023 0920   ALKPHOS 104 09/26/2016 0917   BILITOT 0.5 04/09/2023 0920   GFRNONAA 61 09/02/2019 0931   GFRAA 70 09/02/2019 0931    Lab Results  Component Value Date   WBC 4.8 10/02/2022   HGB 14.5 10/02/2022   HCT 44.6 10/02/2022   MCV 85.4 10/02/2022   PLT 224 10/02/2022   Lab Results  Component Value Date   CHOL 154 04/09/2023   HDL 63 04/09/2023   LDLCALC 63 04/09/2023   TRIG 214 (H) 04/09/2023   CHOLHDL 2.4 04/09/2023   Lab Results  Component Value Date   HGBA1C 6.7 (H) 04/09/2023    Lab Results  Component Value Date   TSH 4.12 10/02/2022    Assessment & Plan:   Meds ordered this encounter  Medications   HYDROcodone  bit-homatropine (HYCODAN) 5-1.5 MG/5ML syrup    Sig: Take 5 mLs by mouth every 8 (eight) hours as needed for  cough.    Dispense:  120 mL    Refill:  0   levofloxacin  (LEVAQUIN ) 500 MG tablet    Sig: Take 1 tablet (500 mg total) by mouth daily for 7 days.    Dispense:  7 tablet    Refill:  0   methylPREDNISolone  (MEDROL  DOSEPAK) 4 MG TBPK tablet    Sig: Take in tapering course as directed 6-5-4-3-2-1    Dispense:  21 tablet    Refill:  0   Acute Lower Respiratory Infection: recently was on vacation to the [place] with her family, was both flying and driving. During that time her cough was more persistent and severe with expectoration of discolored mucus but these symptoms have  improved, however she is still coughing and has been feeling short of breath. Has tried Benadryl, Dayquil, and Sudafed for symptom relief, which did not work. Has hx of Sleep Apnea; Emphysema; Formerly Smoking. Declines CXR unless symptoms worsen, does agree to treatment of 500 mg Levaquin  - take 1 tablet daily with a meal for 7 days, Hycodan syrup for cough - take 1 teaspoon every 8 hours as needed, and 4 mg Medrol  tapering course 6-5-4-3-2-1 due to reactive airways bronchospasm.   Bilateral Hearing Loss managed with bilateral hearing aids.   Hx sleep apnea and has Inspire device  Former smoker     Production manager as a Neurosurgeon for Ronal JINNY Hailstone, MD.,have documented all relevant documentation on the behalf of Ronal JINNY Hailstone, MD,as directed by  Ronal JINNY Hailstone, MD while in the presence of Ronal JINNY Hailstone, MD.   I, Ronal JINNY Hailstone, MD, have reviewed all documentation for this visit. The documentation on 09/09/23 for the exam, diagnosis, procedures, and orders are all accurate and complete.

## 2023-08-28 NOTE — Telephone Encounter (Signed)
 FYI Only or Action Required?: FYI only for provider  Patient was last seen in primary care on 04/10/2023 by Sylvan Evener, MD. Called Nurse Triage reporting Cough. Symptoms began several days ago. Interventions attempted: OTC medications: benadryl, dyquil sudafed, . Symptoms are: gradually worsening.  Triage Disposition: See HCP Within 4 Hours (Or PCP Triage)  Patient/caregiver understands and will follow disposition?: Yes        Copied from CRM [id number]. Topic: Clinical - Red Word Triage >> Aug 28, 2023  9:17 AM Stanly Early wrote: Red Word that prompted transfer to Nurse Triage: sob,congested,can't right ear. Reason for Disposition  [1] MILD difficulty breathing (e.g., minimal/no SOB at rest, SOB with walking, pulse <100) AND [2] still present when not coughing  Answer Assessment - Initial Assessment Questions 1. ONSET: When did the cough begin?      5 days ago 2. SEVERITY: How bad is the cough today?      mod 3. SPUTUM: Describe the color of your sputum (none, dry cough; clear, white, yellow, green)     none 4. HEMOPTYSIS: Are you coughing up any blood? If so ask: How much? (flecks, streaks, tablespoons, etc.)     none 5. DIFFICULTY BREATHING: Are you having difficulty breathing? If Yes, ask: How bad is it? (e.g., mild, moderate, severe)    - MILD: No SOB at rest, mild SOB with walking, speaks normally in sentences, can lie down, no retractions, pulse < 100.    - MODERATE: SOB at rest, SOB with minimal exertion and prefers to sit, cannot lie down flat, speaks in phrases, mild retractions, audible wheezing, pulse 100-120.    - SEVERE: Very SOB at rest, speaks in single words, struggling to breathe, sitting hunched forward, retractions, pulse > 120      mild 6. FEVER: Do you have a fever? If Yes, ask: What is your temperature, how was it measured, and when did it start?     Didn't take temp 7. CARDIAC HISTORY: Do you have any history of heart disease? (e.g., heart  attack, congestive heart failure)      no 8. LUNG HISTORY: Do you have any history of lung disease?  (e.g., pulmonary embolus, asthma, emphysema)     copd 9. PE RISK FACTORS: Do you have a history of blood clots? (or: recent major surgery, recent prolonged travel, bedridden)     no 10. OTHER SYMPTOMS: Do you have any other symptoms? (e.g., runny nose, wheezing, chest pain)       Congestion, right ear fullness 12. TRAVEL: Have you traveled out of the country in the last month? (e.g., travel history, exposures)    no  Protocols used: Cough - Acute Non-Productive-A-AH

## 2023-09-09 ENCOUNTER — Encounter: Payer: Self-pay | Admitting: Internal Medicine

## 2023-09-09 NOTE — Patient Instructions (Signed)
 Please take Levaquin  500 milligrams daily for 7 days and Hycodan syrup 1 teaspoon every 8 hours as needed for cough.  We are also placing you on a Medrol  4 mg Dosepak starting with 6 tabs day 1 and decreasing by 1 tablet daily i.e. 6-5-4-3-2-1 taper.  Call if not improved in 7 days or sooner if worse.

## 2023-09-10 ENCOUNTER — Ambulatory Visit: Admitting: Primary Care

## 2023-09-10 ENCOUNTER — Encounter: Payer: Self-pay | Admitting: Primary Care

## 2023-09-10 VITALS — BP 122/72 | HR 93 | Temp 97.3°F | Ht 63.0 in | Wt 158.2 lb

## 2023-09-10 DIAGNOSIS — G4733 Obstructive sleep apnea (adult) (pediatric): Secondary | ICD-10-CM

## 2023-09-10 DIAGNOSIS — Z87891 Personal history of nicotine dependence: Secondary | ICD-10-CM

## 2023-09-10 NOTE — Patient Instructions (Signed)
 -  OBSTRUCTIVE SLEEP APNEA: Obstructive sleep apnea is a condition where your airway becomes blocked during sleep, causing breathing interruptions. You are managing this with the Inspire device, which you use nightly at level three. We will order a home sleep study to assess your apnea events and ensure the Inspire device is used during the study. Based on the results, we will schedule a follow-up visit, which may be virtual if the results are satisfactory or in-person if there are residual events.  INSTRUCTIONS: Please complete the home sleep study as ordered and ensure you use the Inspire device during the study. Schedule a follow-up visit based on the results of the sleep study. Additionally, make an appointment with Dr. Carlie, the ENT specialist, to evaluate the movement and discomfort of your Inspire device.  Referral: Dr. Carlie  Orders Home sleep test  Follow-up August with Beth or Dr. Jude after sleep study

## 2023-09-10 NOTE — Progress Notes (Signed)
 @Patient  ID: Dawn Herrera, female    DOB: 06/05/1950, 73 y.o.   MRN: [id number]  No chief complaint on file.   Referring provider: Perri Ronal PARAS, MD  HPI: 73 year old female, former smoker quit in 2014.  Past medical history significant for OSA, coronary artery calcification, type 2 diabetes, osteopenia, hyperlipidemia, vitamin D  deficiency.   09/10/2023- Interim hx  Discussed the use of AI scribe software for clinical note transcription with the patient, who gave verbal consent to proceed.  History of Present Illness   Dawn Herrera is a 73 year old female with obstructive sleep apnea who presents for a six-month follow-up visit.  She has been using the Inspire device nightly at level three, corresponding to a 2.4 amplitude, and finds it beneficial, particularly in reducing snoring. Previously, she used CPAP for years but found it cumbersome. She monitors her snoring with an app and notes variability in snoring intensity, but it does not disrupt her sleep or wake her up.  She typically goes to bed around midnight, falls asleep quickly, and wakes up around 8:00 to 8:30 AM, getting approximately seven hours and forty-four minutes of sleep. She wakes once a night to use the bathroom and feels rested in the morning, though she takes a nap in a recliner around 2:00 PM daily.  She reports that the [facility] device moves, especially when she lies on her side, and itches. She has been concerned about the movement since the device was implanted in 2023 and has been using a sports bra to help stabilize it. She has not experienced any discomfort from the device's movement, but it is noticeable. She has not had a home sleep study since the device was implanted.  She is not waking up due to snoring or apnea events. She feels rested in the morning and does not experience discomfort from the device's movement.      Stimulation level : 2.2 V lower limit, upper limit 2.6 V.  Current  amplitude is 2.4 V (Level 3) 2.  Start delay is at 30 minutes, pause time 15 minutes, duration 8 hours.    Sleep sync compliance report 08/11/2023 - 09/09/2023 Total nights use 26/30 (87%); nights use greater than 4 hours 25/30 (83%) Average hours per night used 7 hours 44 minutes Average pauses per night 0.6 Current amplitude 2.4 (upper limit 2.5; lower limit 2.2 (  Allergies  Allergen Reactions   Erythromycin Nausea Only    Immunization History  Administered Date(s) Administered   Fluad Quad(high Dose 65+) 12/31/2019, 01/05/2023   Influenza,inj,Quad PF,6+ Mos 11/13/2014, 12/12/2018   Influenza-Unspecified 12/11/2013, 12/06/2020, 01/04/2022   PFIZER(Purple Top)SARS-COV-2 Vaccination 06/12/2019, 07/14/2019   Pneumococcal Conjugate-13 06/25/2018   Pneumococcal Polysaccharide-23 09/18/2019   Pneumococcal-Unspecified 09/18/2019   Tdap 06/25/2005, 08/09/2021   Zoster Recombinant(Shingrix) 07/01/2021, 12/27/2021   Zoster, Live 04/10/2011    Past Medical History:  Diagnosis Date   Adenomatous polyp    Allergic rhinitis    Depression    Hyperlipidemia    Prediabetes    Sleep apnea    wears mouth guard    Tobacco History: Social History   Tobacco Use  Smoking Status Former   Current packs/day: 0.00   Average packs/day: 1 pack/day for 50.0 years (50.0 ttl pk-yrs)   Types: Cigarettes   Start date: 01/13/1963   Quit date: 01/12/2013   Years since quitting: 10.6   Passive exposure: Never  Smokeless Tobacco Never   Counseling given: Not Answered   Outpatient Medications Prior  to Visit  Medication Sig Dispense Refill   aspirin  EC 81 MG tablet Take 1 tablet (81 mg total) by mouth daily. Swallow whole. 90 tablet 3   calcium  carbonate (OS-CAL) 600 MG TABS Take 600 mg by mouth daily.     cholecalciferol (VITAMIN D ) 1000 UNITS tablet Take 1,000 Units by mouth daily.     clobetasol ointment (TEMOVATE) 0.05 % Apply topically as needed.     HYDROcodone  bit-homatropine (HYCODAN)  5-1.5 MG/5ML syrup Take 5 mLs by mouth every 8 (eight) hours as needed for cough. 120 mL 0   Melatonin 1 MG CAPS Take 2 mg by mouth at bedtime.     methylPREDNISolone  (MEDROL  DOSEPAK) 4 MG TBPK tablet Take in tapering course as directed 6-5-4-3-2-1 21 tablet 0   mometasone (ELOCON) 0.1 % cream Apply topically as needed.     Multiple Vitamins-Minerals (MULTIVITAMIN WITH MINERALS) tablet Take 1 tablet by mouth daily.     rosuvastatin  (CRESTOR ) 40 MG tablet Take 1 tablet (40 mg total) by mouth daily. 90 tablet 3   No facility-administered medications prior to visit.      Review of Systems  Review of Systems  Constitutional: Negative.  Negative for fatigue.  HENT: Negative.    Respiratory: Negative.    Cardiovascular: Negative.   Psychiatric/Behavioral:  Negative for sleep disturbance.      Physical Exam  LMP 03/14/1995 (Approximate)  Physical Exam Constitutional:      General: She is not in acute distress.    Appearance: Normal appearance. She is not ill-appearing.  HENT:     Head: Normocephalic and atraumatic.     Mouth/Throat:     Mouth: Mucous membranes are moist.     Pharynx: Oropharynx is clear.   Cardiovascular:     Rate and Rhythm: Normal rate and regular rhythm.  Pulmonary:     Effort: Pulmonary effort is normal.     Breath sounds: Normal breath sounds.   Musculoskeletal:        General: Normal range of motion.   Skin:    General: Skin is warm and dry.   Neurological:     General: No focal deficit present.     Mental Status: She is alert and oriented to person, place, and time. Mental status is at baseline.   Psychiatric:        Mood and Affect: Mood normal.        Behavior: Behavior normal.        Thought Content: Thought content normal.        Judgment: Judgment normal.      Lab Results:  CBC    Component Value Date/Time   WBC 4.8 10/02/2022 0911   RBC 5.22 (H) 10/02/2022 0911   HGB 14.5 10/02/2022 0911   HCT 44.6 10/02/2022 0911   PLT 224  10/02/2022 0911   MCV 85.4 10/02/2022 0911   MCH 27.8 10/02/2022 0911   MCHC 32.5 10/02/2022 0911   RDW 14.0 10/02/2022 0911   LYMPHSABS 1,291 10/02/2022 0911   MONOABS 564 09/26/2016 0917   EOSABS 139 10/02/2022 0911   BASOSABS 19 10/02/2022 0911    BMET    Component Value Date/Time   NA 142 10/02/2022 0911   K 4.2 10/02/2022 0911   CL 108 10/02/2022 0911   CO2 26 10/02/2022 0911   GLUCOSE 116 (H) 10/02/2022 0911   BUN 14 10/02/2022 0911   CREATININE 0.97 10/02/2022 0911   CALCIUM  10.1 10/02/2022 0911   GFRNONAA 61 09/02/2019 0931  GFRAA 70 09/02/2019 0931    BNP No results found for: BNP  ProBNP No results found for: PROBNP  Imaging: No results found.   Assessment & Plan:   1. OSA (obstructive sleep apnea) (Primary) - Ambulatory referral to ENT - Home sleep test; Future  Assessment and Plan    Obstructive Sleep Apnea Obstructive sleep apnea managed with Inspire device. Consistent nightly use at level three (2.4 amplitude). Previously used CPAP but found it bothersome. Reports variable snoring, monitored with a snore app, but not disruptive to sleep. Sleeps approximately 7 hours and 44 minutes per night, waking once to use the bathroom. Feels rested upon waking but takes naps in the afternoon. No current issues with apnea noted, but due for a home sleep study to assess apnea events. - No changes to device. Wave form ran for 3 minutes  - Order home sleep study to assess apnea events - Ensure Inspire device is used during home sleep study - Schedule follow-up visit based on home sleep study results; virtual if results are satisfactory, in-person if residual events are noted  Stimulation level : 2.2 V lower limit, upper limit 2.6 V.  Currently amplitude is 2.4 V (Level 3) 2.  Start delay is at 30 minutes, pause time 15 minutes, duration 8 hours.    Device-related discomfort Reports discomfort and movement of the Inspire device, particularly when lying on her  side. Device implanted in November 2023. Persistent itching and movement noted. Movement is expected to some degree, but she reports excessive migration. She expresses reluctance for further surgery but is advised to consult with an ENT specialist for evaluation and potential intervention. Discussion with ENT may include surgical options such as securing the device with a mesh pouch if deemed necessary. - Refer to ENT specialist, Dr. Carlie, for evaluation of device movement and discomfort - Discuss potential surgical intervention to secure device if deemed necessary by ENT specialist     Dawn LELON Ferrari, NP 09/10/2023

## 2023-09-11 ENCOUNTER — Encounter: Payer: Self-pay | Admitting: Primary Care

## 2023-10-02 ENCOUNTER — Ambulatory Visit

## 2023-10-02 DIAGNOSIS — G4733 Obstructive sleep apnea (adult) (pediatric): Secondary | ICD-10-CM

## 2023-10-04 ENCOUNTER — Other Ambulatory Visit: Payer: Medicare Other

## 2023-10-04 DIAGNOSIS — E119 Type 2 diabetes mellitus without complications: Secondary | ICD-10-CM

## 2023-10-04 DIAGNOSIS — Z0000 Encounter for general adult medical examination without abnormal findings: Secondary | ICD-10-CM

## 2023-10-04 DIAGNOSIS — E1169 Type 2 diabetes mellitus with other specified complication: Secondary | ICD-10-CM

## 2023-10-04 DIAGNOSIS — R7302 Impaired glucose tolerance (oral): Secondary | ICD-10-CM

## 2023-10-04 DIAGNOSIS — G4733 Obstructive sleep apnea (adult) (pediatric): Secondary | ICD-10-CM

## 2023-10-05 ENCOUNTER — Telehealth: Payer: Self-pay

## 2023-10-05 ENCOUNTER — Encounter: Payer: Self-pay | Admitting: Internal Medicine

## 2023-10-05 ENCOUNTER — Ambulatory Visit: Payer: Medicare Other | Admitting: Internal Medicine

## 2023-10-05 VITALS — BP 120/70 | HR 76 | Ht 63.0 in | Wt 159.0 lb

## 2023-10-05 DIAGNOSIS — Z0000 Encounter for general adult medical examination without abnormal findings: Secondary | ICD-10-CM

## 2023-10-05 DIAGNOSIS — Z87891 Personal history of nicotine dependence: Secondary | ICD-10-CM

## 2023-10-05 DIAGNOSIS — G4730 Sleep apnea, unspecified: Secondary | ICD-10-CM

## 2023-10-05 DIAGNOSIS — E1169 Type 2 diabetes mellitus with other specified complication: Secondary | ICD-10-CM | POA: Diagnosis not present

## 2023-10-05 DIAGNOSIS — G4733 Obstructive sleep apnea (adult) (pediatric): Secondary | ICD-10-CM

## 2023-10-05 DIAGNOSIS — H903 Sensorineural hearing loss, bilateral: Secondary | ICD-10-CM | POA: Diagnosis not present

## 2023-10-05 DIAGNOSIS — Z9682 Presence of neurostimulator: Secondary | ICD-10-CM

## 2023-10-05 DIAGNOSIS — Z6828 Body mass index (BMI) 28.0-28.9, adult: Secondary | ICD-10-CM

## 2023-10-05 DIAGNOSIS — Z860101 Personal history of adenomatous and serrated colon polyps: Secondary | ICD-10-CM

## 2023-10-05 DIAGNOSIS — Z1211 Encounter for screening for malignant neoplasm of colon: Secondary | ICD-10-CM | POA: Diagnosis not present

## 2023-10-05 DIAGNOSIS — E782 Mixed hyperlipidemia: Secondary | ICD-10-CM

## 2023-10-05 LAB — POCT URINALYSIS DIP (CLINITEK)
Bilirubin, UA: NEGATIVE
Blood, UA: NEGATIVE
Glucose, UA: NEGATIVE mg/dL
Ketones, POC UA: NEGATIVE mg/dL
Leukocytes, UA: NEGATIVE
Nitrite, UA: NEGATIVE
POC PROTEIN,UA: NEGATIVE
Spec Grav, UA: 1.015 (ref 1.010–1.025)
Urobilinogen, UA: 0.2 U/dL
pH, UA: 6.5 (ref 5.0–8.0)

## 2023-10-05 LAB — LIPID PANEL
Cholesterol: 162 mg/dL (ref <200)
HDL: 59 mg/dL (ref >=50)
LDL Cholesterol (Calc): 76 mg/dL
Non-HDL Cholesterol (Calc): 103 mg/dL (ref <130)
Total CHOL/HDL Ratio: 2.7 (calc) (ref <5.0)
Triglycerides: 168 mg/dL — ABNORMAL HIGH (ref <150)

## 2023-10-05 LAB — CBC WITH DIFFERENTIAL/PLATELET
Absolute Lymphocytes: 996 {cells}/uL (ref 850–3900)
Absolute Monocytes: 503 {cells}/uL (ref 200–950)
Basophils Absolute: 9 {cells}/uL (ref 0–200)
Basophils Relative: 0.2 %
Eosinophils Absolute: 169 {cells}/uL (ref 15–500)
Eosinophils Relative: 3.6 %
HCT: 44.9 % (ref 35.0–45.0)
Hemoglobin: 14.5 g/dL (ref 11.7–15.5)
MCH: 28.6 pg (ref 27.0–33.0)
MCHC: 32.3 g/dL (ref 32.0–36.0)
MCV: 88.6 fL (ref 80.0–100.0)
MPV: 11.1 fL (ref 7.5–12.5)
Monocytes Relative: 10.7 %
Neutro Abs: 3022 {cells}/uL (ref 1500–7800)
Neutrophils Relative %: 64.3 %
Platelets: 201 Thousand/uL (ref 140–400)
RBC: 5.07 Million/uL (ref 3.80–5.10)
RDW: 13.6 % (ref 11.0–15.0)
Total Lymphocyte: 21.2 %
WBC: 4.7 Thousand/uL (ref 3.8–10.8)

## 2023-10-05 LAB — COMPLETE METABOLIC PANEL WITHOUT GFR
AG Ratio: 1.8 (calc) (ref 1.0–2.5)
ALT: 23 U/L (ref 6–29)
AST: 27 U/L (ref 10–35)
Albumin: 4.2 g/dL (ref 3.6–5.1)
Alkaline phosphatase (APISO): 99 U/L (ref 37–153)
BUN: 17 mg/dL (ref 7–25)
CO2: 27 mmol/L (ref 20–32)
Calcium: 9.9 mg/dL (ref 8.6–10.4)
Chloride: 106 mmol/L (ref 98–110)
Creat: 0.99 mg/dL (ref 0.60–1.00)
Globulin: 2.4 g/dL (ref 1.9–3.7)
Glucose, Bld: 119 mg/dL — ABNORMAL HIGH (ref 65–99)
Potassium: 4.7 mmol/L (ref 3.5–5.3)
Sodium: 141 mmol/L (ref 135–146)
Total Bilirubin: 0.7 mg/dL (ref 0.2–1.2)
Total Protein: 6.6 g/dL (ref 6.1–8.1)

## 2023-10-05 LAB — HEMOGLOBIN A1C
Hgb A1c MFr Bld: 6.8 % — ABNORMAL HIGH (ref <5.7)
Mean Plasma Glucose: 148 mg/dL
eAG (mmol/L): 8.2 mmol/L

## 2023-10-05 LAB — TSH: TSH: 2.62 m[IU]/L (ref 0.40–4.50)

## 2023-10-05 LAB — MICROALBUMIN / CREATININE URINE RATIO
Creatinine, Urine: 73 mg/dL (ref 20–275)
Microalb Creat Ratio: 19 mg/g{creat} (ref <30)
Microalb, Ur: 1.4 mg/dL

## 2023-10-05 NOTE — Progress Notes (Incomplete)
 Annual Wellness Visit   Patient Care Team: , Ronal PARAS, MD as PCP - General (Internal Medicine) Lonni Slain, MD as PCP - Cardiology (Cardiology) Camillo Golas, MD as Attending Physician (Ophthalmology)  Visit Date: 10/05/23   Chief Complaint  Patient presents with   Medicare Wellness   Annual Exam   Subjective:  Patient: Dawn Herrera, Female DOB: 1951/02/20, 73 y.o. MRN: [id number] Vitals:   10/05/23 1443  BP: 120/70   Dawn Herrera is a 73 y.o. Female who presents today for her Annual Wellness Visit. Patient has Vitamin D  deficiency; Hx of adenomatous colonic polyps; Hyperlipidemia; Smoking history; OSA (obstructive sleep apnea); Benign microscopic hematuria; Osteopenia; History of COPD; Mixed hyperlipidemia; Nonocclusive coronary atherosclerosis of native coronary artery; Coronary artery calcification seen on CT scan; and Type 2 diabetes mellitus without complication, without long-term current use of insulin (HCC) on their problem list.  History of Hyperlipidemia treated with Rosuvastatin  40 mg daily. 10/04/2023 Lipid Panel: Triglycerides 168; otherwise WNL.    History of Diabetes Mellitus, type II with 10/04/2023 HgbA1c 6.8, Glucose 119.  Monica Doerr  Lab Results  Component Value Date   GLUCOSE 119 (H) 10/04/2023   HGBA1C 6.8 (H) 10/04/2023    History of Vitamin-D Deficiency treated with Vitamin-D 1000 units daily. Vitamin-D 10/02/2022: 80.  History of *** treated with  History of *** treated with  History of *** treated with  History of *** treated with  History of *** treated with  Current Outpatient Medications  Medication Instructions   aspirin  EC 81 mg, Oral, Daily, Swallow whole.   calcium  carbonate (OS-CAL) 600 mg, Daily   cholecalciferol (VITAMIN D ) 1,000 Units, Daily   clobetasol ointment (TEMOVATE) 0.05 % As needed   Melatonin 2 mg, Nightly   mometasone (ELOCON) 0.1 % cream As needed   Multiple Vitamins-Minerals (MULTIVITAMIN  WITH MINERALS) tablet 1 tablet, Daily   rosuvastatin  (CRESTOR ) 40 mg, Oral, Daily    Labs 10/04/2023 CBC: WNL C-MET: Glucose 119; otherwise WNL TSH: 2.62  PAP Smear ***/***/***  *** with repeat recommendation of ***.  Mammogram ***/***/***  *** with repeat recommendation of ***.  Colonoscopy ***/***/***  *** with repeat recommendation of ***.  Bone Density ***/***/***  *** with repeat recommendation of ***.   Vaccine Counseling: Due for {Vaccines:32291::Influenza}; UTD on {Vaccines:32291::Influenza} There are no preventive care reminders to display for this patient.  Outpatient Medications Prior to Visit  Medication Sig   aspirin  EC 81 MG tablet Take 1 tablet (81 mg total) by mouth daily. Swallow whole.   calcium  carbonate (OS-CAL) 600 MG TABS Take 600 mg by mouth daily.   cholecalciferol (VITAMIN D ) 1000 UNITS tablet Take 1,000 Units by mouth daily.   clobetasol ointment (TEMOVATE) 0.05 % Apply topically as needed.   Melatonin 1 MG CAPS Take 2 mg by mouth at bedtime.   mometasone (ELOCON) 0.1 % cream Apply topically as needed.   Multiple Vitamins-Minerals (MULTIVITAMIN WITH MINERALS) tablet Take 1 tablet by mouth daily.   rosuvastatin  (CRESTOR ) 40 MG tablet Take 1 tablet (40 mg total) by mouth daily.   [DISCONTINUED] HYDROcodone  bit-homatropine (HYCODAN) 5-1.5 MG/5ML syrup Take 5 mLs by mouth every 8 (eight) hours as needed for cough.   [DISCONTINUED] methylPREDNISolone  (MEDROL  DOSEPAK) 4 MG TBPK tablet Take in tapering course as directed 6-5-4-3-2-1   No facility-administered medications prior to visit.   Past Medical History:  Diagnosis Date   Adenomatous polyp    Allergic rhinitis    Depression    Hyperlipidemia  Prediabetes    Sleep apnea    wears mouth guard   Medical/Surgical History Narrative:  Allergic/Intolerant to:  Allergies  Allergen Reactions   Erythromycin Nausea Only   *** - ***  *** - ***  *** - ***  *** - ***  *** - ***  *** -  ***  *** - ***  *** - *** Past Surgical History:  Procedure Laterality Date   COLONOSCOPY  03/21/2010   TUBAL LIGATION  2004   Other - Hx of: *** ; Surghx of: *** Family History  Problem Relation Age of Onset   Heart disease Father    Diabetes Other    Family History Narrative: family history includes Diabetes in an other family member; Heart disease in her father.  Oldest sister (susie) fell 2 compression fx, passed last thursdya      ROS  Objective:  Vitals: BP 120/70   Pulse 76   Ht 5' 3 (1.6 m)   Wt 159 lb (72.1 kg)   LMP 03/14/1995 (Approximate)   SpO2 95%   BMI 28.17 kg/m  Physical Exam Most Recent Functional Status Assessment:    10/05/2023    2:45 PM  In your present state of health, do you have any difficulty performing the following activities:  Hearing? 0  Vision? 0  Difficulty concentrating or making decisions? 0  Walking or climbing stairs? 0  Dressing or bathing? 0  Doing errands, shopping? 0  Preparing Food and eating ? N  Using the Toilet? N  In the past six months, have you accidently leaked urine? Y  Do you have problems with loss of bowel control? Y  Managing your Medications? N  Managing your Finances? N  Housekeeping or managing your Housekeeping? N   Most Recent Fall Risk Assessment:    10/05/2023    2:46 PM  Fall Risk   Falls in the past year? 0  Number falls in past yr: 0  Injury with Fall? 0  Risk for fall due to : No Fall Risks  Follow up Falls prevention discussed;Education provided;Falls evaluation completed   Most Recent Depression Screenings:    10/03/2022   11:18 AM 05/23/2022   12:01 PM  PHQ 2/9 Scores  PHQ - 2 Score 0 0   Most Recent Cognitive Screening:    10/05/2023    2:50 PM  6CIT Screen  What Year? 0 points  What month? 0 points  What time? 0 points  Count back from 20 0 points  Months in reverse 0 points  Repeat phrase 0 points  Total Score 0 points   Results:  Studies Obtained And Personally  Reviewed By Me:  {Imaging, colonoscopy, mammogram, bone density scan, echocardiogram, heart cath, stress test, CT calcium  score, etc.:32292}  Labs:  CBC w/ Differential Lab Results  Component Value Date   WBC 4.7 10/04/2023   RBC 5.07 10/04/2023   HGB 14.5 10/04/2023   HCT 44.9 10/04/2023   PLT 201 10/04/2023   MCV 88.6 10/04/2023   MCH 28.6 10/04/2023   MCHC 32.3 10/04/2023   RDW 13.6 10/04/2023   MPV 11.1 10/04/2023   LYMPHSABS 1,291 10/02/2022   MONOABS 564 09/26/2016   BASOSABS 9 10/04/2023    Comprehensive Metabolic Panel Lab Results  Component Value Date   NA 141 10/04/2023   K 4.7 10/04/2023   CL 106 10/04/2023   CO2 27 10/04/2023   GLUCOSE 119 (H) 10/04/2023   BUN 17 10/04/2023   CREATININE 0.99 10/04/2023   CALCIUM   9.9 10/04/2023   PROT 6.6 10/04/2023   ALBUMIN 4.2 09/26/2016   AST 27 10/04/2023   ALT 23 10/04/2023   ALKPHOS 104 09/26/2016   BILITOT 0.7 10/04/2023   EGFR 62 10/02/2022   GFRNONAA 61 09/02/2019   Lipid Panel  Lab Results  Component Value Date   CHOL 162 10/04/2023   HDL 59 10/04/2023   LDLCALC 76 10/04/2023   TRIG 168 (H) 10/04/2023   A1c Lab Results  Component Value Date   HGBA1C 6.8 (H) 10/04/2023    TSH Lab Results  Component Value Date   TSH 2.62 10/04/2023   PSA{PSA (Optional):32132} Assessment & Plan:   Orders Placed This Encounter  Procedures   Amb Referral to Colonoscopy    Referral Priority:   Routine    Referral Type:   Consultation    Referred to Provider:   Dianna Specking, MD    Requested Specialty:   Gastroenterology    Number of Visits Requested:   1   No orders of the defined types were placed in this encounter.  Other Labs Reviewed today:    No follow-ups on file.   Annual wellness visit done today including the all of the following: Reviewed patient's Family Medical History Reviewed and updated list of patient's medical providers Assessment of cognitive impairment was done Assessed  patient's functional ability Established a written schedule for health screening services Health Risk Assessent Completed and Reviewed  Discussed health benefits of physical activity, and encouraged her to engage in regular exercise appropriate for her age and condition.    I,Emily Lagle,acting as a Neurosurgeon for Ronal JINNY Hailstone, MD.,have documented all relevant documentation on the behalf of Ronal JINNY Hailstone, MD,as directed by  Ronal JINNY Hailstone, MD while in the presence of Ronal JINNY Hailstone, MD.   ***

## 2023-10-05 NOTE — Progress Notes (Unsigned)
 Subjective:   Dawn Herrera is a 73 y.o. female who presents for Medicare Annual (Subsequent) preventive examination.  Visit Complete: In person  Patient Medicare AWV questionnaire was completed by the patient on 10/05/23; I have confirmed that all information answered by patient is correct and no changes since this date.  Cardiac Risk Factors include: advanced age (>58men, >63 women);dyslipidemia     Objective:    Today's Vitals   10/05/23 1443  BP: 120/70  Pulse: 76  SpO2: 95%  Weight: 159 lb (72.1 kg)  Height: 5' 3 (1.6 m)  PainSc: 0-No pain   Body mass index is 28.17 kg/m.     10/05/2023    2:50 PM 07/06/2022    8:10 PM 09/26/2021    2:52 PM  Advanced Directives  Does Patient Have a Medical Advance Directive? Yes Yes Yes  Type of Advance Directive Living will;Healthcare Power of [employer] Power of [place];Living will Healthcare Power of [place];Living will  Does patient want to make changes to medical advance directive?  No - Patient declined No - Patient declined  Copy of Healthcare Power of Attorney in Chart? No - copy requested No - copy requested No - copy requested    Current Medications (verified) Outpatient Encounter Medications as of 10/05/2023  Medication Sig   aspirin  EC 81 MG tablet Take 1 tablet (81 mg total) by mouth daily. Swallow whole.   calcium  carbonate (OS-CAL) 600 MG TABS Take 600 mg by mouth daily.   cholecalciferol (VITAMIN D ) 1000 UNITS tablet Take 1,000 Units by mouth daily.   clobetasol ointment (TEMOVATE) 0.05 % Apply topically as needed.   Melatonin 1 MG CAPS Take 2 mg by mouth at bedtime.   mometasone (ELOCON) 0.1 % cream Apply topically as needed.   Multiple Vitamins-Minerals (MULTIVITAMIN WITH MINERALS) tablet Take 1 tablet by mouth daily.   rosuvastatin  (CRESTOR ) 40 MG tablet Take 1 tablet (40 mg total) by mouth daily.   [DISCONTINUED] HYDROcodone  bit-homatropine (HYCODAN) 5-1.5 MG/5ML syrup Take 5 mLs by mouth every  8 (eight) hours as needed for cough.   [DISCONTINUED] methylPREDNISolone  (MEDROL  DOSEPAK) 4 MG TBPK tablet Take in tapering course as directed 6-5-4-3-2-1   No facility-administered encounter medications on file as of 10/05/2023.    Allergies (verified) Erythromycin   History: Past Medical History:  Diagnosis Date   Adenomatous polyp    Allergic rhinitis    Depression    Hyperlipidemia    Prediabetes    Sleep apnea    wears mouth guard   Past Surgical History:  Procedure Laterality Date   COLONOSCOPY  03/21/2010   TUBAL LIGATION  2004   Family History  Problem Relation Age of Onset   Heart disease Father    Diabetes Other    Social History   Socioeconomic History   Marital status: Widowed    Spouse name: Christopher   Number of children: Y   Years of education: Not on file   Highest education level: Some college, no degree  Occupational History   Occupation: Gaffer: [employer]    Comment: [employer]  Tobacco Use   Smoking status: Former    Current packs/day: 0.00    Average packs/day: 1 pack/day for 50.0 years (50.0 ttl pk-yrs)    Types: Cigarettes    Start date: 01/13/1963    Quit date: 01/12/2013    Years since quitting: 10.7    Passive exposure: Never   Smokeless tobacco: Never  Vaping Use   Vaping status: Never Used  Substance and Sexual Activity   Alcohol use: Yes    Alcohol/week: 1.0 standard drink of alcohol    Types: 1 Standard drinks or equivalent per week   Drug use: No   Sexual activity: Not Currently  Other Topics Concern   Not on file  Social History Narrative   Not on file   Social Drivers of Health   Financial Resource Strain: Low Risk  (10/03/2023)   Overall Financial Resource Strain (CARDIA)    Difficulty of Paying Living Expenses: Not hard at all  Food Insecurity: No Food Insecurity (10/03/2023)   Hunger Vital Sign    Worried About Running Out of Food in the Last Year: Never true    Ran  Out of Food in the Last Year: Never true  Transportation Needs: No Transportation Needs (10/03/2023)   PRAPARE - Administrator, Civil Service (Medical): No    Lack of Transportation (Non-Medical): No  Physical Activity: Sufficiently Active (10/03/2023)   Exercise Vital Sign    Days of Exercise per Week: 4 days    Minutes of Exercise per Session: 120 min  Stress: No Stress Concern Present (10/03/2023)   [employer] of Occupational Health - Occupational Stress Questionnaire    Feeling of Stress: Not at all  Social Connections: Moderately Integrated (10/03/2023)   Social Connection and Isolation Panel    Frequency of Communication with Friends and Family: More than three times a week    Frequency of Social Gatherings with Friends and Family: More than three times a week    Attends Religious Services: More than 4 times per year    Active Member of [employer] or Organizations: Yes    Attends Banker Meetings: More than 4 times per year    Marital Status: Widowed    Tobacco Counseling Counseling given: Not Answered   Clinical Intake:  Pre-visit preparation completed: Yes  Pain : No/denies pain Pain Score: 0-No pain     BMI - recorded: 28.17 Nutritional Status: BMI 25 -29 Overweight Diabetes: No  How often do you need to have someone help you when you read instructions, pamphlets, or other written materials from your doctor or pharmacy?: 1 - Never  Interpreter Needed?: No  Information entered by :: Kathlynn Porto, CMA   Activities of Daily Living    10/05/2023    2:45 PM 10/03/2023    8:00 PM  In your present state of health, do you have any difficulty performing the following activities:  Hearing? 0 0  Vision? 0 0  Difficulty concentrating or making decisions? 0 0  Walking or climbing stairs? 0 0  Dressing or bathing? 0 0  Doing errands, shopping? 0 0  Preparing Food and eating ? N N  Using the Toilet? N N  In the past six months, have you  accidently leaked urine? Y Y  Do you have problems with loss of bowel control? Y Y  Managing your Medications? N N  Managing your Finances? N N  Housekeeping or managing your Housekeeping? N N    Patient Care Team: Perri Ronal PARAS, MD as PCP - General (Internal Medicine) Lonni Slain, MD as PCP - Cardiology (Cardiology) Camillo Golas, MD as Attending Physician (Ophthalmology)  Indicate any recent Medical Services you may have received from other than [facility] providers in the past year (date may be approximate).     Assessment:   This is a routine wellness examination for [place].  Goals Addressed   None    Depression Screen    10/03/2022   11:18 AM 05/23/2022   12:01 PM 03/28/2022   10:21 AM 11/15/2021   11:13 AM 09/26/2021    2:52 PM 09/23/2020   10:06 AM 09/18/2019   10:12 AM  PHQ 2/9 Scores  PHQ - 2 Score 0 0 0 0 0 1 0    Fall Risk    10/05/2023    2:46 PM 10/03/2023    8:00 PM 10/03/2022   11:17 AM 05/23/2022   12:01 PM 03/28/2022   10:21 AM  Fall Risk   Falls in the past year? 0 0 0 0 0  Number falls in past yr: 0   0 0  Injury with Fall? 0 0  0   Risk for fall due to : No Fall Risks   No Fall Risks No Fall Risks  Follow up Falls prevention discussed;Education provided;Falls evaluation completed  Falls evaluation completed Falls prevention discussed Falls prevention discussed      Data saved with a previous flowsheet row definition    MEDICARE RISK AT HOME: Medicare Risk at Home Any stairs in or around the home?: No If so, are there any without handrails?: No Home free of loose throw rugs in walkways, pet beds, electrical cords, etc?: No Adequate lighting in your home to reduce risk of falls?: Yes Life alert?: No Use of a cane, walker or w/c?: No Grab bars in the bathroom?: Yes Shower chair or bench in shower?: No Elevated toilet seat or a handicapped toilet?: Yes  TIMED UP AND GO:  Was the test performed?  No    Cognitive Function:         10/05/2023    2:50 PM 09/26/2021    2:53 PM  6CIT Screen  What Year? 0 points 0 points  What month? 0 points 0 points  What time? 0 points 0 points  Count back from 20 0 points 0 points  Months in reverse 0 points 0 points  Repeat phrase 0 points 0 points  Total Score 0 points 0 points    Immunizations Immunization History  Administered Date(s) Administered   Fluad Quad(high Dose 65+) 12/31/2019, 01/05/2023   Influenza,inj,Quad PF,6+ Mos 11/13/2014, 12/12/2018   Influenza-Unspecified 12/11/2013, 12/06/2020, 01/04/2022   PFIZER(Purple Top)SARS-COV-2 Vaccination 06/12/2019, 07/14/2019   Pneumococcal Conjugate-13 06/25/2018   Pneumococcal Polysaccharide-23 09/18/2019   Pneumococcal-Unspecified 09/18/2019   Tdap 06/25/2005, 08/09/2021   Zoster Recombinant(Shingrix) 07/01/2021, 12/27/2021   Zoster, Live 04/10/2011    TDAP status: Up to date  Flu Vaccine status: Up to date  Pneumococcal vaccine status: Up to date  Covid-19 vaccine status: Information provided on how to obtain vaccines.   Qualifies for Shingles Vaccine? Yes   Zostavax completed Yes   Shingrix Completed?: Yes  Screening Tests Health Maintenance  Topic Date Due   INFLUENZA VACCINE  10/12/2023   OPHTHALMOLOGY EXAM  11/23/2023   HEMOGLOBIN A1C  04/05/2024   Lung Cancer Screening  07/23/2024   Diabetic kidney evaluation - eGFR measurement  10/03/2024   Diabetic kidney evaluation - Urine ACR  10/03/2024   FOOT EXAM  10/04/2024   Medicare Annual Wellness (AWV)  10/04/2024   MAMMOGRAM  10/15/2024   Colonoscopy  11/24/2027   DTaP/Tdap/Td (3 - Td or Tdap) 08/10/2031   Pneumococcal Vaccine: 50+ Years  Completed   DEXA SCAN  Completed   Zoster Vaccines- Shingrix  Completed   Hepatitis B Vaccines  Aged Out   HPV VACCINES  Aged Out   Meningococcal B Vaccine  Aged Out   COVID-19 Vaccine  Discontinued   Hepatitis C Screening  Discontinued    Health Maintenance  There are no preventive care reminders to  display for this patient.  Colorectal cancer screening: Type of screening: Colonoscopy. Completed 11/23/2017. Repeat every 10 years  Mammogram status: Completed 10/16/22. Repeat every year  Bone Density status: Completed 10/16/2022. Results reflect: Bone density results: OSTEOPOROSIS. Repeat every 2 years.  Lung Cancer Screening: (Low Dose CT Chest recommended if Age 98-80 years, 20 pack-year currently smoking OR have quit w/in 15years.) does qualify.   Lung Cancer Screening Referral: Yes, placed on 08/14/2023  Additional Screening:  Hepatitis C Screening: does not qualify; Completed   Vision Screening: Recommended annual ophthalmology exams for early detection of glaucoma and other disorders of the eye. Is the patient up to date with their annual eye exam?  Yes  Who is the provider or what is the name of the office in which the patient attends annual eye exams? [facility]  If pt is not established with a provider, would they like to be referred to a provider to establish care? No .   Dental Screening: Recommended annual dental exams for proper oral hygiene  Diabetic Foot Exam: Diabetic Foot Exam: Completed 10/05/23  Community Resource Referral / Chronic Care Management: CRR required this visit?  No   CCM required this visit?  No     Plan:     I have personally reviewed and noted the following in the patient's chart:   Medical and social history Use of alcohol, tobacco or illicit drugs  Current medications and supplements including opioid prescriptions. Patient is not currently taking opioid prescriptions. Functional ability and status Nutritional status Physical activity Advanced directives List of other physicians Hospitalizations, surgeries, and ER visits in previous 12 months Vitals Screenings to include cognitive, depression, and falls Referrals and appointments  In addition, I have reviewed and discussed with patient certain preventive protocols, quality metrics,  and best practice recommendations. A written personalized care plan for preventive services as well as general preventive health recommendations were provided to patient.     Araceli Zelda, CMA   10/05/2023   After Visit Summary: (In Person-Printed) AVS printed and given to the patient  I, Ronal JINNY Hailstone, MD, have reviewed all documentation for this visit. The documentation on 10/07/23 for the exam, diagnosis, procedures, and orders are all accurate and complete.

## 2023-10-05 NOTE — Telephone Encounter (Signed)
 Copied from CRM [id number]. Topic: Clinical - Medical Advice >> Oct 04, 2023 11:39 AM Dawn Herrera wrote: Reason for CRM: Patient is requesting to speak to provider's CMA regarding sleep study. She would like to know if it worked or if she needs to do it again. >> Oct 05, 2023  9:19 AM Dawn Herrera wrote: Pt is calling again to speak with clinic regarding her in home sleep study. She reports the equipment disconnected during the study and she is concerned it has affected the results. Requests call back  CB#  [phone]       Spoke with patient and let her know that I diud not see any result or notes since it was just done on the 7/22    Someone will call her when it has been reviewed    NFN-

## 2023-10-05 NOTE — Patient Instructions (Addendum)
 Please continue current meds and return in 6 months.  Next appointment: Follow up in one year for your annual wellness visit    Preventive Care 65 Years and Older, Female Preventive care refers to lifestyle choices and visits with your health care provider that can promote health and wellness. What does preventive care include? A yearly physical exam. This is also called an annual well check. Dental exams once or twice a year. Routine eye exams. Ask your health care provider how often you should have your eyes checked. Personal lifestyle choices, including: Daily care of your teeth and gums. Regular physical activity. Eating a healthy diet. Avoiding tobacco and drug use. Limiting alcohol use. Practicing safe sex. Taking low-dose aspirin  every day. Taking vitamin and mineral supplements as recommended by your health care provider. What happens during an annual well check? The services and screenings done by your health care provider during your annual well check will depend on your age, overall health, lifestyle risk factors, and family history of disease. Counseling  Your health care provider may ask you questions about your: Alcohol use. Tobacco use. Drug use. Emotional well-being. Home and relationship well-being. Sexual activity. Eating habits. History of falls. Memory and ability to understand (cognition). Work and work Astronomer. Reproductive health. Screening  You may have the following tests or measurements: Height, weight, and BMI. Blood pressure. Lipid and cholesterol levels. These may be checked every 5 years, or more frequently if you are over 19 years old. Skin check. Lung cancer screening. You may have this screening every year starting at age 74 if you have a 30-pack-year history of smoking and currently smoke or have quit within the past 15 years. Fecal occult blood test (FOBT) of the stool. You may have this test every year starting at age 13. Flexible  sigmoidoscopy or colonoscopy. You may have a sigmoidoscopy every 5 years or a colonoscopy every 10 years starting at age 16. Hepatitis C blood test. Hepatitis B blood test. Sexually transmitted disease (STD) testing. Diabetes screening. This is done by checking your blood sugar (glucose) after you have not eaten for a while (fasting). You may have this done every 1-3 years. Bone density scan. This is done to screen for osteoporosis. You may have this done starting at age 44. Mammogram. This may be done every 1-2 years. Talk to your health care provider about how often you should have regular mammograms. Talk with your health care provider about your test results, treatment options, and if necessary, the need for more tests. Vaccines  Your health care provider may recommend certain vaccines, such as: Influenza vaccine. This is recommended every year. Tetanus, diphtheria, and acellular pertussis (Tdap, Td) vaccine. You may need a Td booster every 10 years. Zoster vaccine. You may need this after age 108. Pneumococcal 13-valent conjugate (PCV13) vaccine. One dose is recommended after age 13. Pneumococcal polysaccharide (PPSV23) vaccine. One dose is recommended after age 43. Talk to your health care provider about which screenings and vaccines you need and how often you need them. This information is not intended to replace advice given to you by your health care provider. Make sure you discuss any questions you have with your health care provider. Document Released: 03/26/2015 Document Revised: 11/17/2015 Document Reviewed: 12/29/2014 Elsevier Interactive Patient Education  2017 [employer].  Fall Prevention in the Home Falls can cause injuries. They can happen to people of all ages. There are many things you can do to make your home safe and to help prevent  falls. What can I do on the outside of my home? Regularly fix the edges of walkways and driveways and fix any cracks. Remove anything that  might make you trip as you walk through a door, such as a raised step or threshold. Trim any bushes or trees on the path to your home. Use bright outdoor lighting. Clear any walking paths of anything that might make someone trip, such as rocks or tools. Regularly check to see if handrails are loose or broken. Make sure that both sides of any steps have handrails. Any raised decks and porches should have guardrails on the edges. Have any leaves, snow, or ice cleared regularly. Use sand or salt on walking paths during winter. Clean up any spills in your garage right away. This includes oil or grease spills. What can I do in the bathroom? Use night lights. Install grab bars by the toilet and in the tub and shower. Do not use towel bars as grab bars. Use non-skid mats or decals in the tub or shower. If you need to sit down in the shower, use a plastic, non-slip stool. Keep the floor dry. Clean up any water that spills on the floor as soon as it happens. Remove soap buildup in the tub or shower regularly. Attach bath mats securely with double-sided non-slip rug tape. Do not have throw rugs and other things on the floor that can make you trip. What can I do in the bedroom? Use night lights. Make sure that you have a light by your bed that is easy to reach. Do not use any sheets or blankets that are too big for your bed. They should not hang down onto the floor. Have a firm chair that has side arms. You can use this for support while you get dressed. Do not have throw rugs and other things on the floor that can make you trip. What can I do in the kitchen? Clean up any spills right away. Avoid walking on wet floors. Keep items that you use a lot in easy-to-reach places. If you need to reach something above you, use a strong step stool that has a grab bar. Keep electrical cords out of the way. Do not use floor polish or wax that makes floors slippery. If you must use wax, use non-skid floor  wax. Do not have throw rugs and other things on the floor that can make you trip. What can I do with my stairs? Do not leave any items on the stairs. Make sure that there are handrails on both sides of the stairs and use them. Fix handrails that are broken or loose. Make sure that handrails are as long as the stairways. Check any carpeting to make sure that it is firmly attached to the stairs. Fix any carpet that is loose or worn. Avoid having throw rugs at the top or bottom of the stairs. If you do have throw rugs, attach them to the floor with carpet tape. Make sure that you have a light switch at the top of the stairs and the bottom of the stairs. If you do not have them, ask someone to add them for you. What else can I do to help prevent falls? Wear shoes that: Do not have high heels. Have rubber bottoms. Are comfortable and fit you well. Are closed at the toe. Do not wear sandals. If you use a stepladder: Make sure that it is fully opened. Do not climb a closed stepladder. Make sure that both  sides of the stepladder are locked into place. Ask someone to hold it for you, if possible. Clearly mark and make sure that you can see: Any grab bars or handrails. First and last steps. Where the edge of each step is. Use tools that help you move around (mobility aids) if they are needed. These include: Canes. Walkers. Scooters. Crutches. Turn on the lights when you go into a dark area. Replace any light bulbs as soon as they burn out. Set up your furniture so you have a clear path. Avoid moving your furniture around. If any of your floors are uneven, fix them. If there are any pets around you, be aware of where they are. Review your medicines with your doctor. Some medicines can make you feel dizzy. This can increase your chance of falling. Ask your doctor what other things that you can do to help prevent falls. This information is not intended to replace advice given to you by your  health care provider. Make sure you discuss any questions you have with your health care provider. Document Released: 12/24/2008 Document Revised: 08/05/2015 Document Reviewed: 04/03/2014 Elsevier Interactive Patient Education  2017 [employer].

## 2023-10-07 ENCOUNTER — Telehealth: Payer: Self-pay | Admitting: Pulmonary Disease

## 2023-10-07 DIAGNOSIS — G4733 Obstructive sleep apnea (adult) (pediatric): Secondary | ICD-10-CM | POA: Diagnosis not present

## 2023-10-07 NOTE — Telephone Encounter (Signed)
 Call patient  Sleep study result  Date of study: 10/02/2023  Impression: Mild obstructive sleep apnea with mild oxygen desaturations, AHI of 14.4 with O2 nadir of 81%  Recommendation: Continue to optimize amplitude of the inspire device May benefit from up titration of amplitude depending on symptoms

## 2023-10-08 NOTE — Telephone Encounter (Signed)
 Please make sure patient has a follow-up to review home sleep study results in person with inspire team

## 2023-10-09 ENCOUNTER — Encounter: Payer: Self-pay | Admitting: Internal Medicine

## 2023-10-09 NOTE — Progress Notes (Signed)
 Annual Wellness Visit   Patient Care Team: Lizbeth Feijoo, Ronal PARAS, MD as PCP - General (Internal Medicine) Lonni Slain, MD as PCP - Cardiology (Cardiology) Camillo Golas, MD as Attending Physician (Ophthalmology)  Visit Date: 10/09/23   Chief Complaint  Patient presents with  . Medicare Wellness  . Annual Exam   Subjective:  Patient: Dawn Herrera [facility], Female DOB: [birth year], 73 y.o. MRN: [id number] Dawn Herrera is a 73 y.o. Female who presents today for her Annual Wellness Visit. Patient has Vitamin-D Deficiency; Hx Of Adenomatous Colonic Polyps; Hyperlipidemia; Smoking History; OSA (Obstructive Sleep Apnea); Benign Microscopic Hematuria; Osteopenia; History of COPD; Mixed Hyperlipidemia; Nonocclusive Coronary Atherosclerosis Of Native Coronary Artery; Coronary Artery Calcification Seen On CT Scan; and Type 2 Diabetes Mellitus w/o Complication, w/o Long-Term Current Use Of Insulin.  History of Hyperlipidemia treated with Rosuvastatin  40 mg daily. 10/04/2023 Lipid Panel: Triglycerides 168; otherwise WNL. 03/2021 Coronary Calcium  Score: 7.63.   History of Diabetes Mellitus, type II with 10/04/2023 HgbA1c 6.8, elevated form 6.3 in 09/2022; Glucose 119, elevated from 116 in 09/2022. Followed by Dr. Odella Jacobson, Endocrinologist with [facility]. Eye exam 11/2022 did not see any diabetic retinopathy.   History of Glaucoma followed by Ophthalmology q6 months. Last eye exam on file 11/23/2022   History of Vitamin-D Deficiency treated with Vitamin-D 1000 units daily. Vitamin-D 10/02/2022: 80.  History of Obstructive Sleep Apnea s/p hypoglossal nerve stimulator implantation 01/2022 after failing CPAP. Previously she has said she does feel well rested, however is able to feel the device while moving around in bed.  History of Smoking 0.5 PPD x30 years, quit circa 2016 w/ annual CXR screenings. 07/24/2023 Chest CT noted in the Lungs/Pleura: No pleural fluid. Mild centrilobular emphysema.  Calcified granulomas of 2.1 mm. A left lower lobe nodule of 1.5 mm determined benign appearance or behavior w/ repeat recommendation of 2026.  Labs 10/04/2023 CBC: WNL C-MET: Glucose 119; otherwise WNL TSH: 2.62  No Hx of Abnormal PAP Smears.  Mammogram 10/17/2022 normal with repeat recommendation of 2025.  Colonoscopy 11/23/2017 removed one 18 mm semi-penduculated polyp from transverse colon (per pathology sessile serrated adenoma) and ten 3-12 semi-sessile/sessile polyps from rectum, sigmoid colon, and ascending colon (per pathology these polyps were found to be a mixture of a tubular adenoma, 5 hyperplastic polyps total, and polypoid colonic mucosa, negative for dysplasia); Medium-sized, Grade I (do not prolapse) Internal Hemorrhoids with repeat recommendation of 2029.  Bone Density 10/16/2022 Femur Neck Left T-score -0.4, normal.   Vaccine Counseling: UTD on Influenza, Shingrix 2nd dose, PNA, and Tdap. Outpatient Medications Prior to Visit  Medication Sig  . aspirin  EC 81 MG tablet Take 1 tablet (81 mg total) by mouth daily. Swallow whole.  . calcium  carbonate (OS-CAL) 600 MG TABS Take 600 mg by mouth daily.  . cholecalciferol (VITAMIN D ) 1000 UNITS tablet Take 1,000 Units by mouth daily.  . clobetasol ointment (TEMOVATE) 0.05 % Apply topically as needed.  . Melatonin 1 MG CAPS Take 2 mg by mouth at bedtime.  . mometasone (ELOCON) 0.1 % cream Apply topically as needed.  . Multiple Vitamins-Minerals (MULTIVITAMIN WITH MINERALS) tablet Take 1 tablet by mouth daily.  . rosuvastatin  (CRESTOR ) 40 MG tablet Take 1 tablet (40 mg total) by mouth daily.  . [DISCONTINUED] HYDROcodone  bit-homatropine (HYCODAN) 5-1.5 MG/5ML syrup Take 5 mLs by mouth every 8 (eight) hours as needed for cough.  . [DISCONTINUED] methylPREDNISolone  (MEDROL  DOSEPAK) 4 MG TBPK tablet Take in tapering course as directed 6-5-4-3-2-1   No facility-administered  medications prior to visit.   Past Medical History:  Diagnosis  Date  . Adenomatous polyp   . Allergic rhinitis   . Depression   . Hyperlipidemia   . Prediabetes   . Sleep apnea    wears mouth guard   Medical/Surgical History Narrative: copied from 10/03/2022 note.  Allergic/Intolerant to:  Allergies  Allergen Reactions  . Erythromycin Nausea Only  History of positional vertigo October 2015 that responded to Epley maneuver.   Remote history of left plantar fasciitis and has seen podiatrist at [facility].   Has seen Dr. Renay Moats for microscopic hematuria in 2007 with normal cystoscopy and ureteroscopy.   Saw Dr. Leonor in 2009 for impingement of right shoulder with mild adhesive capsulitis.   In June 2015 had an episode of epigastric pain and was diagnosed with H. pylori gastritis and was treated with 3 drug regimen.  Endoscopy showed duodenitis and gastritis along with hiatal hernia. Past Surgical History:  Procedure Laterality Date  . COLONOSCOPY  03/21/2010  . TUBAL LIGATION  2004   Family History  Problem Relation Age of Onset  . Heart disease Father   . Diabetes Other    Family History Narrative: Father, deceased age 19, w/ hx of Congestive Heart Failure and Diabetes Mother w/ hx of Coronary Artery Disease and MI s/p CABG Siblings, 1 brother and 4 sisters - Oldest Sister, Susie passed 09/27/23 following a fall and 2 compression fractures Unknown Relation w/ hx of Diabetes Social History   Social History Narrative   Working for [employer] a few days/ week. Has 2 adult daughters. Former smoker 0.5 PPD x30 years, quit circa 2016. Social alcohol consumption.   Review of Systems  Constitutional:  Negative for chills, fever, malaise/fatigue and weight loss.  HENT:  Negative for hearing loss, sinus pain and sore throat.   Respiratory:  Negative for cough, hemoptysis and shortness of breath.   Cardiovascular:  Negative for chest pain, palpitations, leg swelling and PND.  Gastrointestinal:  Negative for abdominal pain,  constipation, diarrhea, heartburn, nausea and vomiting.  Genitourinary:  Negative for dysuria, frequency and urgency.  Musculoskeletal:  Negative for back pain, myalgias and neck pain.  Skin:  Negative for itching and rash.  Neurological:  Negative for dizziness, tingling, seizures and headaches.  Endo/Heme/Allergies:  Negative for polydipsia.  Psychiatric/Behavioral:  Negative for depression. The patient is not nervous/anxious.     Objective:  Vitals: BP 120/70   Pulse 76   Ht 5' 3 (1.6 m)   Wt 159 lb (72.1 kg)   LMP 03/14/1995 (Approximate)   SpO2 95%   BMI 28.17 kg/m  Physical Exam Vitals and nursing note reviewed.  Constitutional:      General: She is not in acute distress.    Appearance: Normal appearance. She is not ill-appearing or toxic-appearing.  HENT:     Head: Normocephalic and atraumatic.     Right Ear: Hearing, tympanic membrane, ear canal and external ear normal.     Left Ear: Hearing, tympanic membrane, ear canal and external ear normal.     Mouth/Throat:     Pharynx: Oropharynx is clear.  Eyes:     Extraocular Movements: Extraocular movements intact.     Pupils: Pupils are equal, round, and reactive to light.  Neck:     Thyroid : No thyroid  mass, thyromegaly or thyroid  tenderness.     Vascular: No carotid bruit.  Cardiovascular:     Rate and Rhythm: Normal rate and regular rhythm. No extrasystoles  are present.    Pulses:          Dorsalis pedis pulses are 2+ on the right side and 2+ on the left side.     Heart sounds: Normal heart sounds. No murmur heard.    No friction rub. No gallop.  Pulmonary:     Effort: Pulmonary effort is normal.     Breath sounds: Normal breath sounds. No decreased breath sounds, wheezing, rhonchi or rales.  Chest:     Chest wall: No mass.  Abdominal:     Palpations: Abdomen is soft. There is no hepatomegaly, splenomegaly or mass.     Tenderness: There is no abdominal tenderness.     Hernia: No hernia is present.   Musculoskeletal:     Cervical back: Normal range of motion.     Right lower leg: No edema.     Left lower leg: No edema.  Lymphadenopathy:     Cervical: No cervical adenopathy.     Upper Body:     Right upper body: No supraclavicular adenopathy.     Left upper body: No supraclavicular adenopathy.  Skin:    General: Skin is warm and dry.  Neurological:     General: No focal deficit present.     Mental Status: She is alert and oriented to person, place, and time. Mental status is at baseline.     Sensory: Sensation is intact.     Motor: Motor function is intact. No weakness.     Deep Tendon Reflexes: Reflexes are normal and symmetric.  Psychiatric:        Attention and Perception: Attention normal.        Mood and Affect: Mood normal.        Speech: Speech normal.        Behavior: Behavior normal.        Thought Content: Thought content normal.        Cognition and Memory: Cognition normal.        Judgment: Judgment normal.   Most Recent Social Determinants of Health (Including Hx of Tobacco, Alcohol, and Drug Use): SDOH Screenings   Food Insecurity: No Food Insecurity (10/03/2023)  Housing: Unknown (10/05/2023)  Transportation Needs: No Transportation Needs (10/03/2023)  Utilities: Not At Risk (04/10/2023)  Alcohol Screen: Low Risk  (10/03/2023)  Depression (PHQ2-9): Low Risk  (10/03/2022)  Financial Resource Strain: Low Risk  (10/03/2023)  Physical Activity: Sufficiently Active (10/03/2023)  Social Connections: Moderately Integrated (10/03/2023)  Stress: No Stress Concern Present (10/03/2023)  Tobacco Use: Medium Risk (10/05/2023)  Health Literacy: Adequate Health Literacy (10/05/2023)   Social History   Tobacco Use  . Smoking status: Former    Current packs/day: 0.00    Average packs/day: 1 pack/day for 50.0 years (50.0 ttl pk-yrs)    Types: Cigarettes    Start date: 01/13/1963    Quit date: 01/12/2013    Years since quitting: 10.7    Passive exposure: Never  . Smokeless  tobacco: Never  Vaping Use  . Vaping status: Never Used  Substance Use Topics  . Alcohol use: Yes    Alcohol/week: 1.0 standard drink of alcohol    Types: 1 Standard drinks or equivalent per week  . Drug use: No   Most Recent Functional Status Assessment:    10/05/2023    2:45 PM  In your present state of health, do you have any difficulty performing the following activities:  Hearing? 0  Vision? 0  Difficulty concentrating or making decisions? 0  Walking  or climbing stairs? 0  Dressing or bathing? 0  Doing errands, shopping? 0  Preparing Food and eating ? N  Using the Toilet? N  In the past six months, have you accidently leaked urine? Y  Do you have problems with loss of bowel control? Y  Managing your Medications? N  Managing your Finances? N  Housekeeping or managing your Housekeeping? N   Most Recent Fall Risk Assessment:    10/05/2023    2:46 PM  Fall Risk   Falls in the past year? 0  Number falls in past yr: 0  Injury with Fall? 0  Risk for fall due to : No Fall Risks  Follow up Falls prevention discussed;Education provided;Falls evaluation completed   Most Recent Depression Screenings:    10/03/2022   11:18 AM 05/23/2022   12:01 PM  PHQ 2/9 Scores  PHQ - 2 Score 0 0   Most Recent Cognitive Screening:    10/05/2023    2:50 PM  6CIT Screen  What Year? 0 points  What month? 0 points  What time? 0 points  Count back from 20 0 points  Months in reverse 0 points  Repeat phrase 0 points  Total Score 0 points   Most Recent Eye/Vision Screening: No results found. Results:  Studies Obtained And Personally Reviewed By Me:  CT CHEST LUNG CA SCREEN LOW DOSE W/O CM 08/14/2023 COMPARISON:  07/21/2022  FINDINGS:  Cardiovascular: Aortic atherosclerosis. Normal heart size, without pericardial effusion. Three vessel coronary artery calcification.   Mediastinum/Nodes: No mediastinal or hilar adenopathy, given limitations of unenhanced CT.   Lungs/Pleura: No pleural  fluid. Mild centrilobular emphysema. Calcified granulomas of 2.1 mm. A left lower lobe nodule of 1.5 mm.   Upper Abdomen: Central left hepatic lobe 1.4 cm cyst. Normal imaged portions of the spleen, stomach, pancreas, gallbladder, adrenal glands, right kidney. An incompletely imaged interpolar left renal 2.1 cm fluid density lesion is likely a cyst . In the absence of clinically indicated signs/symptoms require(s) no independent follow-up. Musculoskeletal: Battery pack for deep brain or vagal stimulator in anterior right chest wall. No acute osseous abnormality.  IMPRESSION:  1. Lung-RADS 2, benign appearance or behavior. Continue annual screening with low-dose chest CT without contrast in 12 months.  2. Aortic Atherosclerosis (ICD10-I70.0) and Emphysema (ICD10-J43.9). Coronary artery atherosclerosis.   MM 3D SCREENING MAMMOGRAM BILATERAL BREAST 10/17/2022 COMPARISON:  Previous exam(s).  ACR Breast Density Category b: There are scattered areas of fibroglandular density.  FINDINGS: There are no findings suspicious for malignancy.  IMPRESSION: No mammographic evidence of malignancy.   DG Bone Density 10/16/2022 ASSESSMENT: The BMD measured at Femur Neck Left is 0.988 g/cm2 with a T-score of -0.4. This patient is considered normal according to World Health Organization [facility]) criteria. The scan quality is good.  T-score AP Spine L1-L4 Normal 1.4 1.347 g/cm2; DualFemur Neck Left Normal -0.4 0.988 g/cm2  Colonoscopy 11/23/2017 removed one 18 mm semi-penduculated polyp from transverse colon (per pathology sessile serrated adenoma) and ten 3-12 semi-sessile/sessile polyps from rectum, sigmoid colon, and ascending colon (per pathology these polyps were found to be a mixture of a tubular adenoma, 5 hyperplastic polyps total, and polypoid colonic mucosa, negative for dysplasia); Medium-sized, Grade I (do not prolapse) Internal Hemorrhoids.  03/2021 Coronary Calcium  Score: 7.63.  Labs:  CBC w/ Differential Lab  Results  Component Value Date   WBC 4.7 10/04/2023   RBC 5.07 10/04/2023   HGB 14.5 10/04/2023   HCT 44.9 10/04/2023   PLT 201 10/04/2023  MCV 88.6 10/04/2023   MCH 28.6 10/04/2023   MCHC 32.3 10/04/2023   RDW 13.6 10/04/2023   MPV 11.1 10/04/2023   LYMPHSABS 1,291 10/02/2022   MONOABS 564 09/26/2016   BASOSABS 9 10/04/2023    Comprehensive Metabolic Panel Lab Results  Component Value Date   NA 141 10/04/2023   K 4.7 10/04/2023   CL 106 10/04/2023   CO2 27 10/04/2023   GLUCOSE 119 (H) 10/04/2023   BUN 17 10/04/2023   CREATININE 0.99 10/04/2023   CALCIUM  9.9 10/04/2023   PROT 6.6 10/04/2023   ALBUMIN 4.2 09/26/2016   AST 27 10/04/2023   ALT 23 10/04/2023   ALKPHOS 104 09/26/2016   BILITOT 0.7 10/04/2023   EGFR 62 10/02/2022   GFRNONAA 61 09/02/2019   Lipid Panel  Lab Results  Component Value Date   CHOL 162 10/04/2023   HDL 59 10/04/2023   LDLCALC 76 10/04/2023   TRIG 168 (H) 10/04/2023   A1c Lab Results  Component Value Date   HGBA1C 6.8 (H) 10/04/2023    TSH Lab Results  Component Value Date   TSH 2.62 10/04/2023   Assessment & Plan:   Orders Placed This Encounter  Procedures  . Amb Referral to Colonoscopy    Referral Priority:   Routine    Referral Type:   Consultation    Referred to Provider:   Dianna Specking, MD    Requested Specialty:   Gastroenterology    Number of Visits Requested:   1  . POCT URINALYSIS DIP (CLINITEK)  Other Labs Reviewed today: CBC: WNL C-MET: Glucose 119; otherwise WNL TSH: 2.62  Hyperlipidemia treated with Rosuvastatin  40 mg daily. 10/04/2023 Lipid Panel: Triglycerides 168; otherwise WNL. 03/2021 Coronary Calcium  Score: 7.63.   Diabetes Mellitus, type II with 10/04/2023 HgbA1c 6.8, elevated form 6.3 in 09/2022; Glucose 119, elevated from 116 in 09/2022. Followed by Dr. Odella Jacobson, Endocrinologist with [facility]. Eye exam 11/2022 did not see any diabetic retinopathy.   Glaucoma followed by Ophthalmology q6  months. Last eye exam on file 11/23/2022   Vitamin-D Deficiency treated with Vitamin-D 1000 units daily. Vitamin-D 10/02/2022: 80.  Obstructive Sleep Apnea s/p hypoglossal nerve stimulator implantation 01/2022 after failing CPAP. Previously she has said she does feel well rested, however is able to feel the device while moving around in bed.  History of Smoking 0.5 PPD x30 years, quit circa 2016 w/ annual CXR screenings. 07/24/2023 Chest CT noted in the Lungs/Pleura: No pleural fluid. Mild centrilobular emphysema. Calcified granulomas of 2.1 mm. A left lower lobe nodule of 1.5 mm determined benign appearance or behavior w/ repeat recommendation of 2026.  No Hx of Abnormal PAP Smears.  Mammogram 10/17/2022 normal with repeat recommendation of 2025.  Colonoscopy 11/23/2017 removed one 18 mm semi-penduculated polyp from transverse colon (per pathology sessile serrated adenoma) and ten 3-12 semi-sessile/sessile polyps from rectum, sigmoid colon, and ascending colon (per pathology these polyps were found to be a mixture of a tubular adenoma, 5 hyperplastic polyps total, and polypoid colonic mucosa, negative for dysplasia); Medium-sized, Grade I (do not prolapse) Internal Hemorrhoids with repeat recommendation of 2029.  Bone Density 10/16/2022 Femur Neck Left T-score -0.4, normal.   Vaccine Counseling: UTD on Influenza, Shingrix 2nd dose, PNA, and Tdap.  Return in 6 months (on 04/07/2024) for 6 month labs, and then on 04/10/2024 for 6 month follow-up, or as needed.   Annual wellness visit done today including the all of the following: Reviewed patient's Family Medical History Reviewed and updated list  of patient's medical providers Assessment of cognitive impairment was done Assessed patient's functional ability Established a written schedule for health screening services Health Risk Assessent Completed and Reviewed  Discussed health benefits of physical activity, and encouraged her to engage in  regular exercise appropriate for her age and condition.    I,Emily Lagle,acting as a Neurosurgeon for Ronal JINNY Hailstone, MD.,have documented all relevant documentation on the behalf of Ronal JINNY Hailstone, MD,as directed by  Ronal JINNY Hailstone, MD while in the presence of Ronal JINNY Hailstone, MD.   I, Ronal JINNY Hailstone, MD, have reviewed all documentation for this visit. The documentation on 10/09/23 for the exam, diagnosis, procedures, and orders are all accurate and complete.

## 2023-10-10 ENCOUNTER — Encounter: Payer: Self-pay | Admitting: Internal Medicine

## 2023-10-10 NOTE — Telephone Encounter (Signed)
 Good Morning,   Mrs. Dawn Herrera has a follow-up scheduled with Beth for 9/9. Would you like me to schedule an earlier appointment? Please let me know thank you.

## 2023-10-10 NOTE — Telephone Encounter (Signed)
 Per Landry, it's okay to keep scheduled follow-up for 9/9. If patient experiences any issues before the appointment, she should give us  a call so we can schedule her to be seen sooner.

## 2023-11-16 ENCOUNTER — Telehealth: Payer: Self-pay

## 2023-11-16 NOTE — Telephone Encounter (Signed)
 Copied from CRM [id number]. Topic: Clinical - Lab/Test Results >> Nov 14, 2023  3:45 PM Rozanna G wrote: Reason for CRM: PT WANTS IF SHE NEEDS TO COME TO THE APPT FOR THE FOLLOW ON HER SLEEP STUDY. SHE IS NOT SURE ABOUT THE RESULTS FROM MY CHART.  I called and spoke to pt. I informed pt of Beth's note and pt verbalized understanding. NFN

## 2023-11-16 NOTE — Telephone Encounter (Signed)
 She should keep apt, she has mild sleep apnea May benefit from titration of inspire  Make sure sierrah is aware of her visit so an [place] team member will be present

## 2023-11-16 NOTE — Telephone Encounter (Signed)
 Thank you for the update. I reached out to Mrs. Bunny to confirm, and she stated she will be here for appointment on 9/9 @1600 . I also informed and updated the [employer] reps to be in attendance. Please advise if anything further is needed.

## 2023-11-16 NOTE — Telephone Encounter (Signed)
 Pt notified on VM to keep appt. Beth wanted you to be aware at this is [employer]

## 2023-11-16 NOTE — Telephone Encounter (Signed)
 Should patient keep appointment on 9/9

## 2023-11-20 ENCOUNTER — Encounter: Payer: Self-pay | Admitting: Primary Care

## 2023-11-20 ENCOUNTER — Telehealth: Payer: Self-pay | Admitting: Primary Care

## 2023-11-20 ENCOUNTER — Ambulatory Visit: Admitting: Primary Care

## 2023-11-20 VITALS — BP 126/64 | HR 79 | Temp 97.3°F | Ht 63.0 in | Wt 162.4 lb

## 2023-11-20 DIAGNOSIS — G4733 Obstructive sleep apnea (adult) (pediatric): Secondary | ICD-10-CM

## 2023-11-20 NOTE — Progress Notes (Signed)
 @Patient  ID: Dawn Herrera, female    DOB: [birth year], 73 y.o.   MRN: [id number]  No chief complaint on file.   Referring provider: Perri Ronal PARAS, MD  HPI: 73 year old female, former smoker quit in 2014.  Past medical history significant for OSA, coronary artery calcification, type 2 diabetes, osteopenia, hyperlipidemia, vitamin D  deficiency.   Previous [facility] pulmonary encounter  09/10/2023 Discussed the use of AI scribe software for clinical note transcription with the patient, who gave verbal consent to proceed.  History of Present Illness   Dawn Herrera is a 73 year old female with obstructive sleep apnea who presents for a six-month follow-up visit.  She has been using the Inspire device nightly at level three, corresponding to a 2.4 amplitude, and finds it beneficial, particularly in reducing snoring. Previously, she used CPAP for years but found it cumbersome. She monitors her snoring with an app and notes variability in snoring intensity, but it does not disrupt her sleep or wake her up.  She typically goes to bed around midnight, falls asleep quickly, and wakes up around 8:00 to 8:30 AM, getting approximately seven hours and forty-four minutes of sleep. She wakes once a night to use the bathroom and feels rested in the morning, though she takes a nap in a recliner around 2:00 PM daily.  She reports that the [facility] device moves, especially when she lies on her side, and itches. She has been concerned about the movement since the device was implanted in 2023 and has been using a sports bra to help stabilize it. She has not experienced any discomfort from the device's movement, but it is noticeable. She has not had a home sleep study since the device was implanted.  She is not waking up due to snoring or apnea events. She feels rested in the morning and does not experience discomfort from the device's movement.      Stimulation level : 2.2 V lower limit, upper  limit 2.6 V.  Current amplitude is 2.4 V (Level 3) 2.  Start delay is at 30 minutes, pause time 15 minutes, duration 8 hours.    Sleep sync compliance report 08/11/2023 - 09/09/2023 Total nights use 26/30 (87%); nights use greater than 4 hours 25/30 (83%) Average hours per night used 7 hours 44 minutes Average pauses per night 0.6 Current amplitude 2.4 (upper limit 2.5; lower limit 2.2 )   11/20/2023- Interim hx  Discussed the use of AI scribe software for clinical note transcription with the patient, who gave verbal consent to proceed.  History of Present Illness Dawn Herrera is a 73 year old female with obstructive sleep apnea who presents for an annual check-in and review of her Inspire device settings.  She recently underwent a home sleep study while using Inspire, which indicated mild residual apneas with 14.4 events per hour. She uses the [facility] device consistently, averaging 7 hours and 50 minutes of use per night at level three, corresponding to a 2.4 voltage setting (level 3). She feels more tired recently, attributing this to returning from a month-long trip.  She primarily sleeps on her side due to back discomfort upon waking, though she sometimes sleeps on her back and experiences snoring even when sitting in a recliner. She has not used a wedge pillow to elevate her head. She typically goes to bed around midnight and wakes up at 8 AM, occasionally getting up once during the night to use the bathroom. Her device is set for  8 hours with a 30-minute start delay, and she usually sleeps for 7 hours before it shuts off.  Her weight has remained stable at 164 pounds since her last titration study in April 2024. She is planning a trip to [place] next month and inquires about any potential issues with her device during travel.  She mentions that the [facility] device sometimes feels mild and questions its effectiveness, as she does not feel it as much as before. She has noticed a  gradual increase in weight but not significant. She also reports occasional itching at the site of the device, which she attributes to the device's movement, especially when lying down.   Sleep sync 10/20/23-11/18/23 Usage days 30/30 days (100%); Nights used > 4 hours 30/30 days (100%) Average hours used 7 hours 50 mins Pauses per night 0.1   Current amplitude 2.4 (upper limit 2.5; lower limit 2.2V )   Allergies  Allergen Reactions   Erythromycin Nausea Only    Immunization History  Administered Date(s) Administered   Fluad Quad(high Dose 65+) 12/31/2019, 01/05/2023   Influenza,inj,Quad PF,6+ Mos 11/13/2014, 12/12/2018   Influenza-Unspecified 12/11/2013, 12/06/2020, 01/04/2022   PFIZER(Purple Top)SARS-COV-2 Vaccination 06/12/2019, 07/14/2019   Pneumococcal Conjugate-13 06/25/2018   Pneumococcal Polysaccharide-23 09/18/2019   Pneumococcal-Unspecified 09/18/2019   Tdap 06/25/2005, 08/09/2021   Zoster Recombinant(Shingrix) 07/01/2021, 12/27/2021   Zoster, Live 04/10/2011    Past Medical History:  Diagnosis Date   Adenomatous polyp    Allergic rhinitis    Depression    Hyperlipidemia    Prediabetes    Sleep apnea    wears mouth guard    Tobacco History: Social History   Tobacco Use  Smoking Status Former   Current packs/day: 0.00   Average packs/day: 1 pack/day for 50.0 years (50.0 ttl pk-yrs)   Types: Cigarettes   Start date: 01/13/1963   Quit date: 01/12/2013   Years since quitting: 10.8   Passive exposure: Never  Smokeless Tobacco Never   Counseling given: Not Answered   Outpatient Medications Prior to Visit  Medication Sig Dispense Refill   aspirin  EC 81 MG tablet Take 1 tablet (81 mg total) by mouth daily. Swallow whole. 90 tablet 3   calcium  carbonate (OS-CAL) 600 MG TABS Take 600 mg by mouth daily.     cholecalciferol (VITAMIN D ) 1000 UNITS tablet Take 1,000 Units by mouth daily.     clobetasol ointment (TEMOVATE) 0.05 % Apply topically as needed.      Melatonin 1 MG CAPS Take 2 mg by mouth at bedtime.     mometasone (ELOCON) 0.1 % cream Apply topically as needed.     Multiple Vitamins-Minerals (MULTIVITAMIN WITH MINERALS) tablet Take 1 tablet by mouth daily.     rosuvastatin  (CRESTOR ) 40 MG tablet Take 1 tablet (40 mg total) by mouth daily. 90 tablet 3   No facility-administered medications prior to visit.   Review of Systems  Review of Systems   Physical Exam  LMP 03/14/1995 (Approximate)  Physical Exam Constitutional:      Appearance: Normal appearance. She is well-developed.  HENT:     Head: Normocephalic and atraumatic.     Mouth/Throat:     Mouth: Mucous membranes are moist.     Pharynx: Oropharynx is clear.     Comments: Slight deviation to the left with tongue protrusion with inspire  Eyes:     Pupils: Pupils are equal, round, and reactive to light.  Cardiovascular:     Rate and Rhythm: Normal rate and regular rhythm.  Heart sounds: Normal heart sounds. No murmur heard. Pulmonary:     Effort: Pulmonary effort is normal. No respiratory distress.     Breath sounds: Normal breath sounds. No wheezing or rhonchi.  Abdominal:     General: Bowel sounds are normal.     Palpations: Abdomen is soft.     Tenderness: There is no abdominal tenderness.  Musculoskeletal:        General: Normal range of motion.     Cervical back: Normal range of motion and neck supple.  Skin:    General: Skin is warm and dry.     Findings: No erythema or rash.  Neurological:     General: No focal deficit present.     Mental Status: She is alert and oriented to person, place, and time. Mental status is at baseline.  Psychiatric:        Mood and Affect: Mood normal.        Behavior: Behavior normal.        Thought Content: Thought content normal.        Judgment: Judgment normal.     Lab Results:  CBC    Component Value Date/Time   WBC 4.7 10/04/2023 0943   RBC 5.07 10/04/2023 0943   HGB 14.5 10/04/2023 0943   HCT 44.9  10/04/2023 0943   PLT 201 10/04/2023 0943   MCV 88.6 10/04/2023 0943   MCH 28.6 10/04/2023 0943   MCHC 32.3 10/04/2023 0943   RDW 13.6 10/04/2023 0943   LYMPHSABS 1,291 10/02/2022 0911   MONOABS 564 09/26/2016 0917   EOSABS 169 10/04/2023 0943   BASOSABS 9 10/04/2023 0943    BMET    Component Value Date/Time   NA 141 10/04/2023 0943   K 4.7 10/04/2023 0943   CL 106 10/04/2023 0943   CO2 27 10/04/2023 0943   GLUCOSE 119 (H) 10/04/2023 0943   BUN 17 10/04/2023 0943   CREATININE 0.99 10/04/2023 0943   CALCIUM  9.9 10/04/2023 0943   GFRNONAA 61 09/02/2019 0931   GFRAA 70 09/02/2019 0931    BNP No results found for: BNP  ProBNP No results found for: PROBNP  Imaging: No results found.   Assessment & Plan:   1. OSA (obstructive sleep apnea) (Primary)   Assessment and Plan Assessment & Plan Obstructive sleep apnea managed with hypoglossal nerve stimulation (Inspire device) Obstructive sleep apnea is managed with the Inspire device. She reports consistent use, averaging 7 hours and 50 minutes per night. A recent home sleep study while using Inspire at level 3 (2.4V) showed mild residual apneas with 14.4 events per hour, within the mild sleep apnea range. She reports feeling slightly more tired, possibly due to recent travel. The current device setting is at level 3 (2.4 voltage). Tongue motion assessment showed mild deviation to the left, consistent with previous findings. Wave form ran for 3 minutes. Her weight is stable. The Inspire device's battery will need replacement in 10 years. - Increase Inspire device setting to level 4 (2.5 voltage) and add a fifth level for potential future use. - Advise use of a wedge pillow to elevate the head and potentially improve sleep quality. - Schedule a follow-up visit in six weeks to assess her condition and determine if another sleep study is needed. - Instruct her to monitor for any soreness or discomfort and adjust the device  settings accordingly. - Advise her to bring her Inspire remote and medical ID card when traveling to [place].  Recording duration: 20 minutes  Almarie LELON Ferrari, NP 11/20/2023

## 2023-11-20 NOTE — Telephone Encounter (Signed)
 Patient needs follow-up in 6 weeks in person with [facility] team member

## 2023-11-20 NOTE — Patient Instructions (Signed)
  VISIT SUMMARY: Today, we reviewed your obstructive sleep apnea and the settings of your Inspire device. You recently had a home sleep study that showed mild residual apneas. We discussed your current device settings, your recent travel, and your upcoming trip to [place]. We also talked about your sleep habits and any issues you have been experiencing with the device.  YOUR PLAN: -OBSTRUCTIVE SLEEP APNEA: Obstructive sleep apnea is a condition where your airway becomes blocked during sleep, causing breathing pauses. Your condition is managed with the Inspire device, which stimulates the hypoglossal nerve to keep your airway open. We will increase your device setting to level 4 (2.5 voltage) and add a fifth level for future use. You should use a wedge pillow to elevate your head and potentially improve your sleep quality. We will have a virtual follow-up visit in six weeks to see how you are doing and decide if another sleep study is needed. Please monitor for any soreness or discomfort and adjust the device settings if necessary. Remember to bring your Inspire remote and medical ID card when you travel to [place].  Follow-up 6 weeks in person with Beth/Inspire celedonio will call to schedule)

## 2023-11-21 NOTE — Telephone Encounter (Signed)
 Follow-up with Dawn Herrera scheduled for Friday, 11/14. Contacted Mrs. Dawn Herrera she is aware, reminder printed and mailed. Nothing further needed.

## 2023-11-26 ENCOUNTER — Telehealth: Payer: Self-pay

## 2023-11-26 NOTE — Telephone Encounter (Signed)
 Please book her a video visit maybe 12:30pm? Thanks.  Copied from CRM [id number]. Topic: Clinical - Medication Question >> Nov 26, 2023  4:48 PM Dawn Herrera ORN wrote: Reason for CRM: Patient called. Requesting if the PCP would prescribe her some medication for her 12 day cruise to [place] on anything that the PCP would recommend.

## 2023-11-27 ENCOUNTER — Telehealth (INDEPENDENT_AMBULATORY_CARE_PROVIDER_SITE_OTHER): Admitting: Internal Medicine

## 2023-11-27 ENCOUNTER — Telehealth: Payer: Self-pay | Admitting: Internal Medicine

## 2023-11-27 DIAGNOSIS — Z7184 Encounter for health counseling related to travel: Secondary | ICD-10-CM

## 2023-11-27 MED ORDER — ONDANSETRON HCL 4 MG PO TABS: 4.0000 mg | ORAL_TABLET | Freq: Three times a day (TID) | ORAL | 0 refills | Status: AC | PRN

## 2023-11-27 MED ORDER — LEVOFLOXACIN 500 MG PO TABS: 500.0000 mg | ORAL_TABLET | Freq: Every day | ORAL | 0 refills | Status: AC

## 2023-11-27 NOTE — Progress Notes (Signed)
 Patient Care Team: Perri Ronal PARAS, MD as PCP - General (Internal Medicine) Lonni Slain, MD as PCP - Cardiology (Cardiology) Camillo Golas, MD as Attending Physician (Ophthalmology)  I connected with Dawn Herrera on 11/27/23 at 5:43 PM by video enabled telemedicine visit and verified that I am speaking with the correct person using two identifiers.   I discussed the limitations, risks, security and privacy concerns of performing an evaluation and management service by telemedicine and the availability of in-person appointments. I also discussed with the patient that there may be a patient responsible charge related to this service. The patient expressed understanding and agreed to proceed.   Other persons participating in the visit and their role in the encounter: Medical scribe, Nestora JAYSON Bathe  Patient's location: Home  Provider's location: Clinic   I provided 5 minutes of face-to-face video visit time during this encounter, and > 50% was spent counseling as documented under my assessment & plan.   Chief Complaint:    Subjective:    Patient ID: Dawn Herrera , Female    DOB: 1950-04-18, 73 y.o.    MRN: [id number]   73 y.o. Female presents today for office visit for medication for an upcomming trip. Patient has a past medical history of Hyperlipidemia, Diabetes Mellitus.  In November she is flying to [place], [place] and then going on a 14 day cruise around [place] and wanted medication incase she got sick overseas.    Vaccine counseling: Influenza vaccine due, declined Covid vaccine.        Past Medical History:  Diagnosis Date   Adenomatous polyp    Allergic rhinitis    Depression    Hyperlipidemia    Prediabetes    Sleep apnea    wears mouth guard     Family History  Problem Relation Age of Onset   Heart disease Father    Diabetes Other     Social History   Social History Narrative   Working for [employer] a few days/ week. Has  2 adult daughters. Former smoker 0.5 PPD x30 years, quit circa 2016. Social alcohol consumption.      ROS      Objective:   Vitals: LMP 03/14/1995 (Approximate)    Physical Exam    Results:      Labs:       Component Value Date/Time   NA 141 10/04/2023 0943   K 4.7 10/04/2023 0943   CL 106 10/04/2023 0943   CO2 27 10/04/2023 0943   GLUCOSE 119 (H) 10/04/2023 0943   BUN 17 10/04/2023 0943   CREATININE 0.99 10/04/2023 0943   CALCIUM  9.9 10/04/2023 0943   PROT 6.6 10/04/2023 0943   ALBUMIN 4.2 09/26/2016 0917   AST 27 10/04/2023 0943   ALT 23 10/04/2023 0943   ALKPHOS 104 09/26/2016 0917   BILITOT 0.7 10/04/2023 0943   GFRNONAA 61 09/02/2019 0931   GFRAA 70 09/02/2019 0931     Lab Results  Component Value Date   WBC 4.7 10/04/2023   HGB 14.5 10/04/2023   HCT 44.9 10/04/2023   MCV 88.6 10/04/2023   PLT 201 10/04/2023    Lab Results  Component Value Date   CHOL 162 10/04/2023   HDL 59 10/04/2023   LDLCALC 76 10/04/2023   TRIG 168 (H) 10/04/2023   CHOLHDL 2.7 10/04/2023    Lab Results  Component Value Date   HGBA1C 6.8 (H) 10/04/2023     Lab Results  Component Value Date  TSH 2.62 10/04/2023         Assessment & Plan:   In November she is flying to [place], [place] and then going on a 14 day cruise around [place] and wanted medication incase she got sick overseas.   Levaquin  500 mg daily for 7 days and Zofran  4 mg as needed for nausea every 8 hours as needed was prescribed.    Vaccine counseling: Influenza vaccine due, declined Covid vaccine.   Advised her to get Influenza vaccine before she travels. She will be coming on Friday to get her vaccine.     I,Makayla C Reid,acting as a scribe for Ronal JINNY Hailstone, MD.,have documented all relevant documentation on the behalf of Ronal JINNY Hailstone, MD,as directed by  Ronal JINNY Hailstone, MD while in the presence of Ronal JINNY Hailstone, MD.

## 2023-11-28 NOTE — Patient Instructions (Signed)
 Virtual visit today for travel advice with upcoming trip to [place]. Prescribed Zofran  4 mg for nausea if needed and Levaquin  500 mg daily for 7 days should a urinary infection or acute lower respiratory infection develop. Please pick these up at your local pharamacy now and hold on to them for upcoming travel.

## 2023-11-30 ENCOUNTER — Ambulatory Visit

## 2023-12-26 ENCOUNTER — Other Ambulatory Visit: Payer: Self-pay | Admitting: Internal Medicine

## 2024-01-25 ENCOUNTER — Ambulatory Visit: Admitting: Primary Care

## 2024-02-21 NOTE — Telephone Encounter (Signed)
 Done

## 2024-04-07 ENCOUNTER — Other Ambulatory Visit: Payer: Self-pay

## 2024-04-07 ENCOUNTER — Ambulatory Visit: Admitting: Internal Medicine

## 2024-04-10 ENCOUNTER — Ambulatory Visit: Payer: Self-pay | Admitting: Internal Medicine

## 2024-04-11 ENCOUNTER — Telehealth: Payer: Self-pay | Admitting: Primary Care

## 2024-04-11 NOTE — Telephone Encounter (Signed)
 Left voicemail on 03/19/2024 for patient to call back to schedule INSPIRE follow up. 1/30 left another voicemail and sent mychart message.

## 2024-04-24 ENCOUNTER — Other Ambulatory Visit

## 2024-04-28 ENCOUNTER — Ambulatory Visit: Admitting: Internal Medicine
